# Patient Record
Sex: Female | Born: 1935 | Race: Black or African American | Hispanic: No | Smoking: Never smoker
Health system: Southern US, Community
[De-identification: ages and names within clinical notes are randomized; demographics above are authoritative.]

## PROBLEM LIST (undated history)

## (undated) DIAGNOSIS — Z950 Presence of cardiac pacemaker: Secondary | ICD-10-CM

## (undated) DIAGNOSIS — I442 Atrioventricular block, complete: Secondary | ICD-10-CM

## (undated) DIAGNOSIS — IMO0001 Reserved for inherently not codable concepts without codable children: Secondary | ICD-10-CM

## (undated) DIAGNOSIS — I454 Nonspecific intraventricular block: Secondary | ICD-10-CM

## (undated) DIAGNOSIS — K219 Gastro-esophageal reflux disease without esophagitis: Secondary | ICD-10-CM

## (undated) DIAGNOSIS — Z531 Procedure and treatment not carried out because of patient's decision for reasons of belief and group pressure: Secondary | ICD-10-CM

## (undated) HISTORY — PX: HERNIA REPAIR: SHX51

## (undated) HISTORY — PX: NO PAST SURGERIES: SHX2092

---

## 2012-03-18 ENCOUNTER — Encounter (HOSPITAL_COMMUNITY): Payer: Self-pay | Admitting: Family

## 2012-03-18 ENCOUNTER — Inpatient Hospital Stay (HOSPITAL_COMMUNITY)
Admission: AD | Admit: 2012-03-18 | Discharge: 2012-03-19 | DRG: 243 | Disposition: A | Discharge status: Home / Self Care | Payer: Medicare Other | Source: Other Acute Inpatient Hospital | Attending: Cardiology | Admitting: Cardiology

## 2012-03-18 ENCOUNTER — Encounter (HOSPITAL_COMMUNITY)
Admission: AD | Disposition: A | Discharge status: Home / Self Care | Payer: Self-pay | Source: Other Acute Inpatient Hospital | Attending: Cardiology

## 2012-03-18 DIAGNOSIS — I442 Atrioventricular block, complete: Principal | ICD-10-CM

## 2012-03-18 DIAGNOSIS — I454 Nonspecific intraventricular block: Secondary | ICD-10-CM

## 2012-03-18 DIAGNOSIS — Z950 Presence of cardiac pacemaker: Secondary | ICD-10-CM

## 2012-03-18 DIAGNOSIS — E46 Unspecified protein-calorie malnutrition: Secondary | ICD-10-CM | POA: Diagnosis present

## 2012-03-18 DIAGNOSIS — K219 Gastro-esophageal reflux disease without esophagitis: Secondary | ICD-10-CM | POA: Diagnosis present

## 2012-03-18 DIAGNOSIS — Z79899 Other long term (current) drug therapy: Secondary | ICD-10-CM

## 2012-03-18 DIAGNOSIS — I447 Left bundle-branch block, unspecified: Secondary | ICD-10-CM | POA: Diagnosis present

## 2012-03-18 DIAGNOSIS — Z681 Body mass index (BMI) 19 or less, adult: Secondary | ICD-10-CM

## 2012-03-18 HISTORY — PX: PERMANENT PACEMAKER INSERTION: SHX5480

## 2012-03-18 HISTORY — DX: Nonspecific intraventricular block: I45.4

## 2012-03-18 HISTORY — DX: Procedure and treatment not carried out because of patient's decision for reasons of belief and group pressure: Z53.1

## 2012-03-18 HISTORY — DX: Reserved for inherently not codable concepts without codable children: IMO0001

## 2012-03-18 HISTORY — DX: Atrioventricular block, complete: I44.2

## 2012-03-18 HISTORY — DX: Gastro-esophageal reflux disease without esophagitis: K21.9

## 2012-03-18 HISTORY — DX: Presence of cardiac pacemaker: Z95.0

## 2012-03-18 HISTORY — PX: PACEMAKER INSERTION: SHX728

## 2012-03-18 LAB — CARDIAC PANEL(CRET KIN+CKTOT+MB+TROPI)
CK, MB: 6.5 ng/mL (ref 0.3–4.0)
CK, MB: 8.4 ng/mL (ref 0.3–4.0)
Total CK: 82 U/L (ref 7–177)
Troponin I: <0.3 ng/mL (ref <0.30)
Troponin I: <0.3 ng/mL (ref <0.30)

## 2012-03-18 LAB — MAGNESIUM: Magnesium: 1.9 mg/dL (ref 1.5–2.5)

## 2012-03-18 LAB — PACEMAKER DEVICE OBSERVATION

## 2012-03-18 LAB — T3: T3, Total: 93.5 ng/dl (ref 80.0–204.0)

## 2012-03-18 SURGERY — PERMANENT PACEMAKER INSERTION
Anesthesia: LOCAL

## 2012-03-18 MED ORDER — PANTOPRAZOLE SODIUM 40 MG PO TBEC
40.0000 mg | DELAYED_RELEASE_TABLET | Freq: Every day | ORAL | Status: DC
  Administered 2012-03-18: 23:00:00 40 mg via ORAL
  Filled 2012-03-18: qty 1

## 2012-03-18 MED ORDER — ONDANSETRON HCL 4 MG/2ML IJ SOLN: 4.0000 mg | Freq: Four times a day (QID) | INTRAMUSCULAR | Status: DC | PRN

## 2012-03-18 MED ORDER — ACETAMINOPHEN 325 MG PO TABS: 650.0000 mg | ORAL_TABLET | ORAL | Status: DC | PRN

## 2012-03-18 MED ORDER — SODIUM CHLORIDE 0.9 % IV SOLN: 250.0000 mL | INTRAVENOUS | Status: DC

## 2012-03-18 MED ORDER — MIDAZOLAM HCL 5 MG/5ML IJ SOLN
INTRAMUSCULAR | Status: AC
  Filled 2012-03-18: qty 5

## 2012-03-18 MED ORDER — CEFAZOLIN SODIUM 1-5 GM-% IV SOLN
INTRAVENOUS | Status: AC
  Administered 2012-03-18: 15:00:00
  Filled 2012-03-18: qty 100

## 2012-03-18 MED ORDER — CHLORHEXIDINE GLUCONATE 4 % EX LIQD: 60.0000 mL | Freq: Once | CUTANEOUS | Status: DC

## 2012-03-18 MED ORDER — FENTANYL CITRATE 0.05 MG/ML IJ SOLN
INTRAMUSCULAR | Status: AC
  Filled 2012-03-18: qty 2

## 2012-03-18 MED ORDER — ACETAMINOPHEN 325 MG PO TABS
ORAL_TABLET | ORAL | Status: AC
  Filled 2012-03-18: qty 1

## 2012-03-18 MED ORDER — ACETAMINOPHEN 325 MG PO TABS
325.0000 mg | ORAL_TABLET | ORAL | Status: DC | PRN
  Administered 2012-03-18: 325 mg via ORAL
  Filled 2012-03-18: qty 2

## 2012-03-18 MED ORDER — SODIUM CHLORIDE 0.9 % IJ SOLN: 3.0000 mL | INTRAMUSCULAR | Status: DC | PRN

## 2012-03-18 MED ORDER — SODIUM CHLORIDE 0.45 % IV SOLN: INTRAVENOUS | Status: DC

## 2012-03-18 MED ORDER — SODIUM CHLORIDE 0.9 % IJ SOLN
3.0000 mL | Freq: Two times a day (BID) | INTRAMUSCULAR | Status: DC
  Administered 2012-03-18: 3 mL via INTRAVENOUS

## 2012-03-18 MED ORDER — CEFAZOLIN SODIUM-DEXTROSE 2-3 GM-% IV SOLR
2.0000 g | INTRAVENOUS | Status: DC
  Filled 2012-03-18: qty 50

## 2012-03-18 MED ORDER — SODIUM CHLORIDE 0.9 % IR SOLN
80.0000 mg | Status: DC
  Filled 2012-03-18: qty 2

## 2012-03-18 MED ORDER — OMEPRAZOLE MAGNESIUM 20 MG PO TBEC: 20.0000 mg | DELAYED_RELEASE_TABLET | Freq: Every day | ORAL | Status: DC

## 2012-03-18 MED ORDER — LIDOCAINE HCL (PF) 1 % IJ SOLN
INTRAMUSCULAR | Status: AC
  Filled 2012-03-18: qty 60

## 2012-03-18 MED ORDER — CEFAZOLIN SODIUM-DEXTROSE 2-3 GM-% IV SOLR
2.0000 g | Freq: Four times a day (QID) | INTRAVENOUS | Status: DC
  Administered 2012-03-18 – 2012-03-19 (×2): 2 g via INTRAVENOUS
  Filled 2012-03-18 (×3): qty 50

## 2012-03-18 MED ORDER — POTASSIUM CHLORIDE 10 MEQ/100ML IV SOLN
10.0000 meq | Freq: Once | INTRAVENOUS | Status: AC
  Administered 2012-03-18: 10 meq via INTRAVENOUS
  Filled 2012-03-18: qty 100

## 2012-03-18 NOTE — Care Management Note (Signed)
    Page 1 of 1   03/18/2012     11:45:41 AM   CARE MANAGEMENT NOTE 03/18/2012  Patient:  JETAIME, PINNIX   Account Number:  [id number]  Date Initiated:  03/18/2012  Documentation initiated by:  Junius Creamer  Subjective/Objective Assessment:   adm w brady rate in 20-30's     Action/Plan:   lives w da   Anticipated DC Date:     Anticipated DC Plan:        DC Planning Services  CM consult      Choice offered to / List presented to:             Status of service:   Medicare Important Message given?   (If response is "NO", the following Medicare IM given date fields will be blank) Date Medicare IM given:   Date Additional Medicare IM given:    Discharge Disposition:    Per UR Regulation:  Reviewed for med. necessity/level of care/duration of stay  If discussed at Long Length of Stay Meetings, dates discussed:    Comments:  7/18 11:42a debbie Aariyah Sampey rn,bsn [phone]

## 2012-03-18 NOTE — Op Note (Signed)
DDD PPM insertion via the left subclavian vein without immediate complication. Dictated.

## 2012-03-18 NOTE — H&P (Signed)
ELECTROPHYSIOLOGY ADMISSION HISTORY & PHYSICAL  Patient ID: Betty [name] MRN: [id number], DOB/AGE: 76-Oct-1916   Date of Admission: 03/18/2012  Primary Physician: None Primary Cardiologist: New to  Reason for Admission: Complete heart block  History of Present Illness Betty [name] is a pleasant 78 year old woman with no prior cardiac history who has been accepted in transfer from [facility] for further evaluation of bradycardia. She is accompanied by her daughter, with whom she lives, who assists with history questions. Betty [name] denies any chronic medical conditions, specifically CAD/MI, valvular heart disease, CHF, thyroid dysfunction or prior heart rhythm abnormality. She was feeling like her usual self until ~3:00 AM today. She reports she was getting ready for bed (she often stays up late at night) when she felt weak. Her symptoms started abruptly and were brief in duration. She denies CP, SOB or palpitations. She denies syncope. She has never experienced symptoms similar to this before. She denies any exertional symptoms, specifically CP, SOB, weakness/dizziness or palpitations. She called for her daughter who activated EMS. According to the report from [facility], EMS found her to be bradycardic with rates in the 20s-30s. EMS strips show complete heart block. Currently, she is in sinus rhythm with PACs. She has a LBBB, ? new. There are no old ECGs available for comparison. Currently, she has no complaints and states she is feeling like her usual self again.  Past Medical History 1. GERD with intermittent dysphagia 2. Umbilical hernia  Past Surgical History None    Allergies/Intolerances No known drug or food allergies  Home Medications Prilosec OTC daily Vitamin C daily Vitamin B12 daily Vitamin B6 daily  Family History Positive for CAD   Social History Denies tobacco or alcohol use. Lives with daughter. Performs ADLs independently.   Review of  Systems General: No chills, fever, night sweats or unexplained weight changes.  Cardiovascular: No chest pain, dyspnea on exertion, edema, orthopnea, palpitations, paroxysmal nocturnal dyspnea. Dermatological: No rash, lesions or masses. Respiratory: No cough, dyspnea. Urologic: No hematuria, dysuria. Abdominal: Positive for intermittent dysphagia "feels like food gets stuck", no choking sensation.No nausea, vomiting, diarrhea, bright red blood per rectum, melena, or hematemesis. Neurologic: No visual changes, weakness, changes in mental status. All other systems reviewed and are otherwise negative except as noted above.  Physical Exam Blood pressure 153/64, pulse 83, temperature 98.2 F (36.8 C), temperature source Oral, resp. rate 18, SpO2 99.00%.  General: Well developed, well appearing, thin, elderly 78 year old female in no acute distress. HEENT: Normocephalic, atraumatic. EOMs intact. Sclera nonicteric. Oropharynx clear.  Neck: Supple without bruits. No JVD. Lungs:  Respirations regular and unlabored, CTA bilaterally. No wheezes, rales or rhonchi. Heart: Occasionally irregular. S1, S2 present. No murmurs, rub, S3 or S4. Abdomen: Soft, non-tender, non-distended. BS present x 4 quadrants. No hepatosplenomegaly.  Extremities: No clubbing, cyanosis or edema. DP/PT/Radials 2+ and equal bilaterally. Psych: Normal affect. Neuro: Alert and oriented X 3. Moves all extremities spontaneously. No focal deficits.   Labs Labs from [facility] - WBC 7000, Hgb 11.6, Hct 35, PLT 201,000 Sodium 143, potassium 3.2, chloride 105, bicarbonate 30, BUN 18, creatinine 1.1  CK 68, CK-MB 3.9, troponin 0.03 TSH 10.54  Radiology/Studies No results found  Echocardiogram pending 12-lead ECG shows normal sinus rhythm with LBBB, PACs Telemetry currently shows sinus rhythm with frequent PACs, underlying LBBB  Assessment and Plan 1. Complete heart block 2. LBBB 3. Elevated TSH, mild 4.  Intermittent dysphagia  Betty [name] will be admitted to CCU  with routine labs obtained including complete thyroid panel. We will order an echocardiogram to evaluate heart structure/valves and wall motion. We will place transcutaneous pacer pads and monitor rhythm. Her LBBB may or may not be new. There are no old ECGs available for review. She does not report any symptoms concerning for angina, ? need for ischemic evaluation. She will need a dual chamber PPM. Risks, benefits and alternatives to PPM implantation were discussed in detail with the patient today. These risks include, but are not limited to, bleeding, infection, pneumothorax, perforation, tamponade, vascular damage, renal failure, lead dislodgement, MI, stroke and death. The patient expressed verbal understanding and agrees with this plan of care. Of note, she was instructed to establish care with PCP for follow-up and further evaluation for dysphagia.   The patient was seen, examined and plan of care formulated with Dr. Lewayne Bunting. Signed, Rick Duff, PA-C 03/18/2012, 10:17 AM   EP Attending  Patient seen and examined. I agree with the exam, assess, and plan. She has intermittent symptomatic complete heart block. I have discussed the risks/benefits/goals/expectations of PPM with the patient and her family and she wishes to proceed.   Lewayne Bunting, [name]

## 2012-03-18 NOTE — Progress Notes (Signed)
CRITICAL VALUE ALERT  Critical value received:  CKMB 6.5  Date of notification:  03/18/2012  Time of notification:  1220  Critical value read back:yes  Nurse who received alert:  Malachy Chamber, RN   MD notified (1st page):   Time of first page:   MD notified (2nd page):  Time of second page:  Responding MD:    Time MD responded:

## 2012-03-18 NOTE — Interval H&P Note (Signed)
History and Physical Interval Note:  03/18/2012 1:54 PM  Betty [name]  has presented today for surgery, with the diagnosis of heart block  The various methods of treatment have been discussed with the patient and family. After consideration of risks, benefits and other options for treatment, the patient has consented to  Procedure(s) (LRB): PERMANENT PACEMAKER INSERTION (N/A) as a surgical intervention .  The patient's history has been reviewed, patient examined, no change in status, stable for surgery.  I have reviewed the patients' chart and labs.  Questions were answered to the patient's satisfaction.     Buel Ream.D.

## 2012-03-19 ENCOUNTER — Inpatient Hospital Stay (HOSPITAL_COMMUNITY): Payer: Medicare Other

## 2012-03-19 ENCOUNTER — Encounter: Payer: Self-pay | Admitting: *Deleted

## 2012-03-19 DIAGNOSIS — Z950 Presence of cardiac pacemaker: Secondary | ICD-10-CM | POA: Insufficient documentation

## 2012-03-19 DIAGNOSIS — I442 Atrioventricular block, complete: Principal | ICD-10-CM

## 2012-03-19 LAB — BASIC METABOLIC PANEL
BUN: 13 mg/dL (ref 6–23)
Chloride: 102 mEq/L (ref 96–112)
Creatinine, Ser: 0.7 mg/dL (ref 0.50–1.10)
GFR calc Af Amer: 83 mL/min — ABNORMAL LOW (ref >90)

## 2012-03-19 MED ORDER — YOU HAVE A PACEMAKER BOOK
Freq: Once | Status: AC
  Administered 2012-03-19: 08:00:00
  Filled 2012-03-19: qty 1

## 2012-03-19 MED ORDER — CHLORHEXIDINE GLUCONATE CLOTH 2 % EX PADS: 6.0000 | MEDICATED_PAD | Freq: Every day | CUTANEOUS | Status: DC

## 2012-03-19 MED ORDER — MUPIROCIN 2 % EX OINT
1.0000 "application " | TOPICAL_OINTMENT | Freq: Two times a day (BID) | CUTANEOUS | Status: DC
  Filled 2012-03-19: qty 22

## 2012-03-19 NOTE — Discharge Summary (Addendum)
   ELECTROPHYSIOLOGY DISCHARGE SUMMARY    Patient ID: Betty [name],  MRN: [id number], DOB/AGE: October 17, 1914 78 y.o.  Admit date: 03/18/2012 Discharge date: 03/19/2012  Primary Care Physician: None Primary Cardiologist: Dr. Lewayne Bunting  Primary Discharge Diagnosis:  1. Complete heart block s/p PPM implantation  Secondary Discharge Diagnoses:  1. GERD with intermittent dysphagia  Procedures This Admission:  1. Dual chamber PPM implantation Medtronic model 5076, 52 cm active fixation pacing lead, serial #[id number] was advanced into the right ventricle and the Medtronic model 5076, 45 cm active fixation pacing lead, serial #[id number] was advanced to the right atrium. Medtronic Adapta dual-chamber pacemaker serial [id number].  History and Hospital Course:  Betty [name] is a pleasant 78 year old woman with no prior cardiac history who was admitted yesterday with symptomatic, intermittent complete heart block. There were no reversible causes identified. Her cardiac enzymes were negative. Her thyroid studies were normal. She underwent dual chamber PPM implantation yesterday 07/198/2013. The patient tolerated this procedure well without any immediate complication. She remains hemodynamically stable and afebrile. Her chest xray shows stable lead placement without pneumothorax. Her device interrogation shows normal PPM function with stable lead parameters/measurements. Her implant site is intact without significant bleeding or hematoma. She has been given discharge instructions including wound care and activity restrictions. She will follow-up in 10 days for wound check. There were no changes made to her medications. Of note, she was instructed to establish care with PCP for follow-up and further evaluation for dysphagia. She has been seen, examined and deemed stable for discharge today by Dr. Berton Mount.  Physical Exam: Vitals: Blood pressure 101/57, pulse 71, temperature 98.3 F (36.8 C), temperature  source Oral, resp. rate 18, height 5\' 1"  (1.549 m), weight 92 lb 2.4 oz (41.8 kg), SpO2 98.00%.  General: Well developed, well appearing, elderly 78 year old female in no distress Heart: RRR. S1, S2 without murmur, rub or S3 Lungs: CTA bilaterally without wheeze, rales or rhonchi Extremities: No cyanosis, clubbing or edema Skin: Implant site/left upper chest intact without significant bleeding or hematoma  Labs:    Lab 03/19/12 0642  NA 139  K 3.4*  CL 102  CO2 25  BUN 13  CREATININE 0.70  CALCIUM 9.0  PROT --  BILITOT --  ALKPHOS --  ALT --  AST --  GLUCOSE 186*   Lab Results  Component Value Date   CKTOTAL 172 03/18/2012   CKMB 8.4* 03/18/2012   TROPONINI <0.30 03/18/2012     Disposition:  The patient is being discharged in stable condition.  Follow-up: Follow-up Information    Follow up with Lewayne Bunting, MD on 06/29/2012. (At 11:30 AM)    Contact information:   [facility] office [address]  [address] [place] [address] [phone]    Follow up with [facility] on 03/31/2012. (At 2:00 PM for wound check)    Contact information:   [facility] office [address]  [address] [place] [address] [phone]   Discharge Medications:  Medication List  As of 03/19/2012  8:46 AM   TAKE these medications         omeprazole 20 MG tablet   Commonly known as: PRILOSEC OTC   Take 20 mg by mouth daily.      Duration of Discharge Encounter: Greater than 30 minutes including physician time.  Limmie Patricia, PA-C 03/19/2012, 8:46 AM Sherryl Manges, MD 03/19/2012 8:48 AM

## 2012-03-19 NOTE — Op Note (Signed)
NAMESEQUOIA, WITZ NO.:  [id number]  MEDICAL RECORD NO.:  [id number]  LOCATION:  6529                         FACILITY:  [facility]  PHYSICIAN:  Doylene Canning. Ladona Ridgel, MD    DATE OF BIRTH:  1915/01/15  DATE OF PROCEDURE:  03/18/2012 DATE OF DISCHARGE:                              OPERATIVE REPORT   PROCEDURE PERFORMED:  Insertion of a dual-chamber pacemaker.  INDICATION:  Symptomatic intermittent complete heart block.  INTRODUCTION:  The patient is a very pleasant 78 year old woman with a history of symptomatic bradycardia and left bundle-branch block.  She developed complete heart block with symptoms and was admitted to the hospital for additional evaluation and treatment.  She is on no AV nodal blocking drugs.  DESCRIPTION OF PROCEDURE:  After informed consent was obtained, the patient was taken to the diagnostic EP Lab in a fasting state.  After usual preparation and draping, intravenous fentanyl and midazolam were given for sedation.  A 30 mL of lidocaine was infiltrated into the left infraclavicular region and a 5-cm incision was carried out over this region.  Electrocautery was utilized to dissect down to the fascial plane.  Initial attempts to puncture the subclavian vein were unsuccessful.  A 10 mL of contrast was injected into the left subclavian vein, which demonstrated a very small vein.  It was then successfully punctured and a Medtronic model 5076, 52 cm active fixation pacing lead, serial #[id number] was advanced into the right ventricle and the Medtronic model 5076, 45 cm active fixation pacing lead, serial #[id number] was advanced to the right atrium.  Mapping was carried out in the right ventricle.  At the final site, the R-waves measured 20 mV and the pacing threshold was a V at 0.5 msec.  Pacing impedance was 700 ohms.  With the ventricular lead in satisfactory position, attention then turned to placement of atrial lead.  It was placed in  anterolateral portion of the right atrium where P-waves measured 3 mV and pacing impedance was 600 ohms.  The threshold was a V at 0.5 msec.  Prominent injury current was present with both atrial and ventricular lead insertion and 10 V pacing did not stimulate the diaphragm, and either the atrium or the ventricle.  With these satisfactory parameters, the leads were secured to the subpectoral fascia with a figure-of-eight silk suture.  The sewing sleeve was secured with silk suture.  At this point, electrocautery was utilized to dissect down to the subpectoral pocket. It should be also noted that blunt dissection was utilized in conjunction with electrocautery.  After this was accomplished, the Medtronic adapter dual-chamber pacemaker serial [id number] was connected to the atrial and ventricular leads and placed in the subpectoralis muscle pocket.  The pocket was irrigated.  The muscular and subcuticular and subcu layers were reapproximated with silk suture. Benzoin and Steri-Strips were painted on the skin, a pressure dressing applied.  The patient was returned to her room in satisfactory condition.  COMPLICATIONS:  There were no immediate complications.  RESULTS:  Demonstrate successful implantation of a Medtronic dual- chamber pacemaker in a patient with symptomatic bradycardia.     Doylene Canning. Ladona Ridgel, MD  GWT/MEDQ  D:  03/18/2012  T:  03/19/2012  Job:  [id number]

## 2012-03-31 ENCOUNTER — Ambulatory Visit: Payer: Medicare Other

## 2012-03-31 ENCOUNTER — Ambulatory Visit (INDEPENDENT_AMBULATORY_CARE_PROVIDER_SITE_OTHER): Payer: Medicare Other | Admitting: Cardiology

## 2012-03-31 ENCOUNTER — Encounter: Payer: Self-pay | Admitting: Cardiology

## 2012-03-31 ENCOUNTER — Encounter: Payer: Self-pay | Admitting: Internal Medicine

## 2012-03-31 VITALS — BP 132/77 | HR 73 | Ht 60.0 in | Wt 75.0 lb

## 2012-03-31 DIAGNOSIS — Z950 Presence of cardiac pacemaker: Secondary | ICD-10-CM

## 2012-03-31 DIAGNOSIS — I442 Atrioventricular block, complete: Secondary | ICD-10-CM

## 2012-03-31 DIAGNOSIS — R0989 Other specified symptoms and signs involving the circulatory and respiratory systems: Secondary | ICD-10-CM

## 2012-03-31 LAB — PACEMAKER DEVICE OBSERVATION
AL AMPLITUDE: 2.8 mv
ATRIAL PACING PM: 13.8
BATTERY VOLTAGE: 2.8 V
VENTRICULAR PACING PM: 0.1

## 2012-03-31 NOTE — Progress Notes (Signed)
Wound check only.  See PaceArt report.

## 2012-04-21 ENCOUNTER — Encounter: Payer: Self-pay | Admitting: Internal Medicine

## 2012-06-24 ENCOUNTER — Encounter: Payer: Medicare Other | Admitting: Internal Medicine

## 2012-06-29 ENCOUNTER — Encounter: Payer: Self-pay | Admitting: Internal Medicine

## 2012-06-29 ENCOUNTER — Ambulatory Visit (INDEPENDENT_AMBULATORY_CARE_PROVIDER_SITE_OTHER): Payer: Medicare Other | Admitting: Internal Medicine

## 2012-06-29 VITALS — BP 119/62 | HR 78 | Ht 61.0 in | Wt 95.0 lb

## 2012-06-29 DIAGNOSIS — I441 Atrioventricular block, second degree: Secondary | ICD-10-CM

## 2012-06-29 DIAGNOSIS — Z950 Presence of cardiac pacemaker: Secondary | ICD-10-CM

## 2012-06-29 LAB — PACEMAKER DEVICE OBSERVATION
AL AMPLITUDE: 4 mv
AL THRESHOLD: 0.5 V
BAMS-0001: 150 {beats}/min
RV LEAD AMPLITUDE: 31.36 mv
RV LEAD IMPEDENCE PM: 463 Ohm

## 2012-06-29 NOTE — Progress Notes (Signed)
HPI Betty [name] returns today for followup. She is a very pleasant 78 year old woman with a history of complete heart block, status post permanent pacemaker insertion. The patient notes that she has some soreness at her insertion site for a couple of weeks after her pacemaker placement. This is now resolved, and she feels like she has no device in whatsoever. She has had no recurrent syncope. She denies chest pain or shortness of breath or peripheral edema. No Known Allergies   Current Outpatient Prescriptions  Medication Sig Dispense Refill  . vitamin C (ASCORBIC ACID) 500 MG tablet Take 500 mg by mouth daily.         Past Medical History  Diagnosis Date  . GERD (gastroesophageal reflux disease)   . Complete AV block 03/18/12  . BBB (bundle branch block) 03/18/12    left  . Pacemaker   . Refusal of blood transfusions as patient is Jehovah's Witness     ROS:   All systems reviewed and negative except as noted in the HPI.   Past Surgical History  Procedure Date  . No past surgeries   . Pacemaker insertion 03/18/12    initial placement     No family history on file.   History   Social History  . Marital Status: Widowed    Spouse Name: N/A    Number of Children: N/A  . Years of Education: N/A   Occupational History  . Not on file.   Social History Main Topics  . Smoking status: Never Smoker   . Smokeless tobacco: Never Used  . Alcohol Use: No  . Drug Use: No  . Sexually Active: No   Other Topics Concern  . Not on file   Social History Narrative  . No narrative on file     BP 119/62  Pulse 78  Ht 5\' 1"  (1.549 m)  Wt 95 lb (43.092 kg)  BMI 17.95 kg/m2  SpO2 99%  Physical Exam:  Well appearing elderly woman, NAD HEENT: Unremarkable Neck:  No JVD, no thyromegally Lungs:  Clear with no wheezes, rales, or rhonchi. HEART:  Regular rate rhythm, no murmurs, no rubs, no clicks Abd:  soft, positive bowel sounds, no organomegally, no rebound, no  guarding Ext:  2 plus pulses, no edema, no cyanosis, no clubbing Skin:  No rashes no nodules Neuro:  CN II through XII intact, motor grossly intact  DEVICE  Normal device function.  See PaceArt for details.   Assess/Plan:

## 2012-06-29 NOTE — Assessment & Plan Note (Signed)
Her Medtronic dual-chamber pacemaker is working normally. We'll plan to recheck in several months. 

## 2012-06-29 NOTE — Patient Instructions (Signed)
Your physician wants you to follow-up in: 9 months with Dr Taylor.  You will receive a reminder letter in the mail two months in advance. If you don't receive a letter, please call our office to schedule the follow-up appointment.  

## 2012-06-29 NOTE — Assessment & Plan Note (Signed)
She has become asymptomatic since her pacemaker was placed.

## 2013-02-08 ENCOUNTER — Encounter (HOSPITAL_COMMUNITY): Payer: Self-pay | Admitting: *Deleted

## 2013-02-08 ENCOUNTER — Emergency Department (HOSPITAL_COMMUNITY): Payer: Medicare Other

## 2013-02-08 ENCOUNTER — Inpatient Hospital Stay (HOSPITAL_COMMUNITY)
Admission: EM | Admit: 2013-02-08 | Discharge: 2013-03-01 | DRG: 344 | Disposition: A | Discharge status: Home / Self Care | Payer: Medicare Other | Attending: Surgery | Admitting: Surgery

## 2013-02-08 DIAGNOSIS — I454 Nonspecific intraventricular block: Secondary | ICD-10-CM | POA: Diagnosis present

## 2013-02-08 DIAGNOSIS — K450 Other specified abdominal hernia with obstruction, without gangrene: Principal | ICD-10-CM | POA: Diagnosis present

## 2013-02-08 DIAGNOSIS — E46 Unspecified protein-calorie malnutrition: Secondary | ICD-10-CM | POA: Diagnosis present

## 2013-02-08 DIAGNOSIS — K460 Unspecified abdominal hernia with obstruction, without gangrene: Secondary | ICD-10-CM

## 2013-02-08 DIAGNOSIS — I441 Atrioventricular block, second degree: Secondary | ICD-10-CM

## 2013-02-08 DIAGNOSIS — R5381 Other malaise: Secondary | ICD-10-CM | POA: Diagnosis present

## 2013-02-08 DIAGNOSIS — D62 Acute posthemorrhagic anemia: Secondary | ICD-10-CM | POA: Diagnosis not present

## 2013-02-08 DIAGNOSIS — E039 Hypothyroidism, unspecified: Secondary | ICD-10-CM | POA: Diagnosis present

## 2013-02-08 DIAGNOSIS — K219 Gastro-esophageal reflux disease without esophagitis: Secondary | ICD-10-CM | POA: Diagnosis present

## 2013-02-08 DIAGNOSIS — J95821 Acute postprocedural respiratory failure: Secondary | ICD-10-CM | POA: Diagnosis not present

## 2013-02-08 DIAGNOSIS — I442 Atrioventricular block, complete: Secondary | ICD-10-CM | POA: Diagnosis present

## 2013-02-08 DIAGNOSIS — K4190 Unilateral femoral hernia, without obstruction or gangrene, not specified as recurrent: Secondary | ICD-10-CM | POA: Diagnosis present

## 2013-02-08 DIAGNOSIS — E8770 Fluid overload, unspecified: Secondary | ICD-10-CM

## 2013-02-08 DIAGNOSIS — R109 Unspecified abdominal pain: Secondary | ICD-10-CM

## 2013-02-08 DIAGNOSIS — I509 Heart failure, unspecified: Secondary | ICD-10-CM

## 2013-02-08 DIAGNOSIS — E871 Hypo-osmolality and hyponatremia: Secondary | ICD-10-CM | POA: Diagnosis not present

## 2013-02-08 DIAGNOSIS — K559 Vascular disorder of intestine, unspecified: Secondary | ICD-10-CM | POA: Diagnosis present

## 2013-02-08 DIAGNOSIS — E876 Hypokalemia: Secondary | ICD-10-CM | POA: Diagnosis not present

## 2013-02-08 DIAGNOSIS — N19 Unspecified kidney failure: Secondary | ICD-10-CM | POA: Diagnosis not present

## 2013-02-08 DIAGNOSIS — Z950 Presence of cardiac pacemaker: Secondary | ICD-10-CM

## 2013-02-08 DIAGNOSIS — D638 Anemia in other chronic diseases classified elsewhere: Secondary | ICD-10-CM | POA: Diagnosis present

## 2013-02-08 DIAGNOSIS — I5031 Acute diastolic (congestive) heart failure: Secondary | ICD-10-CM

## 2013-02-08 DIAGNOSIS — K560 Paralytic ileus: Secondary | ICD-10-CM | POA: Diagnosis not present

## 2013-02-08 DIAGNOSIS — J9600 Acute respiratory failure, unspecified whether with hypoxia or hypercapnia: Secondary | ICD-10-CM

## 2013-02-08 DIAGNOSIS — K222 Esophageal obstruction: Secondary | ICD-10-CM

## 2013-02-08 DIAGNOSIS — A419 Sepsis, unspecified organism: Secondary | ICD-10-CM

## 2013-02-08 DIAGNOSIS — G9340 Encephalopathy, unspecified: Secondary | ICD-10-CM

## 2013-02-08 DIAGNOSIS — R6520 Severe sepsis without septic shock: Secondary | ICD-10-CM | POA: Diagnosis not present

## 2013-02-08 DIAGNOSIS — R630 Anorexia: Secondary | ICD-10-CM | POA: Diagnosis present

## 2013-02-08 DIAGNOSIS — IMO0002 Reserved for concepts with insufficient information to code with codable children: Secondary | ICD-10-CM

## 2013-02-08 DIAGNOSIS — K429 Umbilical hernia without obstruction or gangrene: Secondary | ICD-10-CM | POA: Diagnosis present

## 2013-02-08 DIAGNOSIS — E870 Hyperosmolality and hypernatremia: Secondary | ICD-10-CM

## 2013-02-08 DIAGNOSIS — D500 Iron deficiency anemia secondary to blood loss (chronic): Secondary | ICD-10-CM | POA: Diagnosis not present

## 2013-02-08 DIAGNOSIS — K439 Ventral hernia without obstruction or gangrene: Secondary | ICD-10-CM | POA: Diagnosis present

## 2013-02-08 DIAGNOSIS — E43 Unspecified severe protein-calorie malnutrition: Secondary | ICD-10-CM | POA: Diagnosis present

## 2013-02-08 DIAGNOSIS — R739 Hyperglycemia, unspecified: Secondary | ICD-10-CM

## 2013-02-08 DIAGNOSIS — I421 Obstructive hypertrophic cardiomyopathy: Secondary | ICD-10-CM | POA: Diagnosis present

## 2013-02-08 DIAGNOSIS — R7309 Other abnormal glucose: Secondary | ICD-10-CM | POA: Diagnosis present

## 2013-02-08 LAB — CBC WITH DIFFERENTIAL/PLATELET
Basophils Absolute: 0 10*3/uL (ref 0.0–0.1)
Eosinophils Absolute: 0 10*3/uL (ref 0.0–0.7)
Eosinophils Relative: 0 % (ref 0–5)
MCH: 28.1 pg (ref 26.0–34.0)
MCHC: 33.9 g/dL (ref 30.0–36.0)
MCV: 83 fL (ref 78.0–100.0)
Platelets: 198 10*3/uL (ref 150–400)
RDW: 14.6 % (ref 11.5–15.5)

## 2013-02-08 LAB — COMPREHENSIVE METABOLIC PANEL
AST: 25 U/L (ref 0–37)
Albumin: 2.9 g/dL — ABNORMAL LOW (ref 3.5–5.2)
Alkaline Phosphatase: 57 U/L (ref 39–117)
Chloride: 95 mEq/L — ABNORMAL LOW (ref 96–112)
Potassium: 3.5 mEq/L (ref 3.5–5.1)
Total Bilirubin: 0.6 mg/dL (ref 0.3–1.2)

## 2013-02-08 MED ORDER — DEXTROSE 5 % IV SOLN
2.0000 g | INTRAVENOUS | Status: AC
  Administered 2013-02-08: 2 g via INTRAVENOUS
  Filled 2013-02-08: qty 2

## 2013-02-08 MED ORDER — DEXTROSE-NACL 5-0.9 % IV SOLN
INTRAVENOUS | Status: DC
  Administered 2013-02-08: 22:00:00 via INTRAVENOUS

## 2013-02-08 NOTE — ED Notes (Signed)
The pt was transferred from [facility] [place].  shewas sent here for a bowel obstruction. Iv.  No pain or nausea med given.  To see dr Luisa Hart

## 2013-02-08 NOTE — Preoperative (Signed)
Beta Blockers   Reason not to administer Beta Blockers:Not Applicable 

## 2013-02-08 NOTE — ED Notes (Signed)
Dr Luisa Hart aware that the NG was not able to be completed.  Attempted 4 times (14 and 16 FR).

## 2013-02-08 NOTE — H&P (Signed)
Betty [name] is an 79 y.o. female.   Chief Complaint: abdominal pain HPI: Pt transferred from [facility] due to 1 day Hx of abdominal pain.  Diffuse constant and crampy.  No vomiting.  Has issues with constipation.  No nausea.  Takes no medications.  CT done and shows SBO with incarcerated right obturator hernia,  Left femoral hernia and ventral hernia.  Pt has elevated WBC to 13,000.  No fever or chills.    Past Medical History  Diagnosis Date  . GERD (gastroesophageal reflux disease)   . Complete AV block 03/18/12  . BBB (bundle branch block) 03/18/12    left  . Pacemaker   . Refusal of blood transfusions as patient is Jehovah's Witness     Past Surgical History  Procedure Laterality Date  . No past surgeries    . Pacemaker insertion  03/18/12    initial placement    No family history on file. Social History:  reports that she has never smoked. She has never used smokeless tobacco. She reports that she does not drink alcohol or use illicit drugs.  Allergies: No Known Allergies   (Not in a hospital admission)  No results found for this or any previous visit (from the past 48 hour(s)). No results found.  Review of Systems  Constitutional: Negative for fever and chills.  HENT: Negative.   Eyes: Negative.   Respiratory: Negative.   Cardiovascular: Negative.   Gastrointestinal: Negative.   Genitourinary: Negative.   Musculoskeletal: Negative.   Skin: Negative.   Neurological: Positive for weakness.  Endo/Heme/Allergies: Negative.   Psychiatric/Behavioral: Negative.     Blood pressure 135/75, pulse 86, temperature 98.1 F (36.7 C), temperature source Oral, resp. rate 18, SpO2 98.00%. Physical Exam  Constitutional: She is oriented to person, place, and time. No distress.  HENT:  Head: Normocephalic and atraumatic.  Cardiovascular: Regular rhythm.   Murmur heard.  Systolic murmur is present with a grade of 3/6  Respiratory: Breath sounds normal. No respiratory  distress.  GI: She exhibits distension and mass. There is tenderness. There is no rigidity, no rebound and no guarding. A hernia is present. Hernia confirmed positive in the ventral area.    Musculoskeletal: Normal range of motion.  Neurological: She is alert and oriented to person, place, and time.  Skin: Skin is warm and dry.  Psychiatric: She has a normal mood and affect. Her behavior is normal. Judgment and thought content normal.     Assessment/Plan Small bowel obstruction secondary to incarcerated right obturator hernia and multiple other hernia.With elevated WBC and probable incarceration ex lap recommended.  Risk of surgery and no surgery discussed with pt.  She is able to make her own decisions.  Risk of bleeding,  Infection,  Death,  Wound problems,  DVT,  Pulmonary issues,  Cardiac issues,  Bowel resection, ostomy,  And other procedures possible.  Pt agrees to proceed.  [name] 02/08/2013, 9:54 PM

## 2013-02-08 NOTE — ED Notes (Signed)
Patient taken to the OR

## 2013-02-08 NOTE — ED Notes (Signed)
Family has been present in the room

## 2013-02-08 NOTE — ED Notes (Signed)
Dentures and all jewelry given to [employer].

## 2013-02-09 ENCOUNTER — Inpatient Hospital Stay (HOSPITAL_COMMUNITY): Payer: Medicare Other

## 2013-02-09 ENCOUNTER — Other Ambulatory Visit: Payer: Self-pay

## 2013-02-09 ENCOUNTER — Encounter (HOSPITAL_COMMUNITY): Admission: EM | Disposition: A | Discharge status: Home / Self Care | Payer: Self-pay | Source: Home / Self Care

## 2013-02-09 ENCOUNTER — Encounter (HOSPITAL_COMMUNITY): Payer: Self-pay | Admitting: Certified Registered Nurse Anesthetist

## 2013-02-09 ENCOUNTER — Emergency Department (HOSPITAL_COMMUNITY): Payer: Medicare Other | Admitting: Certified Registered Nurse Anesthetist

## 2013-02-09 DIAGNOSIS — J9600 Acute respiratory failure, unspecified whether with hypoxia or hypercapnia: Secondary | ICD-10-CM

## 2013-02-09 DIAGNOSIS — K460 Unspecified abdominal hernia with obstruction, without gangrene: Secondary | ICD-10-CM | POA: Diagnosis present

## 2013-02-09 DIAGNOSIS — G9340 Encephalopathy, unspecified: Secondary | ICD-10-CM

## 2013-02-09 DIAGNOSIS — R739 Hyperglycemia, unspecified: Secondary | ICD-10-CM | POA: Diagnosis present

## 2013-02-09 DIAGNOSIS — R7309 Other abnormal glucose: Secondary | ICD-10-CM

## 2013-02-09 DIAGNOSIS — E43 Unspecified severe protein-calorie malnutrition: Secondary | ICD-10-CM | POA: Diagnosis present

## 2013-02-09 HISTORY — PX: LAPAROTOMY: SHX154

## 2013-02-09 LAB — POCT I-STAT 3, ART BLOOD GAS (G3+)
Bicarbonate: 24.1 mEq/L — ABNORMAL HIGH (ref 20.0–24.0)
O2 Saturation: 100 %
O2 Saturation: 99 %
Patient temperature: 98.1
TCO2: 25 mmol/L (ref 0–100)
pCO2 arterial: 42.3 mmHg (ref 35.0–45.0)
pH, Arterial: 7.384 (ref 7.350–7.450)
pO2, Arterial: 221 mmHg — ABNORMAL HIGH (ref 80.0–100.0)
pO2, Arterial: 134 mmHg — ABNORMAL HIGH (ref 80.0–100.0)

## 2013-02-09 LAB — COMPREHENSIVE METABOLIC PANEL
Albumin: 2.2 g/dL — ABNORMAL LOW (ref 3.5–5.2)
Alkaline Phosphatase: 46 U/L (ref 39–117)
BUN: 39 mg/dL — ABNORMAL HIGH (ref 6–23)
Potassium: 3.2 mEq/L — ABNORMAL LOW (ref 3.5–5.1)
Total Protein: 5.7 g/dL — ABNORMAL LOW (ref 6.0–8.3)

## 2013-02-09 LAB — GLUCOSE, CAPILLARY
Glucose-Capillary: 236 mg/dL — ABNORMAL HIGH (ref 70–99)
Glucose-Capillary: 224 mg/dL — ABNORMAL HIGH (ref 70–99)
Glucose-Capillary: 97 mg/dL (ref 70–99)

## 2013-02-09 LAB — CBC
HCT: 38.5 % (ref 36.0–46.0)
MCHC: 33 g/dL (ref 30.0–36.0)
RDW: 14.7 % (ref 11.5–15.5)

## 2013-02-09 LAB — MRSA PCR SCREENING: MRSA by PCR: NEGATIVE

## 2013-02-09 SURGERY — LAPAROTOMY, EXPLORATORY
Anesthesia: General | Site: Abdomen | Wound class: Contaminated

## 2013-02-09 MED ORDER — MIDAZOLAM HCL 2 MG/2ML IJ SOLN
1.0000 mg | INTRAMUSCULAR | Status: DC | PRN
  Administered 2013-02-09: 0.5 mg via INTRAVENOUS
  Filled 2013-02-09: qty 2

## 2013-02-09 MED ORDER — SUFENTANIL CITRATE 50 MCG/ML IV SOLN
INTRAVENOUS | Status: DC | PRN
  Administered 2013-02-09: 20 ug via INTRAVENOUS
  Administered 2013-02-09: 10 ug via INTRAVENOUS

## 2013-02-09 MED ORDER — DEXTROSE 50 % IV SOLN
INTRAVENOUS | Status: AC
  Filled 2013-02-09: qty 50

## 2013-02-09 MED ORDER — PROPOFOL 10 MG/ML IV EMUL: 5.0000 ug/kg/min | INTRAVENOUS | Status: DC

## 2013-02-09 MED ORDER — SUCCINYLCHOLINE CHLORIDE 20 MG/ML IJ SOLN
INTRAMUSCULAR | Status: DC | PRN
  Administered 2013-02-09: 100 mg via INTRAVENOUS

## 2013-02-09 MED ORDER — ALBUTEROL SULFATE HFA 108 (90 BASE) MCG/ACT IN AERS
4.0000 | INHALATION_SPRAY | RESPIRATORY_TRACT | Status: DC | PRN
  Filled 2013-02-09: qty 6.7

## 2013-02-09 MED ORDER — SODIUM CHLORIDE 0.9 % IV BOLUS (SEPSIS)
500.0000 mL | Freq: Once | INTRAVENOUS | Status: AC
  Administered 2013-02-09: 500 mL via INTRAVENOUS

## 2013-02-09 MED ORDER — CEFAZOLIN SODIUM 1-5 GM-% IV SOLN
INTRAVENOUS | Status: DC | PRN
  Administered 2013-02-09: 1 g via INTRAVENOUS

## 2013-02-09 MED ORDER — SODIUM CHLORIDE 0.9 % IV BOLUS (SEPSIS)
1000.0000 mL | Freq: Once | INTRAVENOUS | Status: AC
  Administered 2013-02-09: 1000 mL via INTRAVENOUS

## 2013-02-09 MED ORDER — SODIUM CHLORIDE 0.9 % IV SOLN
25.0000 ug/h | INTRAVENOUS | Status: DC
  Administered 2013-02-09: 50 ug/h via INTRAVENOUS
  Administered 2013-02-10: 25 ug/h via INTRAVENOUS
  Filled 2013-02-09: qty 50

## 2013-02-09 MED ORDER — SODIUM CHLORIDE 0.9 % IV SOLN
10.0000 mg | INTRAVENOUS | Status: DC | PRN
  Administered 2013-02-09: 40 ug/min via INTRAVENOUS

## 2013-02-09 MED ORDER — ONDANSETRON HCL 4 MG/2ML IJ SOLN: 4.0000 mg | Freq: Once | INTRAMUSCULAR | Status: DC | PRN

## 2013-02-09 MED ORDER — FENTANYL BOLUS VIA INFUSION
25.0000 ug | Freq: Four times a day (QID) | INTRAVENOUS | Status: DC | PRN
  Filled 2013-02-09: qty 100

## 2013-02-09 MED ORDER — DEXTROSE 50 % IV SOLN: 1.0000 | Freq: Once | INTRAVENOUS | Status: AC

## 2013-02-09 MED ORDER — HYDROMORPHONE HCL PF 1 MG/ML IJ SOLN: 1.0000 mg | INTRAMUSCULAR | Status: DC | PRN

## 2013-02-09 MED ORDER — INSULIN ASPART 100 UNIT/ML ~~LOC~~ SOLN
0.0000 [IU] | SUBCUTANEOUS | Status: DC
  Administered 2013-02-09 (×2): 5 [IU] via SUBCUTANEOUS
  Administered 2013-02-10: 3 [IU] via SUBCUTANEOUS
  Administered 2013-02-18 – 2013-02-22 (×2): 2 [IU] via SUBCUTANEOUS

## 2013-02-09 MED ORDER — DEXTROSE 5 % IV SOLN
1.0000 g | Freq: Three times a day (TID) | INTRAVENOUS | Status: DC
  Administered 2013-02-09 – 2013-02-11 (×7): 1 g via INTRAVENOUS
  Filled 2013-02-09 (×9): qty 1

## 2013-02-09 MED ORDER — OXYCODONE HCL 5 MG/5ML PO SOLN: 5.0000 mg | Freq: Once | ORAL | Status: DC | PRN

## 2013-02-09 MED ORDER — POTASSIUM CHLORIDE 10 MEQ/100ML IV SOLN
10.0000 meq | INTRAVENOUS | Status: AC
  Administered 2013-02-09 (×2): 10 meq via INTRAVENOUS
  Filled 2013-02-09: qty 200

## 2013-02-09 MED ORDER — ENOXAPARIN SODIUM 30 MG/0.3ML ~~LOC~~ SOLN
20.0000 mg | SUBCUTANEOUS | Status: DC
  Administered 2013-02-09 – 2013-02-28 (×20): 20 mg via SUBCUTANEOUS
  Filled 2013-02-09 (×7): qty 0.2
  Filled 2013-02-09: qty 0.3
  Filled 2013-02-09 (×6): qty 0.2
  Filled 2013-02-09: qty 0.3
  Filled 2013-02-09 (×3): qty 0.2
  Filled 2013-02-09: qty 0.3
  Filled 2013-02-09 (×2): qty 0.2
  Filled 2013-02-09: qty 0.3
  Filled 2013-02-09 (×6): qty 0.2
  Filled 2013-02-09 (×2): qty 0.3
  Filled 2013-02-09: qty 0.2

## 2013-02-09 MED ORDER — HYDROMORPHONE HCL PF 1 MG/ML IJ SOLN: 0.2500 mg | INTRAMUSCULAR | Status: DC | PRN

## 2013-02-09 MED ORDER — BIOTENE DRY MOUTH MT LIQD
15.0000 mL | Freq: Four times a day (QID) | OROMUCOSAL | Status: DC
  Administered 2013-02-09 – 2013-02-18 (×33): 15 mL via OROMUCOSAL

## 2013-02-09 MED ORDER — NOREPINEPHRINE BITARTRATE 1 MG/ML IJ SOLN
2.0000 ug/min | INTRAVENOUS | Status: DC
  Administered 2013-02-09: 10 ug/min via INTRAVENOUS
  Administered 2013-02-11: 1 ug/min via INTRAVENOUS
  Filled 2013-02-09 (×5): qty 4

## 2013-02-09 MED ORDER — PANTOPRAZOLE SODIUM 40 MG IV SOLR
40.0000 mg | Freq: Every day | INTRAVENOUS | Status: DC
  Administered 2013-02-09 – 2013-02-16 (×8): 40 mg via INTRAVENOUS
  Filled 2013-02-09 (×10): qty 40

## 2013-02-09 MED ORDER — DEXTROSE 50 % IV SOLN
25.0000 mL | Freq: Once | INTRAVENOUS | Status: AC | PRN
  Administered 2013-02-09: 25 mL via INTRAVENOUS

## 2013-02-09 MED ORDER — DEXTROSE-NACL 5-0.9 % IV SOLN
INTRAVENOUS | Status: DC
  Administered 2013-02-09 – 2013-02-12 (×3): via INTRAVENOUS
  Administered 2013-02-13: 50 mL/h via INTRAVENOUS
  Administered 2013-02-13: 22:00:00 via INTRAVENOUS

## 2013-02-09 MED ORDER — PHENYLEPHRINE HCL 10 MG/ML IJ SOLN
INTRAMUSCULAR | Status: DC | PRN
  Administered 2013-02-09: 120 ug via INTRAVENOUS
  Administered 2013-02-09: 20 ug via INTRAVENOUS
  Administered 2013-02-09 (×2): 120 ug via INTRAVENOUS
  Administered 2013-02-09: 80 ug via INTRAVENOUS
  Administered 2013-02-09: 10 ug via INTRAVENOUS
  Administered 2013-02-09: 120 ug via INTRAVENOUS
  Administered 2013-02-09: 160 ug via INTRAVENOUS
  Administered 2013-02-09: 80 ug via INTRAVENOUS

## 2013-02-09 MED ORDER — LACTATED RINGERS IV SOLN
INTRAVENOUS | Status: DC | PRN
  Administered 2013-02-09 (×2): via INTRAVENOUS

## 2013-02-09 MED ORDER — 0.9 % SODIUM CHLORIDE (POUR BTL) OPTIME
TOPICAL | Status: DC | PRN
  Administered 2013-02-09 (×2): 1000 mL

## 2013-02-09 MED ORDER — SODIUM CHLORIDE 0.9 % IV SOLN
750.0000 mL | INTRAVENOUS | Status: DC | PRN
  Administered 2013-02-09 – 2013-02-11 (×2): 750 mL via INTRAVENOUS

## 2013-02-09 MED ORDER — LIDOCAINE HCL (CARDIAC) 20 MG/ML IV SOLN
INTRAVENOUS | Status: DC | PRN
  Administered 2013-02-09: 100 mg via INTRAVENOUS

## 2013-02-09 MED ORDER — PROPOFOL 10 MG/ML IV BOLUS
INTRAVENOUS | Status: DC | PRN
  Administered 2013-02-09: 80 mg via INTRAVENOUS

## 2013-02-09 MED ORDER — OXYCODONE HCL 5 MG PO TABS: 5.0000 mg | ORAL_TABLET | Freq: Once | ORAL | Status: DC | PRN

## 2013-02-09 MED ORDER — CHLORHEXIDINE GLUCONATE 0.12 % MT SOLN
15.0000 mL | Freq: Two times a day (BID) | OROMUCOSAL | Status: DC
  Administered 2013-02-09 – 2013-02-18 (×17): 15 mL via OROMUCOSAL
  Filled 2013-02-09 (×19): qty 15

## 2013-02-09 MED ORDER — MEPERIDINE HCL 25 MG/ML IJ SOLN: 6.2500 mg | INTRAMUSCULAR | Status: DC | PRN

## 2013-02-09 MED ORDER — ROCURONIUM BROMIDE 100 MG/10ML IV SOLN
INTRAVENOUS | Status: DC | PRN
  Administered 2013-02-09 (×2): 10 mg via INTRAVENOUS
  Administered 2013-02-09: 30 mg via INTRAVENOUS

## 2013-02-09 MED ORDER — LORAZEPAM 2 MG/ML IJ SOLN: 1.0000 mg | INTRAMUSCULAR | Status: DC | PRN

## 2013-02-09 SURGICAL SUPPLY — 49 items
BLADE SURG ROTATE 9660 (MISCELLANEOUS) IMPLANT
CANISTER SUCTION 2500CC (MISCELLANEOUS) ×2 IMPLANT
CANISTER WOUND CARE 500ML ATS (WOUND CARE) ×2 IMPLANT
CATH FOLEY 2WAY SLVR  5CC 16FR (CATHETERS) ×1
CATH FOLEY 2WAY SLVR 5CC 16FR (CATHETERS) ×1 IMPLANT
CHLORAPREP W/TINT 26ML (MISCELLANEOUS) ×2 IMPLANT
CLOTH BEACON ORANGE TIMEOUT ST (SAFETY) ×2 IMPLANT
COVER SURGICAL LIGHT HANDLE (MISCELLANEOUS) ×2 IMPLANT
DRAPE LAPAROSCOPIC ABDOMINAL (DRAPES) ×2 IMPLANT
DRAPE UTILITY 15X26 W/TAPE STR (DRAPE) ×4 IMPLANT
DRAPE WARM FLUID 44X44 (DRAPE) ×2 IMPLANT
ELECT BLADE 6.5 EXT (BLADE) IMPLANT
ELECT CAUTERY BLADE 6.4 (BLADE) ×2 IMPLANT
ELECT REM PT RETURN 9FT ADLT (ELECTROSURGICAL) ×2
ELECTRODE REM PT RTRN 9FT ADLT (ELECTROSURGICAL) ×1 IMPLANT
GLOVE BIO SURGEON STRL SZ8 (GLOVE) ×2 IMPLANT
GLOVE BIOGEL PI IND STRL 7.5 (GLOVE) ×1 IMPLANT
GLOVE BIOGEL PI IND STRL 8 (GLOVE) ×3 IMPLANT
GLOVE BIOGEL PI INDICATOR 7.5 (GLOVE) ×1
GLOVE BIOGEL PI INDICATOR 8 (GLOVE) ×3
GLOVE SS N UNI LF 7.5 STRL (GLOVE) ×2 IMPLANT
GOWN STRL NON-REIN LRG LVL3 (GOWN DISPOSABLE) IMPLANT
GOWN STRL REIN XL XLG (GOWN DISPOSABLE) ×2 IMPLANT
KIT BASIN OR (CUSTOM PROCEDURE TRAY) ×2 IMPLANT
KIT ROOM TURNOVER OR (KITS) ×2 IMPLANT
LIGASURE IMPACT 36 18CM CVD LR (INSTRUMENTS) IMPLANT
NS IRRIG 1000ML POUR BTL (IV SOLUTION) ×4 IMPLANT
PACK GENERAL/GYN (CUSTOM PROCEDURE TRAY) ×2 IMPLANT
PAD ARMBOARD 7.5X6 YLW CONV (MISCELLANEOUS) ×2 IMPLANT
PAD NEG PRESSURE SENSATRAC (MISCELLANEOUS) ×2 IMPLANT
PLUG CATH AND CAP STER (CATHETERS) ×2 IMPLANT
SPECIMEN JAR LARGE (MISCELLANEOUS) IMPLANT
SPONGE ABDOMINAL VAC ABTHERA (MISCELLANEOUS) ×2 IMPLANT
SPONGE LAP 18X18 X RAY DECT (DISPOSABLE) ×2 IMPLANT
STAPLER VISISTAT 35W (STAPLE) IMPLANT
SUCTION POOLE TIP (SUCTIONS) ×2 IMPLANT
SUT ETHILON 2 0 FS 18 (SUTURE) ×2 IMPLANT
SUT NOVA NAB DX-16 0-1 5-0 T12 (SUTURE) ×2 IMPLANT
SUT VIC AB 2-0 SH 18 (SUTURE) ×6 IMPLANT
SUT VIC AB 3-0 SH 18 (SUTURE) ×2 IMPLANT
SUT VICRYL AB 2 0 TIES (SUTURE) ×2 IMPLANT
SUT VICRYL AB 3 0 TIES (SUTURE) ×2 IMPLANT
SYRINGE 10CC LL (SYRINGE) ×2 IMPLANT
TISSUE MATRIX STRATTICE 10X10 (Tissue) ×2 IMPLANT
TOWEL OR 17X24 6PK STRL BLUE (TOWEL DISPOSABLE) ×2 IMPLANT
TOWEL OR 17X26 10 PK STRL BLUE (TOWEL DISPOSABLE) ×2 IMPLANT
TRAY FOLEY CATH 14FRSI W/METER (CATHETERS) ×2 IMPLANT
WATER STERILE IRR 1000ML POUR (IV SOLUTION) IMPLANT
YANKAUER SUCT BULB TIP NO VENT (SUCTIONS) IMPLANT

## 2013-02-09 NOTE — ED Provider Notes (Signed)
Examined patient in conjunction with Dr. Rhunette Croft.  89 YOM with ho GERD, prior av block and BBB presenting with abd pain, transfer from OSH with incarcerated R obturator hernia.  On arrival, pt examined by surgery, with plan to perform exlap.  Brief examination here revealed abd tenderness, stable airway and vitals.  No complications in my care.  Transferred to surgical care without change in condition.    1. Incarcerated hernia   2. Acute encephalopathy   3. Acute respiratory failure   4. Hyperglycemia       Noel Gerold, MD 02/09/13 1427

## 2013-02-09 NOTE — Progress Notes (Signed)
Pt abdomen has not grown at this time. Pt has not complained of increasing pain, and UOP maintains 25-30cc/hr. Pt BP stable, HR is NSR with bundle branch block. Will continue to monitor.

## 2013-02-09 NOTE — Transfer of Care (Signed)
Immediate Anesthesia Transfer of Care Note  Patient: Betty [name]  Procedure(s) Performed: Procedure(s): EXPLORATORY LAPAROTOMY (N/A)  Patient Location: ICU  Anesthesia Type:General  Level of Consciousness: unresponsive and Patient remains intubated per anesthesia plan  Airway & Oxygen Therapy: Patient remains intubated per anesthesia plan and Patient placed on Ventilator (see vital sign flow sheet for setting)  Post-op Assessment: Report given to PACU RN and Post -op Vital signs reviewed and stable  Post vital signs: Reviewed and stable  Complications: No apparent anesthesia complications

## 2013-02-09 NOTE — Progress Notes (Signed)
PULMONARY  / CRITICAL CARE MEDICINE  Name: Betty [name] MRN: [id number] DOB: 1914/10/04    ADMISSION DATE:  02/08/2013 CONSULTATION DATE:  02/09/2013  REFERRING MD :  CCS PRIMARY SERVICE:  PCCM  CHIEF COMPLAINT:  Post op respiratory failure  BRIEF PATIENT DESCRIPTION: 79 yo with past medical history of AV block s/p pacemaker placement admitted with SBO secondary to incarcerated hernia.  Underwent exploratory laparotomy with hernia repair. Brought to 2300 unit with open abdomen, intubated. PCCM was consulted.  SIGNIFICANT EVENTS / STUDIES:  6/10  Exploratory lap, hernia repair  LINES / TUBES: OETT 6/11 >>> Foley 6/11 >>>  CULTURES:  ANTIBIOTICS: Cefoxitin 6/11 >>>  HISTORY OF PRESENT ILLNESS:  79 yo with past medical history of AV block s/p pacemaker placement admitted with SBO secondary to incarcerated hernia.  Underwent exploratory laparotomy with hernia repair. Brought to 2300 unit with open abdomen, intubated. PCCM was consulted.  INTERVAL HISTORY:   VITAL SIGNS: Temp:  [96.4 F (35.8 C)-98.1 F (36.7 C)] 97.2 F (36.2 C) (06/11 0600) Pulse Rate:  [76-89] 88 (06/11 0700) Resp:  [12-20] 16 (06/11 0700) BP: (83-143)/(54-82) 91/61 mmHg (06/11 0700) SpO2:  [30 %-100 %] 100 % (06/11 0700) FiO2 (%):  [30 %-50 %] 30 % (06/11 0700) Weight:  [41.2 kg (90 lb 13.3 oz)-41.277 kg (91 lb)] 41.2 kg (90 lb 13.3 oz) (06/11 0500)  HEMODYNAMICS:   VENTILATOR SETTINGS: Vent Mode:  [-] PRVC FiO2 (%):  [30 %-50 %] 30 % Set Rate:  [16 bmp] 16 bmp Vt Set:  [380 mL] 380 mL PEEP:  [5 cmH20] 5 cmH20 Plateau Pressure:  [18 cmH20] 18 cmH20  INTAKE / OUTPUT: Intake/Output     06/10 0701 - 06/11 0700 06/11 0701 - 06/12 0700   I.V. (mL/kg) 1600 (38.8)    Total Intake(mL/kg) 1600 (38.8)    Urine (mL/kg/hr) 285    Blood 50    Total Output 335     Net +1265           PHYSICAL EXAMINATION: General:  Comfortable, no distress Neuro:  Encephalopathic, nonfocal, cough / gag diminished HEENT:   PERRL, OETT Cardiovascular:  RRR, no m/r/g Lungs:  Bilateral diminished air entry, no w/r/r Abdomen:  Soft, wound vac in place, bowel sounds absent Musculoskeletal:  Moves all extremities, no edema Skin:  Intact   Recent Labs Lab 02/08/13 2313 02/09/13 0552  HGB 12.9 12.7  HCT 38.1 38.5  WBC 13.2* 5.2  PLT 198 175    Recent Labs Lab 02/08/13 2313 02/09/13 0552  NA 138 135  K 3.5 3.2*  CL 95* 98  CO2 31 26  GLUCOSE 138* 282*  BUN 38* 39*  CREATININE 0.90 1.05  CALCIUM 9.5 8.1*    Recent Labs Lab 02/09/13 0455  PHART 7.384  PCO2ART 42.3  PO2ART 221.0*  HCO3 25.2*  TCO2 26  O2SAT 100.0    Recent Labs Lab 02/09/13 0540  GLUCAP 236*    CXR:  6/11 >>> ETT OK, hyperinflated, mild interstitial markings  ASSESSMENT / PLAN:  PULMONARY A:  Acute respiratory failure (post-op). P:   Full mechanical support Daily SBT, defer extubation until clear when/if going back to OR for closure Trend ABG / CXR  CARDIOVASCULAR A: Hemodynamically stable.  No arrhythmia / ischemia.  S/p pacer. P:  Goal MAP>60 Will place PICC as her PIV access is tenuous  RENAL A:  Hypokalemia P:   Replace K Trend BMP NS/D5 to cover insensible losses  GASTROINTESTINAL A:  SBO /  incarcerated hernia s/p repair. P:   NPO Protonix for GI Px  HEMATOLOGIC A:  No active issues.  Jehovah Witness. P:  Trend CBC Lovenox for DVT Px Limit blood draws No blood products  INFECTIOUS A:  Possible peritonitis. P:   Antibiotics as above  ENDOCRINE  A:  Hyperglycemia. P:   SSI  NEUROLOGIC A:  Acute encephalopathy. P:   Goal RASS 0 to -1 Fentanyl gtt Versed PRN  I have personally obtained a history, examined the patient, evaluated laboratory and imaging results, formulated the assessment and plan and placed orders.  CRITICAL CARE:  The patient is critically ill with multiple organ systems failure and requires high complexity decision making for assessment and support, frequent  evaluation and titration of therapies, application of advanced monitoring technologies and extensive interpretation of multiple databases. Critical Care Time devoted to patient care services described in this note is 30 minutes.   Leslye Peer., MD Pulmonary and Critical Care Medicine [facility] Pager: [phone]  02/09/2013, 7:33 AM

## 2013-02-09 NOTE — Progress Notes (Signed)
Right IJ trilumen central line inserted per Zenia Resides, NP at bedside. 0.5mg  Versed IV given for the procedure as ordered. Kreg Shropshire is aware of scant UOP and low blood pressures. STAT CXR ordered and Levophed. Pharmacy made aware.

## 2013-02-09 NOTE — Progress Notes (Signed)
Day of Surgery  Subjective: On vent but arousable  Objective: Vital signs in last 24 hours: Temp:  [96.4 F (35.8 C)-98.3 F (36.8 C)] 98.3 F (36.8 C) (06/11 0732) Pulse Rate:  [76-90] 90 (06/11 0752) Resp:  [12-20] 16 (06/11 0752) BP: (83-143)/(54-82) 102/62 mmHg (06/11 0752) SpO2:  [30 %-100 %] 100 % (06/11 0752) FiO2 (%):  [30 %-50 %] 30 % (06/11 0752) Weight:  [90 lb 13.3 oz (41.2 kg)-91 lb (41.277 kg)] 90 lb 13.3 oz (41.2 kg) (06/11 0500)    Intake/Output from previous day: 06/10 0701 - 06/11 0700 In: 1775 [I.V.:1725; IV Piggyback:50] Out: 335 [Urine:285; Blood:50] Intake/Output this shift:    Open abdomen with wound VAC in place  Lab Results:   Recent Labs  02/08/13 2313 02/09/13 0552  WBC 13.2* 5.2  HGB 12.9 12.7  HCT 38.1 38.5  PLT 198 175   BMET  Recent Labs  02/08/13 2313 02/09/13 0552  NA 138 135  K 3.5 3.2*  CL 95* 98  CO2 31 26  GLUCOSE 138* 282*  BUN 38* 39*  CREATININE 0.90 1.05  CALCIUM 9.5 8.1*   PT/INR No results found for this basename: LABPROT, INR,  in the last 72 hours ABG  Recent Labs  02/09/13 0455  PHART 7.384  HCO3 25.2*    Studies/Results: Chest 2 View  02/08/2013   *RADIOLOGY REPORT*  Clinical Data: Preop small bowel obstruction.  CHEST - 2 VIEW  Comparison: 03/19/2012  Findings: Stable appearance of cardiac pacemaker.  Mild cardiac enlargement with normal pulmonary vascularity.  Emphysematous changes and scattered fibrosis in the lungs.  No focal airspace consolidation.  No blunting of costophrenic angles.  No pneumothorax.  Degenerative changes in the spine.  Residual contrast material in the urinary tract.  IMPRESSION: No evidence of active pulmonary disease.  Emphysematous changes and scattered fibrosis in the lungs.   Original Report Authenticated By: Burman Nieves, [name]   Portable Chest Xray  02/09/2013   *RADIOLOGY REPORT*  Clinical Data: Check endotracheal tube placement.  PORTABLE CHEST - 1 VIEW  Comparison:  02/08/2013  Findings: Since previous study, there has been interval placement of an endotracheal tube with tip measuring 3.5 cm from the carina. Stable appearance of the chest otherwise.  Stable appearance of cardiac pacemaker.  Emphysematous changes and scattered fibrosis in the lungs.  No focal consolidation.  No pneumothorax.  Mediastinal contours appear intact.  IMPRESSION: Endotracheal tube tip measures about 3.5 cm above the carina.   Original Report Authenticated By: Burman Nieves, [name]    Anti-infectives: Anti-infectives   Start     Dose/Rate Route Frequency Ordered Stop   02/09/13 0600  cefOXitin (MEFOXIN) 2 g in dextrose 5 % 50 mL IVPB     2 g 100 mL/hr over 30 Minutes Intravenous On call to O.R. 02/08/13 2311 02/08/13 2353   02/09/13 0600  cefOXitin (MEFOXIN) 1 g in dextrose 5 % 50 mL IVPB     1 g 100 mL/hr over 30 Minutes Intravenous 3 times per day 02/09/13 0337        Assessment/Plan: s/p Procedure(s): Exploratory Laparotomy; Repair of umbilical hernia; repair of obturator hernia; Placement of wound vac; placement of gastrostomy tube (N/A)  Will need to return to OR tomorrow for exp lap to check on bowel viability and hopefully close the abdomen  LOS: 1 day    Truth Barot A 02/09/2013

## 2013-02-09 NOTE — ED Provider Notes (Signed)
Agree with the resident evaluation and assessment. I also independently examined the patient.  Derwood Kaplan, MD 02/09/13 1645

## 2013-02-09 NOTE — Progress Notes (Addendum)
Upon assessment pt abdomen was found slightly distended and a slight amount of blood pooled around outside of wound vac. Pt complains of slight pain when abdomen is pushed on. Abdomen was measured at 15inches. Will reassess  in 30 minutes. Pt received 1 liter bolus of NS r/t labial bp, increased HR and minimal UOP. MD was notified and EKG was done as well as a troponin. Troponin came back negative. HR and BP came up after bolus. Pt alert and oriented, does not complain of pain unless abdomen is pushed on.  Will continue to monitor.

## 2013-02-09 NOTE — Progress Notes (Addendum)
Hypotensive while on propofol.   D/C propofol; initiate continuous fentanyl with intermittent Fentanyl for sedation; post surgery CBC and BMP pending;  No evidence of bleeding.   500 cc bolus ordered

## 2013-02-09 NOTE — Progress Notes (Signed)
Hypokalemia   K replaced  

## 2013-02-09 NOTE — Brief Op Note (Addendum)
02/08/2013 - 02/09/2013  6:48 AM  PATIENT:  Betty [name]  79 y.o. female  PRE-OPERATIVE DIAGNOSIS:  Small Bowel Obstruction, incarcerated hernia obturator right   Left femoral hernia  Umbilical hernia  POST-OPERATIVE DIAGNOSIS:  Same   PROCEDURE:  Procedure(s): Exploratory Laparotomy; Repair of umbilical hernia; repair of obturator hernia; Placement of wound vac; placement of gastrostomy tube (N/A)  SURGEON:  Surgeon(s) and Role:    * Ryder Chesmore A. Jenni Thew, MD - Primary  :   ASSISTANTS: none   ANESTHESIA:   general  EBL:  Total I/O In: 1100 [I.V.:1100] Out: 335 [Urine:285; Blood:50]  BLOOD ADMINISTERED:none  DRAINS: Gastrostomy Tube   LOCAL MEDICATIONS USED:  NONE  SPECIMEN:  No Specimen  DISPOSITION OF SPECIMEN:  N/A  COUNTS:  YES  TOURNIQUET:  * No tourniquets in log *  DICTATION: .Other Dictation: Dictation Number [id number]  PLAN OF CARE: Admit to inpatient   PATIENT DISPOSITION:  PACU - hemodynamically stable.   Delay start of Pharmacological VTE agent (>24hrs) due to surgical blood loss or risk of bleeding: no

## 2013-02-09 NOTE — Progress Notes (Signed)
INITIAL NUTRITION ASSESSMENT  DOCUMENTATION CODES Per approved criteria  -Severe malnutrition in the context of chronic illness -Underweight   INTERVENTION:  Recommend nutrition support initiation within 24-48 hours RD to follow for nutrition care plan  NUTRITION DIAGNOSIS: Inadequate oral intake related to inability to eat as evidenced by NPO status  Goal: Initiate nutrition support within next 24-48 hours if prolonged intubation expected  Monitor:  Nutrition support initiation, respiratory status, weight, labs, I/O's  Reason for Assessment: VDRF  79 y.o. female  Admitting Dx: small bowel obstruction  ASSESSMENT: Patientt transferred from [facility] due to 1 day hx of abdominal pain; CT showed SBO with incarcerated right obturator hernia, left femoral hernia and ventral hernia.   Patient s/p procedures 6/11: EXPLORATORY LAPAROTOMY REPAIR OF UMBILICAL HERNIA REPAIR OF OBTURATOR HERNIA PLACEMENT OF WOUND VAC PLACEMENT OF GASTROSTOMY TUBE  Patient is currently intubated on ventilator support MV: 6.9 Temp: 36.8  Noted NGT placement unsuccessful in OR.  G-tube to low intermittent suction.  Will need to return to OR tomorrow for exp lap to check on bowel viability and hopefully close the abdomen.  Patient meets criteria for severe malnutrition in the context of chronic illness given severe muscle loss (temples, clavicles, shoulders) and severe subcutaneous fat loss (biceps/triceps, below the eyes).  Height: Ht Readings from Last 1 Encounters:  02/08/13 5' (1.524 m)    Weight: Wt Readings from Last 1 Encounters:  02/09/13 90 lb 13.3 oz (41.2 kg)    Ideal Body Weight: 100 lb  % Ideal Body Weight: 90%  Wt Readings from Last 10 Encounters:  02/09/13 90 lb 13.3 oz (41.2 kg)  02/09/13 90 lb 13.3 oz (41.2 kg)  06/29/12 95 lb (43.092 kg)  03/31/12 75 lb (34.02 kg)  03/19/12 92 lb 2.4 oz (41.8 kg)  03/19/12 92 lb 2.4 oz (41.8 kg)    Usual Body Weight: 95  lb  % Usual Body Weight: 95%  BMI:  Body mass index is 17.74 kg/(m^2).  Estimated Nutritional Needs: Kcal: [phone] Protein: 70-80 gm Fluid: per MD  Skin: abdominal wound VAC  Diet Order: NPO  EDUCATION NEEDS: -No education needs identified at this time   Intake/Output Summary (Last 24 hours) at 02/09/13 1130 Last data filed at 02/09/13 1000  Gross per 24 hour  Intake   1950 ml  Output    565 ml  Net   1385 ml    Labs:   Recent Labs Lab 02/08/13 2313 02/09/13 0552  NA 138 135  K 3.5 3.2*  CL 95* 98  CO2 31 26  BUN 38* 39*  CREATININE 0.90 1.05  CALCIUM 9.5 8.1*  GLUCOSE 138* 282*    CBG (last 3)   Recent Labs  02/09/13 0540 02/09/13 0736  GLUCAP 236* 224*    Scheduled Meds: . antiseptic oral rinse  15 mL Mouth Rinse QID  . cefOXitin  1 g Intravenous Q8H  . chlorhexidine  15 mL Mouth Rinse BID  . enoxaparin (LOVENOX) injection  20 mg Subcutaneous Q24H  . insulin aspart  0-15 Units Subcutaneous Q4H  . pantoprazole (PROTONIX) IV  40 mg Intravenous Daily  . potassium chloride  10 mEq Intravenous Q1 Hr x 2    Continuous Infusions: . dextrose 5 % and 0.9% NaCl 100 mL/hr at 02/09/13 0829  . fentaNYL infusion INTRAVENOUS Stopped (02/09/13 1128)    Past Medical History  Diagnosis Date  . GERD (gastroesophageal reflux disease)   . Complete AV block 03/18/12  . 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 Pacemaker   . Refusal of blood transfusions as patient is Jehovah's Witness     Past Surgical History  Procedure Laterality Date  . No past surgeries    . Pacemaker insertion  03/18/12    initial placement    Maureen Chatters, RD, LDN Pager #: [phone] After-Hours Pager #: [phone]

## 2013-02-09 NOTE — Progress Notes (Signed)
BS = 52.  Hypoglycemia protocol initiated as ordered.  25ml (12.5 grams) D50 IV given as ordered.  Will recheck blood sugar in one hour.  IV site clean,dry and patent; no s/s of infiltration noted. Dr. Delton Coombes made aware of blood sugar.

## 2013-02-09 NOTE — Procedures (Signed)
Central Venous Catheter Insertion Procedure Note Durenda Pechacek [id number] 07-12-1915  Procedure: Insertion of Central Venous Catheter Indications: Assessment of intravascular volume, Drug and/or fluid administration and Frequent blood sampling  Procedure Details Consent: Risks of procedure as well as the alternatives and risks of each were explained to the (patient/caregiver).  Consent for procedure obtained. Time Out: Verified patient identification, verified procedure, site/side was marked, verified correct patient position, special equipment/implants available, medications/allergies/relevent history reviewed, required imaging and test results available.  Performed Real time [place] used to ID and cannulate the vessel  Maximum sterile technique was used including antiseptics, cap, gloves, gown, hand hygiene, mask and sheet. Skin prep: Chlorhexidine; local anesthetic administered A antimicrobial bonded/coated triple lumen catheter was placed in the right internal jugular vein using the Seldinger technique.  Evaluation Blood flow good Complications: No apparent complications Patient did tolerate procedure well. Chest X-ray ordered to verify placement.  CXR: pending.  BABCOCK,PETE 02/09/2013, 1:43 PM  Levy Pupa, MD, PhD 02/10/2013, 9:09 AM [place] [facility] [phone] or if no answer [phone]

## 2013-02-09 NOTE — Anesthesia Postprocedure Evaluation (Signed)
Anesthesia Post Note  Patient: Betty [name]  Procedure(s) Performed: Procedure(s) (LRB): Exploratory Laparotomy; Repair of umbilical hernia; repair of obturator hernia; Placement of wound vac; placement of gastrostomy tube (N/A)  Anesthesia type: General  Patient location: ICU  Post pain: Pain level controlled  Post assessment: Post-op Vital signs reviewed  Last Vitals:  Filed Vitals:   02/09/13 0530  BP: 98/65  Pulse: 89  Temp:   Resp:     Post vital signs: stable  Level of consciousness: Patient remains intubated per anesthesia plan  Complications: No apparent anesthesia complications

## 2013-02-09 NOTE — Progress Notes (Signed)
Dr. Delton Coombes made aware of decreased BP 76/53, and decreasing UOP of 20 - 25/hour.  New order received to give NS IVF bolus x 1.  No acute distress noted.

## 2013-02-09 NOTE — Anesthesia Preprocedure Evaluation (Addendum)
Anesthesia Evaluation  Patient identified by MRN, date of birth, ID band Patient awake    Reviewed: Allergy & Precautions, H&P , NPO status , Patient's Chart, lab work & pertinent test results  Airway Mallampati: II TM Distance: >3 FB Neck ROM: Full    Dental  (+) Edentulous Lower and Edentulous Upper   Pulmonary          Cardiovascular + dysrhythmias + pacemaker     Neuro/Psych    GI/Hepatic GERD-  Medicated and Controlled,  Endo/Other    Renal/GU      Musculoskeletal   Abdominal   Peds  Hematology   Anesthesia Other Findings   Reproductive/Obstetrics                           Anesthesia Physical Anesthesia Plan  ASA: III  Anesthesia Plan: General   Post-op Pain Management:    Induction: Intravenous, Rapid sequence and Cricoid pressure planned  Airway Management Planned: Oral ETT  Additional Equipment:   Intra-op Plan:   Post-operative Plan: Extubation in OR  Informed Consent: I have reviewed the patients History and Physical, chart, labs and discussed the procedure including the risks, benefits and alternatives for the proposed anesthesia with the patient or authorized representative who has indicated his/her understanding and acceptance.   Dental advisory given  Plan Discussed with: CRNA and Surgeon  Anesthesia Plan Comments: (Pt is a Jehovah's witness and refuses blood products.)       Anesthesia Quick Evaluation

## 2013-02-09 NOTE — Consult Note (Signed)
PULMONARY  / CRITICAL CARE MEDICINE  Name: Betty [name] MRN: [id number] DOB: May 30, 1915    ADMISSION DATE:  02/08/2013 CONSULTATION DATE:  02/09/2013  REFERRING MD :  CCS PRIMARY SERVICE:  PCCM  CHIEF COMPLAINT:  Post op respiratory failure  BRIEF PATIENT DESCRIPTION: 79 yo with past medical history of AV block s/p pacemaker placement admitted with SBO secondary to incarcerated hernia.  Underwent exploratory laparotomy with hernia repair. Brought to 2300 unit with open abdomen, intubated. PCCM was consulted.  SIGNIFICANT EVENTS / STUDIES:  6/10  Exploratory lap, hernia repair  LINES / TUBES: OETT 6/11 >>> Foley 6/11 >>>  CULTURES:  ANTIBIOTICS: Cefoxitin 6/11 >>>  The patient is encephalopathic and unable to provide history, which was obtained for available medical records.  HISTORY OF PRESENT ILLNESS:  79 yo with past medical history of AV block s/p pacemaker placement admitted with SBO secondary to incarcerated hernia.  Underwent exploratory laparotomy with hernia repair. Brought to 2300 unit with open abdomen, intubated. PCCM was consulted.  PAST MEDICAL HISTORY :  Past Medical History  Diagnosis Date  . GERD (gastroesophageal reflux disease)   . Complete AV block 03/18/12  . BBB (bundle branch block) 03/18/12    left  . Pacemaker   . Refusal of blood transfusions as patient is Jehovah's Witness    Past Surgical History  Procedure Laterality Date  . No past surgeries    . Pacemaker insertion  03/18/12    initial placement   Prior to Admission medications   Medication Sig Start Date End Date Taking? Authorizing Provider  vitamin C (ASCORBIC ACID) 500 MG tablet Take 500 mg by mouth daily.   Yes Historical Provider, MD   No Known Allergies  FAMILY HISTORY:  History reviewed. No pertinent family history.  SOCIAL HISTORY:  reports that she has never smoked. She has never used smokeless tobacco. She reports that she does not drink alcohol or use illicit drugs.  REVIEW  OF SYSTEMS:  Unable to provide.  INTERVAL HISTORY:  VITAL SIGNS: Temp:  [96.4 F (35.8 C)-98.1 F (36.7 C)] 96.4 F (35.8 C) (06/11 0400) Pulse Rate:  [83-88] 88 (06/11 0400) Resp:  [17-20] 17 (06/11 0400) BP: (83-143)/(54-82) 83/54 mmHg (06/11 0400) SpO2:  [98 %] 98 % (06/10 2344) FiO2 (%):  [30 %-50 %] 30 % (06/11 0500) Weight:  [41.277 kg (91 lb)] 41.277 kg (91 lb) (06/10 2344)  HEMODYNAMICS:   VENTILATOR SETTINGS: Vent Mode:  [-] PRVC FiO2 (%):  [30 %-50 %] 30 % Set Rate:  [16 bmp] 16 bmp Vt Set:  [380 mL] 380 mL PEEP:  [5 cmH20] 5 cmH20 Plateau Pressure:  [18 cmH20] 18 cmH20  INTAKE / OUTPUT: Intake/Output     06/10 0701 - 06/11 0700   I.V. (mL/kg) 1100 (26.6)   Total Intake(mL/kg) 1100 (26.6)   Urine (mL/kg/hr) 200   Blood 50   Total Output 250   Net +850        PHYSICAL EXAMINATION: General:  Comfortable, no distress Neuro:  Encephalopathic, nonfocal, cough / gag diminished HEENT:  PERRL, OETT Cardiovascular:  RRR, no m/r/g Lungs:  Bilateral diminished air entry, no w/r/r Abdomen:  Soft, wound vac in place, bowel sounds absent Musculoskeletal:  Moves all extremities, no edema Skin:  Intact  LABS:  Recent Labs Lab 02/08/13 2313 02/09/13 0455  HGB 12.9  --   WBC 13.2*  --   PLT 198  --   NA 138  --   K 3.5  --  CL 95*  --   CO2 31  --   GLUCOSE 138*  --   BUN 38*  --   CREATININE 0.90  --   CALCIUM 9.5  --   AST 25  --   ALT 9  --   ALKPHOS 57  --   BILITOT 0.6  --   PROT 6.8  --   ALBUMIN 2.9*  --   PHART  --  7.384  PCO2ART  --  42.3  PO2ART  --  221.0*   No results found for this basename: GLUCAP,  in the last 168 hours  CXR:  6/11 >>>  ASSESSMENT / PLAN:  PULMONARY A:  Acute respiratory failure (post-op). P:   Gaol SpO2>92, pH>7.30 Full mechanical support Daily SBT, defer intubation as plan to go back to OR Trend ABG / CXR  CARDIOVASCULAR A: Hemodynamically stable.  No arrhythmia / ischemia.  S/p pacer. P:  Goal  MAP>60 May need vascular access  RENAL A:  No active issues. P:   Trend BMP NS/D5@125   GASTROINTESTINAL A:  SBO / incarcerated hernia s/p repair. P:   NPO Protonix for GI Px  HEMATOLOGIC A:  No active issues.  Jehovah Witness. P:  Trend CBC Lovenox for DVT Px Limit blood draws No blood products  INFECTIOUS A:  Possible peritonitis. P:   Antibiotics as above  ENDOCRINE  A:  Hyperglycemia. P:   SSI  NEUROLOGIC A:  Acute encephalopathy. P:   Goal RASS 0 to -1 Fentanyl gtt Versed PRN  I have personally obtained a history, examined the patient, evaluated laboratory and imaging results, formulated the assessment and plan and placed orders.  CRITICAL CARE:  The patient is critically ill with multiple organ systems failure and requires high complexity decision making for assessment and support, frequent evaluation and titration of therapies, application of advanced monitoring technologies and extensive interpretation of multiple databases. Critical Care Time devoted to patient care services described in this note is 35 minutes.   Lonia Farber, MD Pulmonary and Critical Care Medicine [facility] Pager: [phone]  02/09/2013, 5:17 AM

## 2013-02-09 NOTE — Progress Notes (Signed)
BS = 97 at this time.  Hypoglycemia protocol followed as ordered was effective.  Dr Kavin Leech NP, Theron Arista,  made aware at bedside consulting with patient's family.

## 2013-02-09 NOTE — Op Note (Signed)
NAMEKINDLE, STROHMEIER NO.:  [id number]  MEDICAL RECORD NO.:  [id number]  LOCATION:  2302                         FACILITY:  [facility]  PHYSICIAN:  Maisie Fus A. Kathrin Folden, M.D.DATE OF BIRTH:  [birth year]  DATE OF PROCEDURE:  02/09/2013 DATE OF DISCHARGE:                              OPERATIVE REPORT   PREOPERATIVE DIAGNOSES: 1. Incarcerated right obturator hernia. 2. Left femoral hernia. 3. Periumbilical hernia with incarcerated colon.  POSTOPERATIVE DIAGNOSES: 1. Incarcerated right obturator hernia. 2. Left femoral hernia. 3. Periumbilical hernia with incarcerated colon.  PROCEDURES: 1. Exploratory laparotomy with repair of right obturator hernia with     biologic mesh. 2. Repair of left femoral hernia with biologic mesh. 3. Vacuum pack closure of abdomen due to intestinal ischemia.  SURGEON:  Maisie Fus A. Amanee Iacovelli, [name]  ANESTHESIA:  General endotracheal anesthesia.  ESTIMATED BLOOD LOSS:  Approximately 100 mL.  SPECIMEN:  None.  INDICATIONS FOR PROCEDURE:  The patient is a 79 year old female transferred from [facility] due to the fact that they have no operative services at night for emergent surgery secondary to a small bowel obstruction, what appeared to be an incarcerated right obturator hernia.  We had a long discussion preoperative with the patient about the risks of surgery given her advanced age, but this, I felt, was the only option, if this progress, she would have intestinal ischemia and death from that.  She understood the above potential risks and understood the risk of bleeding, infection, possible death, more surgery, bowel resection, DVT, organ injury, cardiovascular complications, pulmonary complications.  She agreed to proceed after discussion of nonoperative options, which would be observation, which would unfortunately, I think, lead to intestinal perforation.  DESCRIPTION OF PROCEDURE:  The patient was met in the holding  area. Questions were answered.  The procedures was discussed with her daughter as well.  Informed consent was obtained.  She was then taken back to the operating room, placed supine on the operating room table.  After induction of general esthesia, the abdomen was prepped and draped in the sterile fashion and a Foley catheter was placed.  Time-out was done. She received 2 g of cefoxitin and 1 g Ancef.  Attempts to pass a nasogastric tube were unsuccessful and according to her daughter, she had esophageal stenosis that had  to be dilated from time to time.  Lower midline incision was made after sterile prep and drape.  Dissection was carried down, and there was a large periumbilical hernia that had  incarcerated  transverse colon.  Once again, in the abdominal cavity, we were able to dissect this off the undersurface of the umbilicus and free that up.  It was viable.  I then ran the small bowel and encountered the right obturator hernia, which was quite small and the bowel was stuck in that.  It took quite some time to reduce that.  The proximal intestine roughly 2/3rds of it appeared semi-ischemic and  the distal small bowel after the obturator hernia appeared viable.  The bowel stuck in  the hernia was quite ecchymotic but appeared viable.  The remainder of the small bowel was examined and showed some evidence of ischemia.  I repaired the obturator hernia using Strattice biologic mesh securing to the peritoneum with #2 Vicryl.  I then found a left femoral hernia, and I used #1 Novafil pop- offs to narrow down the opening with care taken not to impale the left femoral vein or left femoral artery.  I then used a small piece of Biologic Strattice and  inserted that into the remaining femoral hernia defect and secured  With interrupted #1 Novafil.  The vessels appeared not to be compromised orkinked.  I then reexamined the small bowel, and there were multipleareas of patchy necrosis that were  not frank necrosis, but this incorporated almost 2/3 of the intestine, and I felt that leaving the abdomen open and placing a vacuum pack dressing would be the next best step for the second look operation in 24-48 hours. I placed a 14-French gastrostomytube since we could not pass the nasogastric tube and her stomach was massively dilated.  This was done by placing a pursestring suture of 2-0 Vicryl in the midbody of the stomach.  Through a separate stab incision, a Foley catheter of 14-French size was brought through and balloon was tested.  I then made a gastrotomy with the cautery, placed the gastrostomy tube within the stomach and blew up the balloon and pulled up against the stomach wall.  Pursestring suture was cinched down.  The stomach was then secured to the undersurface of the abdominal wall.    The vacuum pack dressing was placed.  All final counts were counted and found to be correct of sponge, needle, instruments.  This was placed to 125 mm suction. Then, the vacuum pack dressing was placed and placed to suction.  I have placed G-tube to suction, suctioned out copious amounts of secretions. The patient was left intubated and taken to the intensive care unit in stable condition, but critically ill. All counts were correct.      Natilie Krabbenhoft A. Artemis Loyal, [name]     TAC/MEDQ  D:  02/09/2013  T:  02/09/2013  Job:  [id number]

## 2013-02-09 NOTE — Progress Notes (Signed)
No UOP noted in the past hour despite fluid bolus as ordered. (Bladder non-distended. Foley cath flushes well). SBP 70s to 80s with current BP 79/46 (54) DR. Delton Coombes MADE AWARE.  No new orders received at this time.

## 2013-02-10 ENCOUNTER — Encounter (HOSPITAL_COMMUNITY): Admission: EM | Disposition: A | Discharge status: Home / Self Care | Payer: Self-pay | Source: Home / Self Care

## 2013-02-10 ENCOUNTER — Encounter (HOSPITAL_COMMUNITY): Payer: Self-pay | Admitting: Anesthesiology

## 2013-02-10 ENCOUNTER — Inpatient Hospital Stay (HOSPITAL_COMMUNITY): Payer: Medicare Other | Admitting: Anesthesiology

## 2013-02-10 ENCOUNTER — Inpatient Hospital Stay (HOSPITAL_COMMUNITY): Payer: Medicare Other

## 2013-02-10 DIAGNOSIS — E46 Unspecified protein-calorie malnutrition: Secondary | ICD-10-CM | POA: Diagnosis present

## 2013-02-10 DIAGNOSIS — K56609 Unspecified intestinal obstruction, unspecified as to partial versus complete obstruction: Secondary | ICD-10-CM

## 2013-02-10 DIAGNOSIS — K559 Vascular disorder of intestine, unspecified: Secondary | ICD-10-CM | POA: Diagnosis present

## 2013-02-10 DIAGNOSIS — K55059 Acute (reversible) ischemia of intestine, part and extent unspecified: Secondary | ICD-10-CM

## 2013-02-10 HISTORY — PX: COLON RESECTION: SHX5231

## 2013-02-10 LAB — GLUCOSE, CAPILLARY
Glucose-Capillary: 71 mg/dL (ref 70–99)
Glucose-Capillary: 96 mg/dL (ref 70–99)

## 2013-02-10 LAB — BASIC METABOLIC PANEL
BUN: 30 mg/dL — ABNORMAL HIGH (ref 6–23)
CO2: 23 mEq/L (ref 19–32)
Chloride: 108 mEq/L (ref 96–112)
Glucose, Bld: 191 mg/dL — ABNORMAL HIGH (ref 70–99)
Potassium: 2.9 mEq/L — ABNORMAL LOW (ref 3.5–5.1)
Sodium: 138 mEq/L (ref 135–145)

## 2013-02-10 LAB — LACTIC ACID, PLASMA
Lactic Acid, Venous: 1.6 mmol/L (ref 0.5–2.2)
Lactic Acid, Venous: 1.1 mmol/L (ref 0.5–2.2)

## 2013-02-10 SURGERY — COLON RESECTION
Anesthesia: General | Site: Abdomen | Wound class: Dirty or Infected

## 2013-02-10 MED ORDER — PROPOFOL 10 MG/ML IV BOLUS
INTRAVENOUS | Status: DC | PRN
  Administered 2013-02-10: 20 mg via INTRAVENOUS

## 2013-02-10 MED ORDER — LACTATED RINGERS IV SOLN
INTRAVENOUS | Status: DC | PRN
  Administered 2013-02-10: 13:00:00 via INTRAVENOUS

## 2013-02-10 MED ORDER — 0.9 % SODIUM CHLORIDE (POUR BTL) OPTIME
TOPICAL | Status: DC | PRN
  Administered 2013-02-10 (×3): 1000 mL

## 2013-02-10 MED ORDER — SODIUM CHLORIDE 0.9 % IV SOLN
4000.0000 ug | INTRAVENOUS | Status: DC | PRN
  Administered 2013-02-10: 5 ug/min via INTRAVENOUS

## 2013-02-10 MED ORDER — FENTANYL CITRATE 0.05 MG/ML IJ SOLN
INTRAMUSCULAR | Status: DC | PRN
  Administered 2013-02-10 (×2): 25 ug via INTRAVENOUS
  Administered 2013-02-10: 50 ug via INTRAVENOUS

## 2013-02-10 MED ORDER — ROCURONIUM BROMIDE 100 MG/10ML IV SOLN
INTRAVENOUS | Status: DC | PRN
  Administered 2013-02-10: 50 mg via INTRAVENOUS

## 2013-02-10 MED ORDER — POTASSIUM CHLORIDE 10 MEQ/50ML IV SOLN
10.0000 meq | INTRAVENOUS | Status: AC
  Administered 2013-02-10 (×3): 10 meq via INTRAVENOUS
  Filled 2013-02-10: qty 150

## 2013-02-10 MED ORDER — SODIUM CHLORIDE 0.9 % IV BOLUS (SEPSIS)
500.0000 mL | Freq: Once | INTRAVENOUS | Status: AC
  Administered 2013-02-10: 500 mL via INTRAVENOUS

## 2013-02-10 SURGICAL SUPPLY — 54 items
BANDAGE GAUZE ELAST BULKY 4 IN (GAUZE/BANDAGES/DRESSINGS) ×2 IMPLANT
BLADE SURG ROTATE 9660 (MISCELLANEOUS) IMPLANT
CANISTER SUCTION 2500CC (MISCELLANEOUS) ×2 IMPLANT
CHLORAPREP W/TINT 26ML (MISCELLANEOUS) IMPLANT
CLOTH BEACON ORANGE TIMEOUT ST (SAFETY) ×2 IMPLANT
COVER MAYO STAND STRL (DRAPES) IMPLANT
COVER SURGICAL LIGHT HANDLE (MISCELLANEOUS) ×2 IMPLANT
DRAPE LAPAROSCOPIC ABDOMINAL (DRAPES) ×2 IMPLANT
DRAPE PROXIMA HALF (DRAPES) IMPLANT
DRAPE UTILITY 15X26 W/TAPE STR (DRAPE) ×4 IMPLANT
DRAPE WARM FLUID 44X44 (DRAPE) ×2 IMPLANT
ELECT BLADE 6.5 EXT (BLADE) IMPLANT
ELECT CAUTERY BLADE 6.4 (BLADE) ×2 IMPLANT
ELECT REM PT RETURN 9FT ADLT (ELECTROSURGICAL) ×2
ELECTRODE REM PT RTRN 9FT ADLT (ELECTROSURGICAL) ×1 IMPLANT
GLOVE BIO SURGEON STRL SZ7.5 (GLOVE) ×2 IMPLANT
GLOVE BIOGEL PI IND STRL 7.0 (GLOVE) ×1 IMPLANT
GLOVE BIOGEL PI IND STRL 7.5 (GLOVE) ×1 IMPLANT
GLOVE BIOGEL PI INDICATOR 7.0 (GLOVE) ×1
GLOVE BIOGEL PI INDICATOR 7.5 (GLOVE) ×1
GLOVE ECLIPSE 6.5 STRL STRAW (GLOVE) ×2 IMPLANT
GLOVE SURG SIGNA 7.5 PF LTX (GLOVE) ×4 IMPLANT
GLOVE SURG SS PI 6.5 STRL IVOR (GLOVE) ×2 IMPLANT
GOWN PREVENTION PLUS XLARGE (GOWN DISPOSABLE) ×4 IMPLANT
GOWN STRL NON-REIN LRG LVL3 (GOWN DISPOSABLE) ×4 IMPLANT
KIT BASIN OR (CUSTOM PROCEDURE TRAY) ×2 IMPLANT
KIT ROOM TURNOVER OR (KITS) ×2 IMPLANT
LEGGING LITHOTOMY PAIR STRL (DRAPES) IMPLANT
LIGASURE IMPACT 36 18CM CVD LR (INSTRUMENTS) IMPLANT
NS IRRIG 1000ML POUR BTL (IV SOLUTION) ×6 IMPLANT
PACK GENERAL/GYN (CUSTOM PROCEDURE TRAY) ×2 IMPLANT
PAD ARMBOARD 7.5X6 YLW CONV (MISCELLANEOUS) ×2 IMPLANT
PAD SHARPS MAGNETIC DISPOSAL (MISCELLANEOUS) IMPLANT
SPECIMEN JAR X LARGE (MISCELLANEOUS) IMPLANT
SPONGE GAUZE 4X4 12PLY (GAUZE/BANDAGES/DRESSINGS) ×2 IMPLANT
SPONGE LAP 18X18 X RAY DECT (DISPOSABLE) IMPLANT
STAPLER VISISTAT 35W (STAPLE) ×2 IMPLANT
SUCTION POOLE TIP (SUCTIONS) IMPLANT
SURGILUBE 2OZ TUBE FLIPTOP (MISCELLANEOUS) IMPLANT
SUT PDS AB 1 TP1 96 (SUTURE) ×6 IMPLANT
SUT PROLENE 2 0 CT2 30 (SUTURE) IMPLANT
SUT PROLENE 2 0 KS (SUTURE) IMPLANT
SUT SILK 2 0 SH CR/8 (SUTURE) ×2 IMPLANT
SUT SILK 2 0 TIES 10X30 (SUTURE) ×2 IMPLANT
SUT SILK 3 0 SH CR/8 (SUTURE) ×2 IMPLANT
SUT SILK 3 0 TIES 10X30 (SUTURE) ×2 IMPLANT
SUT VIC AB 3-0 SH 8-18 (SUTURE) IMPLANT
SYR BULB IRRIGATION 50ML (SYRINGE) ×2 IMPLANT
TOWEL OR 17X26 10 PK STRL BLUE (TOWEL DISPOSABLE) ×4 IMPLANT
TOWEL OR NON WOVEN STRL DISP B (DISPOSABLE) ×4 IMPLANT
TRAY FOLEY CATH 14FRSI W/METER (CATHETERS) IMPLANT
TRAY PROCTOSCOPIC FIBER OPTIC (SET/KITS/TRAYS/PACK) IMPLANT
WATER STERILE IRR 1000ML POUR (IV SOLUTION) IMPLANT
YANKAUER SUCT BULB TIP NO VENT (SUCTIONS) ×2 IMPLANT

## 2013-02-10 NOTE — Progress Notes (Signed)
Pt back from OR without complications. RT will continue to monitor.

## 2013-02-10 NOTE — Progress Notes (Signed)
Transported to OR via bed and on monitor. Escorted by CRNA, RN, and OR tech. Safety maintained. Reported off to CRNA. Family updated.

## 2013-02-10 NOTE — Progress Notes (Signed)
eLink Physician-Brief Progress Note Patient Name: Betty [name] DOB: [birth year] MRN: [id number]  Date of Service  02/10/2013   HPI/Events of Note     eICU Interventions  Hypokalemia -repleted    Intervention Category Intermediate Interventions: Electrolyte abnormality - evaluation and management  ALVA,RAKESH V. 02/10/2013, 4:26 AM

## 2013-02-10 NOTE — Progress Notes (Signed)
Reported off to oncoming shift RN. Safety maintained. No acute distress noted.  

## 2013-02-10 NOTE — Preoperative (Signed)
Beta Blockers   Reason not to administer Beta Blockers:Not Applicable 

## 2013-02-10 NOTE — Op Note (Signed)
Exp. Lap; Possible bowel resection, Abd. closure  Procedure Note  Ragen Laver 02/08/2013 - 02/10/2013   Pre-op Diagnosis: Open Abd.     Post-op Diagnosis: sam3  Procedure(s): Exp. Lap; Small bowel stricturoplasty, Abd. closure  Surgeon(s): [name], MD  Anesthesia: General  Staff:  Circulator: Gerre Pebbles Sipsis, RN Scrub Person: Denese Killings, CST Circulator Assistant: Pauletta Browns, RN  Estimated Blood Loss: Minimal                         Alois Colgan A   Date: 02/10/2013  Time: 2:01 PM

## 2013-02-10 NOTE — Progress Notes (Signed)
Dr. Orlean Bradford made aware of low UOP of 20/hour.  NS IVF bolus ordered x 1.  No acute distress noted.  Safety maintained this shift. VSS.

## 2013-02-10 NOTE — Addendum Note (Signed)
Addendum created 02/10/13 0905 by Shireen Quan, CRNA   Modules edited: Anesthesia Flowsheet, Anesthesia Medication Administration

## 2013-02-10 NOTE — Progress Notes (Signed)
MD made aware of decreased UOP at 2100 and 2300. Pt UOP remains 5-20cc/hr. Will continue to monitor and follow current orders.

## 2013-02-10 NOTE — Anesthesia Preprocedure Evaluation (Addendum)
Anesthesia Evaluation  Patient identified by MRN, date of birth, ID band Patient awake  General Assessment Comment:Intubated from the ICU. CE  Reviewed: Allergy & Precautions, H&P , NPO status , Patient's Chart, lab work & pertinent test results  Airway      Comment: Intubated from the ICU. CE Dental  (+) Edentulous Upper and Edentulous Lower   Pulmonary  breath sounds clear to auscultation        Cardiovascular + dysrhythmias + pacemaker     Neuro/Psych Anxiety    GI/Hepatic GERD-  Controlled,  Endo/Other    Renal/GU      Musculoskeletal   Abdominal   Peds  Hematology   Anesthesia Other Findings   Reproductive/Obstetrics                         Anesthesia Physical Anesthesia Plan  ASA: IV  Anesthesia Plan: General   Post-op Pain Management:    Induction: Intravenous and Inhalational  Airway Management Planned: Oral ETT  Additional Equipment:   Intra-op Plan:   Post-operative Plan: Post-operative intubation/ventilation  Informed Consent: I have reviewed the patients History and Physical, chart, labs and discussed the procedure including the risks, benefits and alternatives for the proposed anesthesia with the patient or authorized representative who has indicated his/her understanding and acceptance.   Dental advisory given  Plan Discussed with: CRNA, Anesthesiologist and Surgeon  Anesthesia Plan Comments:        Anesthesia Quick Evaluation

## 2013-02-10 NOTE — Progress Notes (Signed)
MD notified of pts increased abdominal pain, distended and taut abdomen with small amt of increased , low CVP's, and increased intra-abdominal pressure. Pt vitals are stable and UOP remains 20-30 cc/hr. MD stated nothing needed to be done at this time, and to administer a 500cc bolus of NS.

## 2013-02-10 NOTE — Transfer of Care (Signed)
Immediate Anesthesia Transfer of Care Note  Patient: Betty [name]  Procedure(s) Performed: Procedure(s): EXPLORATORY LAPAROTOMY, CLOSURE OF ABDOMEN (N/A)  Patient Location: SICU  Anesthesia Type:General  Level of Consciousness: sedated, unresponsive and Patient remains intubated per anesthesia plan  Airway & Oxygen Therapy: Patient placed on Ventilator (see vital sign flow sheet for setting)  Post-op Assessment: Report given to PACU RN and Post -op Vital signs reviewed and stable  Post vital signs: Reviewed and stable  Complications: No apparent anesthesia complications

## 2013-02-10 NOTE — Progress Notes (Signed)
PULMONARY  / CRITICAL CARE MEDICINE  Name: Betty [name] MRN: [id number] DOB: [birth year]    ADMISSION DATE:  02/08/2013 CONSULTATION DATE:  02/09/2013  REFERRING MD :  CCS PRIMARY SERVICE:  PCCM  CHIEF COMPLAINT:  Post op respiratory failure  BRIEF PATIENT DESCRIPTION: 79 yo with past medical history of AV block s/p pacemaker placement admitted with SBO secondary to incarcerated hernia.  Underwent exploratory laparotomy with hernia repair. Brought to 2300 unit with open abdomen, intubated. PCCM was consulted.  SIGNIFICANT EVENTS / STUDIES:  6/10  Exploratory lap, hernia repair  LINES / TUBES: OETT 6/11 >>> Foley 6/11 >>> R IJ CVC 6/11 >>   CULTURES:  ANTIBIOTICS: Cefoxitin 6/11 >>>  HISTORY OF PRESENT ILLNESS:  79 yo with past medical history of AV block s/p pacemaker placement admitted with SBO secondary to incarcerated hernia.  Underwent exploratory laparotomy with hernia repair. Brought to 2300 unit with open abdomen, intubated. PCCM was consulted.  INTERVAL HISTORY: Developed shock pm 6/11, started on norepi and CVC placed Awake and interacting, writing notes  VITAL SIGNS: Temp:  [97.4 F (36.3 C)-98.6 F (37 C)] 98.3 F (36.8 C) (06/12 0808) Pulse Rate:  [78-107] 82 (06/12 0900) Resp:  [0-28] 14 (06/12 0900) BP: (46-156)/(31-102) 122/61 mmHg (06/12 0900) SpO2:  [96 %-100 %] 100 % (06/12 0900) FiO2 (%):  [30 %] 30 % (06/12 0835) Weight:  [46.3 kg (102 lb 1.2 oz)] 46.3 kg (102 lb 1.2 oz) (06/12 0530)  HEMODYNAMICS: CVP:  [0 mmHg-8 mmHg] 4 mmHg VENTILATOR SETTINGS: Vent Mode:  [-] PSV;CPAP FiO2 (%):  [30 %] 30 % Set Rate:  [12 bmp-14 bmp] 12 bmp Vt Set:  [450 mL] 450 mL PEEP:  [5 cmH20] 5 cmH20 Pressure Support:  [10 cmH20] 10 cmH20 Plateau Pressure:  [19 cmH20-22 cmH20] 21 cmH20  INTAKE / OUTPUT: Intake/Output     06/11 0701 - 06/12 0700 06/12 0701 - 06/13 0700   I.V. (mL/kg) 3248.5 (70.2) 132.5 (2.9)   Other 500    IV Piggyback 1850    Total Intake(mL/kg)  5598.5 (120.9) 132.5 (2.9)   Urine (mL/kg/hr) 545 (0.5)    Drains 325 (0.3) 80 (0.8)   Blood     Total Output 870 80   Net +4728.5 +52.5         PHYSICAL EXAMINATION: General:  Comfortable, no distress Neuro:  Awake and alert, nonfocal, cough / gag intact HEENT:  PERRL, OETT Cardiovascular:  RRR, no m/r/g Lungs:  Bilateral diminished air entry, no w/r/r Abdomen:  Soft, wound vac in place, bowel sounds absent Musculoskeletal:  Moves all extremities, no edema Skin:  Intact   Recent Labs Lab 02/08/13 2313 02/09/13 0552  HGB 12.9 12.7  HCT 38.1 38.5  WBC 13.2* 5.2  PLT 198 175    Recent Labs Lab 02/08/13 2313 02/09/13 0552 02/10/13 0340  NA 138 135 138  K 3.5 3.2* 2.9*  CL 95* 98 108  CO2 31 26 23   GLUCOSE 138* 282* 191*  BUN 38* 39* 30*  CREATININE 0.90 1.05 0.86  CALCIUM 9.5 8.1* 6.9*    Recent Labs Lab 02/09/13 0455 02/09/13 1314  PHART 7.384 7.451*  PCO2ART 42.3 34.5*  PO2ART 221.0* 134.0*  HCO3 25.2* 24.1*  TCO2 26 25  O2SAT 100.0 99.0    Recent Labs Lab 02/09/13 1532 02/09/13 1950 02/09/13 2328 02/10/13 0408 02/10/13 0806  GLUCAP 95 79 116* 132* 86    CXR:  6/11 >>> ETT OK, hyperinflated, mild interstitial markings  ASSESSMENT / PLAN:  PULMONARY A:  Acute respiratory failure (post-op). P:   Full mechanical support Daily SBT, defer extubation until after back to OR for closure Trend CXR  CARDIOVASCULAR A: Septic (+/- cardiogenic) shock.   S/p pacer. P:  Goal MAP>60, norepi running, wean as able   RENAL A:  Hypokalemia P:   Replace K Trend BMP NS/D5 to cover insensible losses  GASTROINTESTINAL A:  SBO / incarcerated hernia s/p repair. P:   Back to OR 6/12 for exploration and probable wound closure NPO Protonix for GI Px  HEMATOLOGIC A:  No active issues.  Jehovah Witness. P:  Trend CBC Lovenox for DVT Px Limit blood draws No blood products  INFECTIOUS A:  Possible peritonitis. P:   Antibiotics as above,  expand   ENDOCRINE  A:  Hyperglycemia. P:   SSI  NEUROLOGIC A:  Acute encephalopathy. P:   Goal RASS 0 to -1 Fentanyl gtt Versed PRN  I have personally obtained a history, examined the patient, evaluated laboratory and imaging results, formulated the assessment and plan and placed orders.  CRITICAL CARE:  The patient is critically ill with multiple organ systems failure and requires high complexity decision making for assessment and support, frequent evaluation and titration of therapies, application of advanced monitoring technologies and extensive interpretation of multiple databases. Critical Care Time devoted to patient care services described in this note is 30 minutes.   Leslye Peer., MD Pulmonary and Critical Care Medicine [facility] Pager: [phone]  02/10/2013, 9:11 AM

## 2013-02-10 NOTE — Progress Notes (Signed)
Patient ID: Betty [name], female   DOB: 1915-05-05, 79 y.o.   MRN: [id number]  Plan to go to the OR today to explore abdomen and hopefully close her.  Risks including need for bowel resection again discussed with the family who agree. She is awake and alert on the vent.

## 2013-02-10 NOTE — Anesthesia Postprocedure Evaluation (Signed)
  Anesthesia Post-op Note  Patient: Public relations account executive  Procedure(s) Performed: Procedure(s): EXPLORATORY LAPAROTOMY, CLOSURE OF ABDOMEN (N/A)  Patient Location: PACU and ICU  Anesthesia Type:General  Level of Consciousness: sedated  Airway and Oxygen Therapy: Patient remains intubated per anesthesia plan  Post-op Pain: mild  Post-op Assessment: Post-op Vital signs reviewed  Post-op Vital Signs: Reviewed  Complications: No apparent anesthesia complications

## 2013-02-10 NOTE — Progress Notes (Signed)
Dr. Rayburn Ma at bedside for assessment, and made aware of abdominal distention/firmness/tenderness and small amount of sanginous leakage noted from upper portion of  ABD wound vac. Dr. Rayburn Ma states there is no need for intra abdominal pressure readings.  No further orders noted at this time. Dr. Rayburn Ma states that he will be taking patient back to OR today for further exploration and possible abdominal closure.  No further orders received.

## 2013-02-11 ENCOUNTER — Inpatient Hospital Stay (HOSPITAL_COMMUNITY): Payer: Medicare Other

## 2013-02-11 DIAGNOSIS — R6521 Severe sepsis with septic shock: Secondary | ICD-10-CM

## 2013-02-11 DIAGNOSIS — A419 Sepsis, unspecified organism: Secondary | ICD-10-CM | POA: Diagnosis not present

## 2013-02-11 LAB — GLUCOSE, CAPILLARY
Glucose-Capillary: 100 mg/dL — ABNORMAL HIGH (ref 70–99)
Glucose-Capillary: 109 mg/dL — ABNORMAL HIGH (ref 70–99)
Glucose-Capillary: 69 mg/dL — ABNORMAL LOW (ref 70–99)
Glucose-Capillary: 90 mg/dL (ref 70–99)

## 2013-02-11 LAB — CBC
HCT: 27 % — ABNORMAL LOW (ref 36.0–46.0)
MCV: 82.3 fL (ref 78.0–100.0)
RDW: 14.6 % (ref 11.5–15.5)
WBC: 5.4 10*3/uL (ref 4.0–10.5)

## 2013-02-11 LAB — BASIC METABOLIC PANEL
BUN: 21 mg/dL (ref 6–23)
GFR calc non Af Amer: 71 mL/min — ABNORMAL LOW (ref >90)
Glucose, Bld: 171 mg/dL — ABNORMAL HIGH (ref 70–99)
Potassium: 3.5 mEq/L (ref 3.5–5.1)

## 2013-02-11 MED ORDER — SODIUM PHOSPHATE 3 MMOLE/ML IV SOLN
20.0000 mmol | Freq: Once | INTRAVENOUS | Status: DC
  Filled 2013-02-11: qty 6.67

## 2013-02-11 MED ORDER — SODIUM CHLORIDE 0.9 % IV SOLN
20.0000 mmol | Freq: Once | INTRAVENOUS | Status: AC
  Administered 2013-02-11: 20 mmol via INTRAVENOUS
  Filled 2013-02-11: qty 20

## 2013-02-11 MED ORDER — PIPERACILLIN-TAZOBACTAM 3.375 G IVPB
3.3750 g | Freq: Three times a day (TID) | INTRAVENOUS | Status: DC
  Administered 2013-02-11 – 2013-02-15 (×12): 3.375 g via INTRAVENOUS
  Filled 2013-02-11 (×13): qty 50

## 2013-02-11 MED ORDER — SODIUM CHLORIDE 0.9 % IV BOLUS (SEPSIS)
1000.0000 mL | Freq: Once | INTRAVENOUS | Status: AC
  Administered 2013-02-11: 1000 mL via INTRAVENOUS

## 2013-02-11 NOTE — Op Note (Signed)
Betty [name], HEMINGER NO.:  [id number]  MEDICAL RECORD NO.:  [id number]  LOCATION:  2302                         FACILITY:  [facility]  PHYSICIAN:  Abigail Miyamoto, [name] DATE OF BIRTH:  November 01, 1914  DATE OF PROCEDURE:  02/10/2013 DATE OF DISCHARGE:                              OPERATIVE REPORT   PREOPERATIVE DIAGNOSIS:  Open abdomen.  POSTOPERATIVE DIAGNOSIS:  Open abdomen.  PROCEDURE: 1. Exploratory laparotomy. 2. Small-bowel strictureplasty. 3. Closure of abdomen.  SURGEON:  Abigail Miyamoto, MD  ANESTHESIA:  General.  ESTIMATED BLOOD LOSS:  Minimal.  INDICATIONS:  This is a 79 year old female, who is over 24 hours status post exploratory laparotomy for a bowel obstruction from an incarcerated obturator hernia.  Her abdomen had been left open with a wound VAC in place.  She now presents for a second look laparotomy.  FINDINGS:  The patient was found to have mild ischemia of a long segment of small bowel.  This did appear viable and has pulses.  There was an area of stricturing in the distal small bowel.  I performed a stricturoplasty in this area.  PROCEDURE IN DETAIL:  The patient was brought to the operating room and anesthetized.  She was placed supine on the operating table and general anesthesia was induced.  Her wound VAC was then removed, and her abdomen was prepped and draped in usual sterile fashion.  I then eviscerated the small bowel and ran from the ligament of Treitz to the terminal ileum. There was a stricture in the distal small bowel.  The long segment of small bowel that had been obstructed was only mildly ischemic.  There were good pulses in the mesentery circumferentially.  At this point, I was able to milk small-bowel contents through the area of stricturing __________ a strictureplasty in this area.  I opened up the small bowel across the stricture with the cautery and then closed it in a transverse fashion with interrupted 3-0  silk sutures.  I then milked more small- bowel contents through this area and there was no leak identified.  I then thoroughly irrigated the abdomen with normal saline.  Hemostasis appeared to be achieved.  At this point, I then closed the patient's midline fascia with a running #1 looped PDS suture.  I then excised the redundant skin from her hernia sac.  I then packed the wound open with wet-to-dry saline gauze.  Dry gauze and ABDs were placed over this.  The patient tolerated the procedure well.  All counts were correct at the end of the procedure.  The patient was then taken still intubated from the operating room to the intensive care unit.     Abigail Miyamoto, [name]     DB/MEDQ  D:  02/10/2013  T:  02/11/2013  Job:  [id number]

## 2013-02-11 NOTE — Progress Notes (Signed)
PULMONARY  / CRITICAL CARE MEDICINE  Name: Betty [name] MRN: [id number] DOB: [birth year]    ADMISSION DATE:  02/08/2013 CONSULTATION DATE:  02/09/2013  REFERRING MD :  CCS PRIMARY SERVICE:  PCCM  CHIEF COMPLAINT:  Post op respiratory failure  BRIEF PATIENT DESCRIPTION: 79 yo with past medical history of AV block s/p pacemaker placement admitted with SBO secondary to incarcerated hernia.  Underwent exploratory laparotomy with hernia repair. Brought to 2300 unit with open abdomen, intubated. PCCM was consulted.  SIGNIFICANT EVENTS / STUDIES:  6/10  Exploratory lap, hernia repair 6/12 back to OR for stricturoplasty, wound closure (no bowel resection)  LINES / TUBES: OETT 6/11 >>> 6/13 Foley 6/11 >>> R IJ CVC 6/11 >>   CULTURES:  ANTIBIOTICS: Cefoxitin 6/11 >>> 6/13 Zosyn 6/13 >>   HISTORY OF PRESENT ILLNESS:  79 yo with past medical history of AV block s/p pacemaker placement admitted with SBO secondary to incarcerated hernia.  Underwent exploratory laparotomy with hernia repair. Brought to 2300 unit with open abdomen, intubated. PCCM was consulted.  INTERVAL HISTORY: Remains on norepi 3 Low UOP 10-15cc/h Awake and interacting, writing notes  VITAL SIGNS: Temp:  [96.2 F (35.7 C)-98.5 F (36.9 C)] 97.6 F (36.4 C) (06/13 0721) Pulse Rate:  [28-104] 78 (06/13 0900) Resp:  [11-25] 17 (06/13 0900) BP: (84-149)/(48-83) 120/69 mmHg (06/13 0900) SpO2:  [45 %-100 %] 100 % (06/13 0900) FiO2 (%):  [30 %] 30 % (06/13 0742)  HEMODYNAMICS: CVP:  [2 mmHg-9 mmHg] 2 mmHg VENTILATOR SETTINGS: Vent Mode:  [-] CPAP;PSV FiO2 (%):  [30 %] 30 % Set Rate:  [12 bmp] 12 bmp Vt Set:  [450 mL] 450 mL PEEP:  [5 cmH20] 5 cmH20 Pressure Support:  [5 cmH20-10 cmH20] 5 cmH20 Plateau Pressure:  [20 cmH20-23 cmH20] 20 cmH20  INTAKE / OUTPUT: Intake/Output     06/12 0701 - 06/13 0700 06/13 0701 - 06/14 0700   I.V. (mL/kg) 2673.1 (57.7)    Other 500    NG/GT 80 20   IV Piggyback 150    Total  Intake(mL/kg) 3403.1 (73.5) 20 (0.4)   Urine (mL/kg/hr) 455 (0.4) 25 (0.2)   Emesis/NG output 150 (0.1)    Drains 130 (0.1)    Total Output 735 25   Net +2668.1 -5         PHYSICAL EXAMINATION: General:  Comfortable, no distress Neuro:  Awake and alert, nonfocal, cough / gag intact HEENT:  PERRL, OETT Cardiovascular:  RRR, no m/r/g Lungs:  Bilateral diminished air entry, no w/r/r Abdomen:  Soft, wound vac in place, bowel sounds absent Musculoskeletal:  Moves all extremities, no edema Skin:  Intact   Recent Labs Lab 02/08/13 2313 02/09/13 0552 02/11/13 0337  HGB 12.9 12.7 9.2*  HCT 38.1 38.5 27.0*  WBC 13.2* 5.2 5.4  PLT 198 175 125*    Recent Labs Lab 02/08/13 2313 02/09/13 0552 02/10/13 0340 02/11/13 0337  NA 138 135 138 135  K 3.5 3.2* 2.9* 3.5  CL 95* 98 108 109  CO2 31 26 23 22   GLUCOSE 138* 282* 191* 171*  BUN 38* 39* 30* 21  CREATININE 0.90 1.05 0.86 0.72  CALCIUM 9.5 8.1* 6.9* 7.1*  MG  --   --   --  1.6  PHOS  --   --   --  1.8*    Recent Labs Lab 02/09/13 0455 02/09/13 1314  PHART 7.384 7.451*  PCO2ART 42.3 34.5*  PO2ART 221.0* 134.0*  HCO3 25.2* 24.1*  TCO2 26 25  O2SAT 100.0 99.0    Recent Labs Lab 02/10/13 1622 02/10/13 1936 02/10/13 2356 02/11/13 0407 02/11/13 0719  GLUCAP 96 92 109* 100* 113*    CXR:  6/13 >>> ETT OK, hyperinflated, mild interstitial markings  ASSESSMENT / PLAN:  PULMONARY A:  Acute respiratory failure (post-op). P:   Extubate 6/13 Push pulm hygiene  CARDIOVASCULAR A: Septic (+/- cardiogenic) shock.   Hx pacer. P:  Push CVP to > 8 w volume  Wean norepi as able   RENAL A:  Hypokalemia, resolved Hypophosphatemia P:   Replace electrolytes as indicated Trend BMP  GASTROINTESTINAL A:  SBO / incarcerated hernia s/p repair. P:   Back to OR 6/12 for exploration and probable wound closure NPO Protonix for GI Px  HEMATOLOGIC A:  No active issues.  Jehovah Witness. P:  Trend CBC Lovenox for  DVT Px Limit blood draws No blood products  INFECTIOUS A:  Possible peritonitis. P:   Expand abx to zosyn on 6/13 given persistent need for norepi  ENDOCRINE  A:  Hyperglycemia. P:   SSI  NEUROLOGIC A:  Acute encephalopathy. P:   Minimize narcs  I have personally obtained a history, examined the patient, evaluated laboratory and imaging results, formulated the assessment and plan and placed orders.  CRITICAL CARE:  The patient is critically ill with multiple organ systems failure and requires high complexity decision making for assessment and support, frequent evaluation and titration of therapies, application of advanced monitoring technologies and extensive interpretation of multiple databases. Critical Care Time devoted to patient care services described in this note is 40 minutes.   Levy Pupa, MD, PhD 02/11/2013, 9:50 AM [facility] [phone] or if no answer [phone]

## 2013-02-11 NOTE — Progress Notes (Signed)
200 fentanyl wasted IV with Joni Reining, Charity fundraiser.

## 2013-02-11 NOTE — Procedures (Signed)
Extubation Procedure Note  Patient Details:   Name: Betty [name] DOB: 1915/05/12 MRN: [id number]   Airway Documentation:  Airway 7.5 mm (Active)  Secured at (cm) 22 cm 02/11/2013  7:42 AM  Measured From Lips 02/11/2013  7:42 AM  Secured Location Center 02/11/2013  7:42 AM  Secured By [employer] 02/11/2013  7:42 AM  Tube Holder Repositioned Yes 02/11/2013  7:42 AM  Cuff Pressure (cm H2O) 22 cm H2O 02/11/2013  7:42 AM  Site Condition Dry 02/10/2013  2:30 PM    Evaluation  O2 sats: 100 Complications: No apparent complications Patient did tolerate procedure well. Bilateral Breath Sounds: Diminished;Clear Suctioning: Oral;Airway Yes. Pt is awake and can follow commands. Pt has a positive cuff. Pt has good cough/gag. Extubated pt to 4L [place] with no complications. No stridor. BBS clr dim.   Kandis Nab 02/11/2013, 10:18 AM

## 2013-02-11 NOTE — Progress Notes (Signed)
1 Day Post-Op  Subjective: Awake and alert on the vent Comfortable UOP a little low  Objective: Vital signs in last 24 hours: Temp:  [96.2 F (35.7 C)-98.5 F (36.9 C)] 97.8 F (36.6 C) (06/13 0400) Pulse Rate:  [28-104] 80 (06/13 0630) Resp:  [11-25] 18 (06/13 0630) BP: (84-149)/(48-88) 109/60 mmHg (06/13 0630) SpO2:  [45 %-100 %] 100 % (06/13 0630) FiO2 (%):  [30 %] 30 % (06/13 0405)    Intake/Output from previous day: 06/12 0701 - 06/13 0700 In: 3266.8 [I.V.:2556.8; NG/GT:60; IV Piggyback:150] Out: 735 [Urine:455; Emesis/NG output:150; Drains:130] Intake/Output this shift: Total I/O In: 1460.5 [I.V.:1280.5; NG/GT:80; IV Piggyback:100] Out: 330 [Urine:180; Emesis/NG output:150]  Abdomen soft, dressing with drainage  Lab Results:   Recent Labs  02/09/13 0552 02/11/13 0337  WBC 5.2 5.4  HGB 12.7 9.2*  HCT 38.5 27.0*  PLT 175 125*   BMET  Recent Labs  02/10/13 0340 02/11/13 0337  NA 138 135  K 2.9* 3.5  CL 108 109  CO2 23 22  GLUCOSE 191* 171*  BUN 30* 21  CREATININE 0.86 0.72  CALCIUM 6.9* 7.1*   PT/INR No results found for this basename: LABPROT, INR,  in the last 72 hours ABG  Recent Labs  02/09/13 0455 02/09/13 1314  PHART 7.384 7.451*  HCO3 25.2* 24.1*    Studies/Results: Portable Chest Xray In Am  02/10/2013   *RADIOLOGY REPORT*  Clinical Data: Evaluate endotracheal tube placement.  PORTABLE CHEST - 1 VIEW  Comparison: 02/09/2013  Findings: Endotracheal tube is 2.6 cm above the carina.  Jugular central line is in the lower SVC region.  There is a dual lead cardiac pacemaker.  Lungs are clear except for mild blunting at the costophrenic angles.  Negative for a pneumothorax.  Heart and mediastinum are within normal limits.  IMPRESSION: Mild blunting at the costophrenic angles could represent atelectasis and trace pleural effusions.  Support apparatuses as described.   Original Report Authenticated By: Richarda Overlie, [name]   Dg Chest Port 1  View  02/09/2013   *RADIOLOGY REPORT*  Clinical Data: Post central line placement  PORTABLE CHEST - 1 VIEW  Comparison: 02/09/2013; 02/08/2013; 03/19/2012  Findings:  Grossly unchanged cardiac silhouette and mediastinal contours given patient rotation.  Minimal atherosclerotic calcifications within the aortic arch.  Interval placement of right jugular approach central venous catheter with tip projecting over the superior cavoatrial junction.  Stable positioning of remaining support apparatus.  No pneumothorax.  The lungs remain hyperexpanded with flattening of bilateral diaphragms.  There is grossly unchanged trace left-sided pleural effusion with associated minimal left basilar heterogeneous opacities.  No new focal airspace opacities. Unchanged bones.  IMPRESSION: 1.  Interval placement of right jugular approach central venous catheter with tip projecting over the superior cavoatrial junction. Otherwise, stable positioning of support apparatus.  No pneumothorax.  2. Unchanged trace left-sided effusion and associated left basilar opacities, atelectasis versus infiltrate.   Original Report Authenticated By: Tacey Ruiz, MD    Anti-infectives: Anti-infectives   Start     Dose/Rate Route Frequency Ordered Stop   02/09/13 0600  cefOXitin (MEFOXIN) 2 g in dextrose 5 % 50 mL IVPB     2 g 100 mL/hr over 30 Minutes Intravenous On call to O.R. 02/08/13 2311 02/08/13 2353   02/09/13 0600  cefOXitin (MEFOXIN) 1 g in dextrose 5 % 50 mL IVPB     1 g 100 mL/hr over 30 Minutes Intravenous 3 times per day 02/09/13 4098  Assessment/Plan: s/p Procedure(s): EXPLORATORY LAPAROTOMY, CLOSURE OF ABDOMEN (N/A)  Start wet to dry dressings today Vent per CCM Suspect a prolonged ileus.  May need TNA  LOS: 3 days    Betty [name] A 02/11/2013

## 2013-02-11 NOTE — Progress Notes (Signed)
ANTIBIOTIC CONSULT NOTE - INITIAL  Pharmacy Consult for zosyn Indication: peritonitis  No Known Allergies  Patient Measurements: Height: 5' (152.4 cm) Weight: 102 lb 1.2 oz (46.3 kg) IBW/kg (Calculated) : 45.5   Vital Signs: Temp: 97.6 F (36.4 C) (06/13 0721) Temp src: Oral (06/13 0721) BP: 120/69 mmHg (06/13 0900) Pulse Rate: 78 (06/13 0900) Intake/Output from previous day: 06/12 0701 - 06/13 0700 In: 3403.1 [I.V.:2673.1; NG/GT:80; IV Piggyback:150] Out: 735 [Urine:455; Emesis/NG output:150; Drains:130] Intake/Output from this shift: Total I/O In: 20 [NG/GT:20] Out: 25 [Urine:25]  Labs:  Recent Labs  02/08/13 2313 02/09/13 0552 02/10/13 0340 02/11/13 0337  WBC 13.2* 5.2  --  5.4  HGB 12.9 12.7  --  9.2*  PLT 198 175  --  125*  CREATININE 0.90 1.05 0.86 0.72   Estimated Creatinine Clearance: 30.2 ml/min (by C-G formula based on Cr of 0.72). No results found for this basename: VANCOTROUGH, Leodis Binet, VANCORANDOM, GENTTROUGH, GENTPEAK, GENTRANDOM, TOBRATROUGH, TOBRAPEAK, TOBRARND, AMIKACINPEAK, AMIKACINTROU, AMIKACIN,  in the last 72 hours   Microbiology: Recent Results (from the past 720 hour(s))  MRSA PCR SCREENING     Status: None   Collection Time    02/09/13  3:40 AM      Result Value Range Status   MRSA by PCR NEGATIVE  NEGATIVE Final   Comment:            The GeneXpert MRSA Assay (FDA     approved for NASAL specimens     only), is one component of a     comprehensive MRSA colonization     surveillance program. It is not     intended to diagnose MRSA     infection nor to guide or     monitor treatment for     MRSA infections.    Medical History: Past Medical History  Diagnosis Date  . GERD (gastroesophageal reflux disease)   . Complete AV block 03/18/12  . BBB (bundle branch block) 03/18/12    left  . Pacemaker   . Refusal of blood transfusions as patient is Jehovah's Witness     Medications:  Prescriptions prior to admission  Medication  Sig Dispense Refill  . vitamin C (ASCORBIC ACID) 500 MG tablet Take 500 mg by mouth daily.       Assessment: Betty [name] is a 79 yo F s/p exp lap w/ hernia repair on 6/10. Back to OR 6/12 for wound closure. Cefoxitin to be broadened to zosyn per pharmacy given persistent need for norepi. Her wt is 46.3 kg.  Her creat is 0.72.  WBC  5.4, afebrile.  No cultures ordered.  CXR OK.  Zosyn to treat peritonitis.  Goal of Therapy:  Eradicate infection  Plan:  1. Zosyn 3.376 grams IV q8h, infuse each dose over 4 hours 2. F/u renal fuction, clinical couse Herby Abraham, Pharm.D. [phone] 02/11/2013 10:09 AM

## 2013-02-11 NOTE — Progress Notes (Signed)
[facility] ADULT ICU REPLACEMENT PROTOCOL FOR AM LAB REPLACEMENT ONLY  The patient does not apply for the [facility] Adult ICU Electrolyte Replacment Protocol based on the criteria listed below:   2. Is urine output >/= 0.5 ml/kg/hr for the last 6 hours? no Patient's UOP is 0 ml/kg/hr 6. If a panic level lab has been reported, has the CCM MD in charge been notified? yes.   Physician:  Maximiano Coss, Carney Bern P 02/11/2013 4:38 AM

## 2013-02-12 LAB — GLUCOSE, CAPILLARY
Glucose-Capillary: 74 mg/dL (ref 70–99)
Glucose-Capillary: 63 mg/dL — ABNORMAL LOW (ref 70–99)
Glucose-Capillary: 113 mg/dL — ABNORMAL HIGH (ref 70–99)
Glucose-Capillary: 99 mg/dL (ref 70–99)

## 2013-02-12 LAB — BASIC METABOLIC PANEL
CO2: 21 mEq/L (ref 19–32)
Calcium: 6.9 mg/dL — ABNORMAL LOW (ref 8.4–10.5)
Creatinine, Ser: 0.82 mg/dL (ref 0.50–1.10)
Glucose, Bld: 75 mg/dL (ref 70–99)

## 2013-02-12 LAB — PHOSPHORUS: Phosphorus: 3 mg/dL (ref 2.3–4.6)

## 2013-02-12 LAB — CBC
HCT: 25.8 % — ABNORMAL LOW (ref 36.0–46.0)
Hemoglobin: 8.7 g/dL — ABNORMAL LOW (ref 12.0–15.0)
MCH: 27.7 pg (ref 26.0–34.0)
MCHC: 33.7 g/dL (ref 30.0–36.0)

## 2013-02-12 MED ORDER — POTASSIUM CHLORIDE 10 MEQ/50ML IV SOLN
10.0000 meq | INTRAVENOUS | Status: AC
  Administered 2013-02-12 (×4): 10 meq via INTRAVENOUS
  Filled 2013-02-12: qty 200

## 2013-02-12 MED ORDER — DEXTROSE 50 % IV SOLN
INTRAVENOUS | Status: AC
  Administered 2013-02-12: 25 mL via INTRAVENOUS
  Filled 2013-02-12: qty 50

## 2013-02-12 MED ORDER — SODIUM CHLORIDE 0.9 % IJ SOLN
10.0000 mL | Freq: Two times a day (BID) | INTRAMUSCULAR | Status: DC
  Administered 2013-02-12 – 2013-02-15 (×7): 10 mL
  Administered 2013-02-16: 20 mL
  Administered 2013-02-16 – 2013-02-17 (×2): 30 mL
  Administered 2013-02-17 – 2013-02-18 (×2): 10 mL
  Administered 2013-02-18: 30 mL
  Administered 2013-02-19: 20 mL
  Administered 2013-02-19: 10 mL
  Administered 2013-02-20 (×2): 20 mL
  Administered 2013-02-23: 10 mL

## 2013-02-12 MED ORDER — ONDANSETRON HCL 4 MG/2ML IJ SOLN
INTRAMUSCULAR | Status: AC
  Administered 2013-02-12: 4 mg via INTRAVENOUS
  Filled 2013-02-12: qty 2

## 2013-02-12 MED ORDER — DEXTROSE 50 % IV SOLN: 25.0000 mL | Freq: Once | INTRAVENOUS | Status: AC | PRN

## 2013-02-12 MED ORDER — DEXTROSE 50 % IV SOLN
25.0000 mL | Freq: Once | INTRAVENOUS | Status: AC | PRN
  Administered 2013-02-12: 25 mL via INTRAVENOUS

## 2013-02-12 MED ORDER — SODIUM CHLORIDE 0.9 % IJ SOLN
10.0000 mL | INTRAMUSCULAR | Status: DC | PRN
  Administered 2013-02-21: 20 mL

## 2013-02-12 MED ORDER — ONDANSETRON HCL 4 MG/2ML IJ SOLN
4.0000 mg | Freq: Three times a day (TID) | INTRAMUSCULAR | Status: DC | PRN
  Administered 2013-02-17 – 2013-02-18 (×2): 4 mg via INTRAVENOUS
  Filled 2013-02-12 (×2): qty 2

## 2013-02-12 NOTE — Progress Notes (Signed)
2 Days Post-Op  Subjective: EXTUBATED IN GOOD SPIRITS.RIGHT FOOT PAIN   Objective: Vital signs in last 24 hours: Temp:  [97.9 F (36.6 C)-98.4 F (36.9 C)] 97.9 F (36.6 C) (06/14 0725) Pulse Rate:  [65-82] 71 (06/14 1000) Resp:  [13-23] 19 (06/14 1000) BP: (96-120)/(48-68) 116/58 mmHg (06/14 1000) SpO2:  [99 %-100 %] 100 % (06/14 1000)    Intake/Output from previous day: 06/13 0701 - 06/14 0700 In: 3749.3 [I.V.:2223.3; NG/GT:20; IV Piggyback:1506] Out: 1075 [Urine:825; Emesis/NG output:250] Intake/Output this shift: Total I/O In: 200 [I.V.:100; IV Piggyback:100] Out: 325 [Urine:25; Emesis/NG output:300]  Incision/Wound:CLOSED FASCIA CLEAN  SOFT ND EXT:  NO EDEMA    Lab Results:   Recent Labs  02/11/13 0337 02/12/13 0425  WBC 5.4 3.8*  HGB 9.2* 8.7*  HCT 27.0* 25.8*  PLT 125* 135*   BMET  Recent Labs  02/11/13 0337 02/12/13 0425  NA 135 139  K 3.5 3.1*  CL 109 110  CO2 22 21  GLUCOSE 171* 75  BUN 21 15  CREATININE 0.72 0.82  CALCIUM 7.1* 6.9*   PT/INR No results found for this basename: LABPROT, INR,  in the last 72 hours ABG  Recent Labs  02/09/13 1314  PHART 7.451*  HCO3 24.1*    Studies/Results: Dg Chest Port 1 View  02/11/2013   *RADIOLOGY REPORT*  Clinical Data: Evaluate ETT, lines  PORTABLE CHEST - 1 VIEW  Comparison: 02/10/2013  Findings: Endotracheal tube terminates 1.5 cm above the carina. Stable right IJ venous catheter.  Chronic interstitial markings.  No focal consolidation. Mild blunting of the bilateral costophrenic angles.  No pneumothorax.  The heart is normal in size.  Left subclavian pacemaker.  IMPRESSION: Endotracheal tube terminates 1.5 cm above the carina.  No interval change.   Original Report Authenticated By: Charline Bills, [name]    Anti-infectives: Anti-infectives   Start     Dose/Rate Route Frequency Ordered Stop   02/11/13 1200  piperacillin-tazobactam (ZOSYN) IVPB 3.375 g     3.375 g 12.5 mL/hr over 240 Minutes  Intravenous 3 times per day 02/11/13 1002     02/09/13 0600  cefOXitin (MEFOXIN) 2 g in dextrose 5 % 50 mL IVPB     2 g 100 mL/hr over 30 Minutes Intravenous On call to O.R. 02/08/13 2311 02/08/13 2353   02/09/13 0600  cefOXitin (MEFOXIN) 1 g in dextrose 5 % 50 mL IVPB  Status:  Discontinued     1 g 100 mL/hr over 30 Minutes Intravenous 3 times per day 02/09/13 0337 02/11/13 0953      Assessment/Plan: s/p Procedure(s): EXPLORATORY LAPAROTOMY, CLOSURE OF ABDOMEN (N/A) PC malnutrition start PO intake today. Clamp GT DVT prophylaxsis Keep in ICU today.  To floor in am. Keep foley for strict I/O   LOS: 4 days    Miho Monda A. 02/12/2013

## 2013-02-12 NOTE — Progress Notes (Signed)
PULMONARY  / CRITICAL CARE MEDICINE  Name: Betty [name] MRN: [id number] DOB: 11-17-1914    ADMISSION DATE:  02/08/2013 CONSULTATION DATE:  02/09/2013  REFERRING MD :  CCS PRIMARY SERVICE:  PCCM  CHIEF COMPLAINT:  Post op respiratory failure  BRIEF PATIENT DESCRIPTION: 79 yo with past medical history of AV block s/p pacemaker placement admitted with SBO secondary to incarcerated hernia.  Underwent exploratory laparotomy with hernia repair. Brought to 2300 unit with open abdomen, intubated. PCCM was consulted.  SIGNIFICANT EVENTS / STUDIES:  6/10  Exploratory lap, hernia repair 6/12 back to OR for stricturoplasty, wound closure (no bowel resection)  LINES / TUBES: OETT 6/11 >>> 6/13 Foley 6/11 >>> R IJ CVC 6/11 >>   CULTURES:  ANTIBIOTICS: Cefoxitin 6/11 >>> 6/13 Zosyn 6/13 >>   HISTORY OF PRESENT ILLNESS:  79 yo with past medical history of AV block s/p pacemaker placement admitted with SBO secondary to incarcerated hernia.  Underwent exploratory laparotomy with hernia repair. Brought to 2300 unit with open abdomen, intubated. PCCM was consulted.  INTERVAL HISTORY: Extubated, says she feels good  VITAL SIGNS: Temp:  [96.2 F (35.7 C)-98.5 F (36.9 C)] 97.6 F (36.4 C) (06/13 0721) Pulse Rate:  [28-104] 78 (06/13 0900) Resp:  [11-25] 17 (06/13 0900) BP: (84-149)/(48-83) 120/69 mmHg (06/13 0900) SpO2:  [45 %-100 %] 100 % (06/13 0900) FiO2 (%):  [30 %] 30 % (06/13 0742)  HEMODYNAMICS: CVP:  [2 mmHg-9 mmHg] 2 mmHg VENTILATOR SETTINGS: Vent Mode:  [-] CPAP;PSV FiO2 (%):  [30 %] 30 % Set Rate:  [12 bmp] 12 bmp Vt Set:  [450 mL] 450 mL PEEP:  [5 cmH20] 5 cmH20 Pressure Support:  [5 cmH20-10 cmH20] 5 cmH20 Plateau Pressure:  [20 cmH20-23 cmH20] 20 cmH20  INTAKE / OUTPUT: Intake/Output     06/12 0701 - 06/13 0700 06/13 0701 - 06/14 0700   I.V. (mL/kg) 2673.1 (57.7)    Other 500    NG/GT 80 20   IV Piggyback 150    Total Intake(mL/kg) 3403.1 (73.5) 20 (0.4)   Urine  (mL/kg/hr) 455 (0.4) 25 (0.2)   Emesis/NG output 150 (0.1)    Drains 130 (0.1)    Total Output 735 25   Net +2668.1 -5         PHYSICAL EXAMINATION: General:  Comfortable, no distress Neuro:  Awake and alert, nonfocal, cough / gag intact HEENT:  PERRL, OETT Cardiovascular:  RRR, no m/r/g Lungs:  Bilateral diminished air entry, no w/r/r Abdomen:  Soft, wound vac in place, bowel sounds absent Musculoskeletal:  Moves all extremities, no edema Skin:  Intact   Recent Labs  Recent Labs Lab 02/10/13 0340 02/11/13 0337 02/12/13 0425  NA 138 135 139  K 2.9* 3.5 3.1*  CL 108 109 110  CO2 23 22 21   BUN 30* 21 15  CREATININE 0.86 0.72 0.82  GLUCOSE 191* 171* 75    Recent Labs Lab 02/09/13 0552 02/11/13 0337 02/12/13 0425  HGB 12.7 9.2* 8.7*  HCT 38.5 27.0* 25.8*  WBC 5.2 5.4 3.8*  PLT 175 125* 135*      CXR:  6/13 >>> ETT OK, hyperinflated, mild interstitial markings  ASSESSMENT / PLAN:  PULMONARY A:  Acute respiratory failure (post-op). P:   - Wean oxygen as tolerated - IS and pulmonary toilet - OOB to chair  CARDIOVASCULAR A: Septic (+/- cardiogenic) shock.   Hx pacer. P:  - MIVF - Monitor CVP trend   RENAL A:  Hypokalemia, resolved Hypophosphatemia P:   -  Replace electrolytes as indicated - Trend BMP  GASTROINTESTINAL A:  SBO / incarcerated hernia s/p repair. P:   - NPO - Diet per surgery - Protonix for GI Px  HEMATOLOGIC A:  No active issues.  Jehovah Witness. P:  - Trend CBC - Lovenox for DVT Px - Limit blood draws - No blood products  INFECTIOUS A:  Possible peritonitis. P:   - Continue Zosyn  ENDOCRINE  A:  Hyperglycemia. P:   - Follow CBG - SSI  NEUROLOGIC A:  Acute encephalopathy. P:   - Minimize narcs  Today's Summary: Mrs. Blandino is a 79 year old female who is now S/P repair of an incarcerated hernia, requiring post-op vent support and vasopressor support. She has been extubated and weaned from vasopressor agents.  She is awake and alert and feels "good" today. Will sign off at this time.   Benjamin Hocutt S-ACNP and Kreg Shropshire NP   STAFF NOTE: I, Dr Lavinia Sharps have personally reviewed patient's available data, including medical history, events of note, physical examination and test results as part of my evaluation. I have discussed with resident/NP and other care providers such as pharmacist, RN and RRT.  In addition,  I personally evaluated patient and elicited key findings of improved post op resp failure. Looking good. D/w Dr Luisa Hart and  PCCM will sign off.  Rest per NP/medical resident whose note is outlined above and that I agree with    Dr. Kalman Shan, [name], [facility].C.P Pulmonary and Critical Care Medicine Staff Physician [facility] Pager: [phone], If no answer or between  15:00h - 7:00h: call [phone]  02/12/2013 11:17 AM

## 2013-02-12 NOTE — Progress Notes (Signed)
eLink Physician-Brief Progress Note Patient Name: Betty [name] DOB: April 28, 1915 MRN: [id number]  Date of Service  02/12/2013   HPI/Events of Note  Hypokalemia    eICU Interventions  Potassium replaced   Intervention Category Intermediate Interventions: Electrolyte abnormality - evaluation and management  Shawne Eskelson 02/12/2013, 5:29 AM

## 2013-02-12 NOTE — Progress Notes (Signed)
BS 113

## 2013-02-12 NOTE — Progress Notes (Signed)
Dr. Luisa Hart had ordered G-tube to be clamped. Patient now c/o abdominal pressure and slight nausea. G-Tube placed back to ILWS and immediately drained approximately thin brown fluid. No bowel sounds auscultated.  Zenia Resides with CCM made aware.  Dr. Corliss Skains with CCS made aware. Orders received to keep G-Tube to [facility] and discontinue clear liquid diet with only ice chips po at this time.

## 2013-02-12 NOTE — Progress Notes (Signed)
BS = 63 1/2 amp D50 IV given as ordered per protocol along with Orange Juice po.  No acute distress noted.

## 2013-02-12 NOTE — Progress Notes (Signed)
Hypoglycemic Event  CBG: 69  Treatment: 25mL D50  Symptoms: none  Follow-up CBG: Time: 0035 CBG Result: 125  Possible Reasons for Event: NPO status   Comments/MD notified:    Rosie Fate  Remember to initiate Hypoglycemia Order Set & complete

## 2013-02-13 LAB — COMPREHENSIVE METABOLIC PANEL
ALT: 32 U/L (ref 0–35)
AST: 85 U/L — ABNORMAL HIGH (ref 0–37)
Albumin: 1.3 g/dL — ABNORMAL LOW (ref 3.5–5.2)
CO2: 23 mEq/L (ref 19–32)
Calcium: 7.3 mg/dL — ABNORMAL LOW (ref 8.4–10.5)
Creatinine, Ser: 0.78 mg/dL (ref 0.50–1.10)
GFR calc non Af Amer: 69 mL/min — ABNORMAL LOW (ref >90)
Sodium: 141 mEq/L (ref 135–145)
Total Protein: 4.2 g/dL — ABNORMAL LOW (ref 6.0–8.3)

## 2013-02-13 LAB — CBC
MCH: 28.4 pg (ref 26.0–34.0)
MCV: 82.1 fL (ref 78.0–100.0)
Platelets: 147 10*3/uL — ABNORMAL LOW (ref 150–400)
RBC: 2.96 MIL/uL — ABNORMAL LOW (ref 3.87–5.11)
RDW: 14.9 % (ref 11.5–15.5)
WBC: 4.2 10*3/uL (ref 4.0–10.5)

## 2013-02-13 LAB — GLUCOSE, CAPILLARY
Glucose-Capillary: 89 mg/dL (ref 70–99)
Glucose-Capillary: 103 mg/dL — ABNORMAL HIGH (ref 70–99)
Glucose-Capillary: 95 mg/dL (ref 70–99)

## 2013-02-13 MED ORDER — MORPHINE SULFATE 2 MG/ML IJ SOLN
2.0000 mg | INTRAMUSCULAR | Status: DC | PRN
  Administered 2013-02-13 – 2013-02-16 (×6): 2 mg via INTRAVENOUS
  Administered 2013-02-18: 1 mg via INTRAVENOUS
  Filled 2013-02-13 (×7): qty 1

## 2013-02-13 NOTE — Progress Notes (Signed)
Patient c/o severe abdominal pain rating 10/10 on scale to mid/generalized abdominal area.  Very faint bowel sounds auscultated to RLQ only. G-Tube to ILWS with brown fluid with sediment noted in tubing and canister. Abdomen firm, but pliable.  Patient remains NPO at this time.  Repositioned for comfort.  Dr. Dwain Sarna with [facility]CS made aware. New orders received.  Morphine 2mg  IV given as ordered.  No further orders received at this time.

## 2013-02-13 NOTE — Progress Notes (Signed)
3 Days Post-Op  Subjective: No flatus or bm yet, pain better controlled now, feels well  Objective: Vital signs in last 24 hours: Temp:  [97.7 F (36.5 C)-98.3 F (36.8 C)] 98.1 F (36.7 C) (06/15 0724) Pulse Rate:  [60-77] 60 (06/15 0800) Resp:  [11-24] 13 (06/15 0800) BP: (93-123)/(46-72) 112/52 mmHg (06/15 0800) SpO2:  [99 %-100 %] 100 % (06/15 0800)    Intake/Output from previous day: 06/14 0701 - 06/15 0700 In: 1460 [I.V.:1150; NG/GT:60; IV Piggyback:250] Out: 1720 [Urine:620; Emesis/NG output:1100] Intake/Output this shift: Total I/O In: 80 [I.V.:50; NG/GT:30] Out: -   General appearance: no distress Resp: diminished breath sounds bibasilar Cardio: regular rate and rhythm GI: wound open and clean, no bs, approp tender  Lab Results:   Recent Labs  02/12/13 0425 02/13/13  WBC 3.8* 4.2  HGB 8.7* 8.4*  HCT 25.8* 24.3*  PLT 135* 147*   BMET  Recent Labs  02/12/13 0425 02/13/13  NA 139 141  K 3.1* 3.6  CL 110 114*  CO2 21 23  GLUCOSE 75 100*  BUN 15 11  CREATININE 0.82 0.78  CALCIUM 6.9* 7.3*    Anti-infectives: Anti-infectives   Start     Dose/Rate Route Frequency Ordered Stop   02/11/13 1200  piperacillin-tazobactam (ZOSYN) IVPB 3.375 g     3.375 g 12.5 mL/hr over 240 Minutes Intravenous 3 times per day 02/11/13 1002     02/09/13 0600  cefOXitin (MEFOXIN) 2 g in dextrose 5 % 50 mL IVPB     2 g 100 mL/hr over 30 Minutes Intravenous On call to O.R. 02/08/13 2311 02/08/13 2353   02/09/13 0600  cefOXitin (MEFOXIN) 1 g in dextrose 5 % 50 mL IVPB  Status:  Discontinued     1 g 100 mL/hr over 30 Minutes Intravenous 3 times per day 02/09/13 1478 02/11/13 0953      Assessment/Plan: S/p elap hernia repair, closure of abdomen 1. Neuro- iv pain meds 2. Cv/pulm- pulm toilet 3. Gi- cont sips for now await ileus to resolve 4. Abl anemia- will follow for now 5. Continue foley until able to get up to chair 6. dispo to stepdown today 7. lovenox  scds   [facility] 02/13/2013

## 2013-02-13 NOTE — Progress Notes (Signed)
Verbal/phone report given to "Ashely" RN on unit 2600. Patient to be transferred to unit 2600/ bed 2606; via bed, and on monitor. All patient belongings including meds, cane, and chart sent with patient to room 2606.  Family updated on transfer. No acute distress noted. VSS. Safety maintained. ABD dressing c/d/i.

## 2013-02-13 NOTE — Progress Notes (Signed)
Dr. Dwain Sarna at bedside for assessment, and made aware of faint to absent bowel sounds, and G-Tube drainage of thin dark brown fluid with sediment. New orders received to transfer patient to Step-Down Unit, continue NPO status except sips and chips, and to keep G-Tube to suction at this time.  No acute distress noted.  Pain more under control at this time AEB patient resting; easy to arouse, resp even/unlabored,  VSS.

## 2013-02-14 ENCOUNTER — Encounter (HOSPITAL_COMMUNITY): Payer: Self-pay | Admitting: Surgery

## 2013-02-14 LAB — GLUCOSE, CAPILLARY
Glucose-Capillary: 90 mg/dL (ref 70–99)
Glucose-Capillary: 91 mg/dL (ref 70–99)
Glucose-Capillary: 95 mg/dL (ref 70–99)

## 2013-02-14 LAB — CBC
HCT: 24.8 % — ABNORMAL LOW (ref 36.0–46.0)
Hemoglobin: 8.4 g/dL — ABNORMAL LOW (ref 12.0–15.0)
RDW: 15.3 % (ref 11.5–15.5)
WBC: 3.2 10*3/uL — ABNORMAL LOW (ref 4.0–10.5)

## 2013-02-14 MED ORDER — VITAL AF 1.2 CAL PO LIQD
1000.0000 mL | ORAL | Status: DC
  Administered 2013-02-14: 1000 mL
  Filled 2013-02-14 (×2): qty 1000

## 2013-02-14 NOTE — Progress Notes (Signed)
NUTRITION FOLLOW UP  Intervention:   1.  Modify diet; Per MD discretion based on tolerance.  2.  Nutrition support; pt found to meet criteria for severe malnutrition on admission and has now been NPO x7 days.  Consider early initiation of nutrition support.  Nutrition Dx:   Inadequate oral intake now r/t to post-op ileus.  Ongoing.   Goal:   Initiate nutrition support within next 24-48 hours if prolonged NPO expected  Monitor:   Nutrition support initiation, respiratory status, weight, labs, I/O's  Assessment:   Pt transferred from [facility] due to abdominal pain with CT showing SBO with incarcerated right obturator hernia, left femoral hernia, and ventral hernia.  Pt required prolonged ventilation post-op but has been extubated.  Pt now with post-op ileus preventing diet advancement.    Pt with G-tube to low intermittent suction.   Pt found to meet criteria for severe malnutrition in the context of chronic illness given severe muscle loss (temples, clavicles, shoulders) and severe subcutaneous fat loss (biceps/triceps, below the eyes).   Height: Ht Readings from Last 1 Encounters:  02/13/13 5' (1.524 m)    Weight Status:   Wt Readings from Last 1 Encounters:  02/14/13 110 lb 3.7 oz (50 kg)  Wt 91 lbs on admission.   Re-estimated needs:  Kcal: 1400-1600 Protein: 65-75g Fluid: >1.5 L/day  Skin: incisions  Diet Order:  NPO x ice chips   Intake/Output Summary (Last 24 hours) at 02/14/13 1029 Last data filed at 02/14/13 0700  Gross per 24 hour  Intake   1150 ml  Output    280 ml  Net    870 ml    Last BM: PTA, +flatus   Labs:   Recent Labs Lab 02/10/13 0340 02/11/13 0337 02/12/13 0425 02/13/13  NA 138 135 139 141  K 2.9* 3.5 3.1* 3.6  CL 108 109 110 114*  CO2 23 22 21 23   BUN 30* 21 15 11   CREATININE 0.86 0.72 0.82 0.78  CALCIUM 6.9* 7.1* 6.9* 7.3*  MG  --  1.6 1.5  --   PHOS  --  1.8* 3.0  --   GLUCOSE 191* 171* 75 100*    CBG (last 3)    Recent Labs  02/13/13 2359 02/14/13 0532 02/14/13 0750  GLUCAP 95 91 88    Scheduled Meds: . antiseptic oral rinse  15 mL Mouth Rinse QID  . chlorhexidine  15 mL Mouth Rinse BID  . enoxaparin (LOVENOX) injection  20 mg Subcutaneous Q24H  . insulin aspart  0-15 Units Subcutaneous Q4H  . pantoprazole (PROTONIX) IV  40 mg Intravenous Daily  . piperacillin-tazobactam (ZOSYN)  IV  3.375 g Intravenous Q8H  . sodium chloride  10-40 mL Intracatheter Q12H    Continuous Infusions: . dextrose 5 % and 0.9% NaCl 50 mL/hr at 02/13/13 2159    Loyce Dys, MS RD LDN Clinical Inpatient Dietitian Pager: [phone] Weekend/After hours pager: [phone]

## 2013-02-14 NOTE — Progress Notes (Signed)
ANTIBIOTIC CONSULT NOTE - Follow-up  Pharmacy Consult for zosyn Indication: peritonitis  No Known Allergies  Patient Measurements: Height: 5' (152.4 cm) Weight: 110 lb 3.7 oz (50 kg) IBW/kg (Calculated) : 45.5   Vital Signs: Temp: 97.9 F (36.6 C) (06/16 1200) Temp src: Oral (06/16 1200) BP: 116/55 mmHg (06/16 1200) Pulse Rate: 63 (06/16 1200) Intake/Output from previous day: 06/15 0701 - 06/16 0700 In: 1330 [I.V.:1150; NG/GT:30; IV Piggyback:150] Out: 510 [Urine:245; Emesis/NG output:265] Intake/Output from this shift: Total I/O In: 20 [I.V.:20] Out: 150 [Urine:150]  Labs:  Recent Labs  02/12/13 0425 02/13/13 02/14/13 0500  WBC 3.8* 4.2 3.2*  HGB 8.7* 8.4* 8.4*  PLT 135* 147* 174  CREATININE 0.82 0.78  --    Estimated Creatinine Clearance: 30.2 ml/min (by C-G formula based on Cr of 0.78). No results found for this basename: VANCOTROUGH, Leodis Binet, VANCORANDOM, GENTTROUGH, GENTPEAK, GENTRANDOM, TOBRATROUGH, TOBRAPEAK, TOBRARND, AMIKACINPEAK, AMIKACINTROU, AMIKACIN,  in the last 72 hours   Microbiology: Recent Results (from the past 720 hour(s))  MRSA PCR SCREENING     Status: None   Collection Time    02/09/13  3:40 AM      Result Value Range Status   MRSA by PCR NEGATIVE  NEGATIVE Final   Comment:            The GeneXpert MRSA Assay (FDA     approved for NASAL specimens     only), is one component of a     comprehensive MRSA colonization     surveillance program. It is not     intended to diagnose MRSA     infection nor to guide or     monitor treatment for     MRSA infections.   Assessment: Mrs. Fehr is a 79 yo F s/p exp lap w/ hernia repair on 6/10. Back to OR 6/12 for wound closure. Cefoxitin to be broadened to zosyn per pharmacy. Today is D#4 of therapy. She is afebrile and WBC is low at 3.2, renal function is stable. MRSA PCR is negative, no other cultures available.   Goal of Therapy:  Eradicate infection  Plan:  1. Continue zosyn 3.375gm IV  Q8H (4 hr inf) 2. F/u renal fxn, C&S, clinical status 3. Clarify intended LOT  Lysle Pearl, PharmD, BCPS Pager # [phone] 02/14/2013 1:16 PM

## 2013-02-14 NOTE — Progress Notes (Signed)
Vitals ok No complaints  abd soft, nd, min TTP. g tube to drainage Midline skin open- viable  Keep foley for strict i &o Pt/ot Speech for swallow evaluation Trickle tube feeds  Mary Sella. Andrey Campanile, MD, FACS General, Bariatric, & Minimally Invasive Surgery [facility], [place]

## 2013-02-14 NOTE — Progress Notes (Signed)
Orthopedic Tech Progress Note Patient Details:  Betty [name] [birth year] [id number] Abdominal binder ordered and delivered to nurse in patient's room. Ortho Devices Type of Ortho Device: Abdominal binder Ortho Device/Splint Interventions: Ordered   [place] R Thompson 02/14/2013, 11:12 AM

## 2013-02-14 NOTE — Progress Notes (Signed)
NUTRITION FOLLOW UP  Intervention:   1.  Enteral nutrition; initiate Vital 1.2 @ 10 ml/hr continuous. Do not advance.  Nutrition Dx:   Inadequate oral intake now r/t to post-op ileus.  Ongoing.   Goal:   Initiate nutrition support within next 24-48 hours if prolonged NPO expected. Met, RD to order trickle feeds.   Monitor:   Nutrition support initiation, respiratory status, weight, labs, I/O's  Assessment:   Pt transferred from [facility] due to abdominal pain with CT showing SBO with incarcerated right obturator hernia, left femoral hernia, and ventral hernia.  Pt required prolonged ventilation post-op but has been extubated.  Pt now with post-op ileus preventing diet advancement.    Pt with G-tube to low intermittent suction.   Pt found to meet criteria for severe malnutrition in the context of chronic illness given severe muscle loss (temples, clavicles, shoulders) and severe subcutaneous fat loss (biceps/triceps, below the eyes).  RD consulted for initiation of trickle feeds via G-tube.  Pt also planning for SLP evaluation.    Height: Ht Readings from Last 1 Encounters:  02/13/13 5' (1.524 m)    Weight Status:   Wt Readings from Last 1 Encounters:  02/14/13 110 lb 3.7 oz (50 kg)  Wt 91 lbs on admission.   Re-estimated needs:  Kcal: 1400-1600 Protein: 65-75g Fluid: >1.5 L/day  Skin: incisions  Diet Order:  NPO x ice chips   Intake/Output Summary (Last 24 hours) at 02/14/13 1506 Last data filed at 02/14/13 1216  Gross per 24 hour  Intake  962.5 ml  Output    430 ml  Net  532.5 ml    Last BM: PTA, +flatus   Labs:   Recent Labs Lab 02/10/13 0340 02/11/13 0337 02/12/13 0425 02/13/13  NA 138 135 139 141  K 2.9* 3.5 3.1* 3.6  CL 108 109 110 114*  CO2 23 22 21 23   BUN 30* 21 15 11   CREATININE 0.86 0.72 0.82 0.78  CALCIUM 6.9* 7.1* 6.9* 7.3*  MG  --  1.6 1.5  --   PHOS  --  1.8* 3.0  --   GLUCOSE 191* 171* 75 100*    CBG (last 3)    Recent Labs  02/14/13 0532 02/14/13 0750 02/14/13 1212  GLUCAP 91 88 77    Scheduled Meds: . antiseptic oral rinse  15 mL Mouth Rinse QID  . chlorhexidine  15 mL Mouth Rinse BID  . enoxaparin (LOVENOX) injection  20 mg Subcutaneous Q24H  . insulin aspart  0-15 Units Subcutaneous Q4H  . pantoprazole (PROTONIX) IV  40 mg Intravenous Daily  . piperacillin-tazobactam (ZOSYN)  IV  3.375 g Intravenous Q8H  . sodium chloride  10-40 mL Intracatheter Q12H    Continuous Infusions: . dextrose 5 % and 0.9% NaCl 50 mL/hr at 02/13/13 2159    Loyce Dys, MS RD LDN Clinical Inpatient Dietitian Pager: [phone] Weekend/After hours pager: [phone]

## 2013-02-14 NOTE — Progress Notes (Signed)
4 Days Post-Op  Subjective: Alert cooperative, and active at home before she got sick. She complains of being sore, but very tolerant of dressing change.  Objective: Vital signs in last 24 hours: Temp:  [97.8 F (36.6 C)-99.1 F (37.3 C)] 97.8 F (36.6 C) (06/16 0748) Pulse Rate:  [62-82] 67 (06/16 0748) Resp:  [0-20] 14 (06/16 0748) BP: (95-124)/(46-75) 123/52 mmHg (06/16 0748) SpO2:  [95 %-100 %] 100 % (06/16 0748) Weight:  [50 kg (110 lb 3.7 oz)-51.3 kg (113 lb 1.5 oz)] 50 kg (110 lb 3.7 oz) (06/16 0412) NPO Tm 99.1  I/O+ 245 urine, and 265 thru gastrostomy tube.  I don't know if that's correct  Intake/Output from previous day: 06/15 0701 - 06/16 0700 In: 1330 [I.V.:1150; NG/GT:30; IV Piggyback:150] Out: 510 [Urine:245; Emesis/NG output:265] Intake/Output this shift:    General appearance: alert, cooperative and no distress Resp: clear to auscultation bilaterally GI: soft, nothing coming out of gastrostomy, no bowel sounds, she is not distended and her open wound looks good.  Lab Results:   Recent Labs  02/13/13 02/14/13 0500  WBC 4.2 3.2*  HGB 8.4* 8.4*  HCT 24.3* 24.8*  PLT 147* 174    BMET  Recent Labs  02/12/13 0425 02/13/13  NA 139 141  K 3.1* 3.6  CL 110 114*  CO2 21 23  GLUCOSE 75 100*  BUN 15 11  CREATININE 0.82 0.78  CALCIUM 6.9* 7.3*   PT/INR No results found for this basename: LABPROT, INR,  in the last 72 hours   Recent Labs Lab 02/08/13 2313 02/09/13 0552 02/13/13  AST 25 26 85*  ALT 9 7 32  ALKPHOS 57 46 154*  BILITOT 0.6 0.5 1.6*  PROT 6.8 5.7* 4.2*  ALBUMIN 2.9* 2.2* 1.3*     Lipase  No results found for this basename: lipase     Studies/Results: No results found.  Medications: . antiseptic oral rinse  15 mL Mouth Rinse QID  . chlorhexidine  15 mL Mouth Rinse BID  . enoxaparin (LOVENOX) injection  20 mg Subcutaneous Q24H  . insulin aspart  0-15 Units Subcutaneous Q4H  . pantoprazole (PROTONIX) IV  40 mg Intravenous  Daily  . piperacillin-tazobactam (ZOSYN)  IV  3.375 g Intravenous Q8H  . sodium chloride  10-40 mL Intracatheter Q12H    Assessment/Plan 1. Incarcerated right obturator hernia.  Left femoral hernia.  Periumbilical hernia with incarcerated colon:  S/p 1.Exploratory laparotomy with repair of right obturator hernia with biologic mesh.  2. Repair of left femoral hernia with biologic mesh.  3. Vacuum pack closure of abdomen due to intestinal ischemia. 02/09/2013, [name], MD  Exp. Lap; Small bowel stricturoplasty, Abd. Closure, 02/10/2013, [name], MD  Acute respiratory failure (post-op). Acute encephalopathy. (post op) Complete AV block with PTVP 03/18/12 Jehovah witness Anemia Post op ileus  Plan:  Start to mobilize, I will have them get her OOB, i will watch her today and recheck labs in AM.  CCm has signed off.  I will leave her foley in and see if she is voiding. Try some ice chips and mobilize more.       LOS: 6 days    Betty [name] 02/14/2013

## 2013-02-15 ENCOUNTER — Inpatient Hospital Stay (HOSPITAL_COMMUNITY): Payer: Medicare Other

## 2013-02-15 DIAGNOSIS — E870 Hyperosmolality and hypernatremia: Secondary | ICD-10-CM | POA: Diagnosis not present

## 2013-02-15 DIAGNOSIS — E876 Hypokalemia: Secondary | ICD-10-CM

## 2013-02-15 DIAGNOSIS — I441 Atrioventricular block, second degree: Secondary | ICD-10-CM

## 2013-02-15 LAB — COMPREHENSIVE METABOLIC PANEL
Alkaline Phosphatase: 118 U/L — ABNORMAL HIGH (ref 39–117)
BUN: 9 mg/dL (ref 6–23)
Chloride: 116 mEq/L — ABNORMAL HIGH (ref 96–112)
GFR calc Af Amer: 68 mL/min — ABNORMAL LOW (ref >90)
GFR calc non Af Amer: 59 mL/min — ABNORMAL LOW (ref >90)
Glucose, Bld: 93 mg/dL (ref 70–99)
Potassium: 3.3 mEq/L — ABNORMAL LOW (ref 3.5–5.1)
Total Bilirubin: 0.5 mg/dL (ref 0.3–1.2)

## 2013-02-15 LAB — URINALYSIS, ROUTINE W REFLEX MICROSCOPIC
Bilirubin Urine: NEGATIVE
Glucose, UA: NEGATIVE mg/dL
Hgb urine dipstick: NEGATIVE
Specific Gravity, Urine: 1.022 (ref 1.005–1.030)
Urobilinogen, UA: 0.2 mg/dL (ref 0.0–1.0)

## 2013-02-15 LAB — GLUCOSE, CAPILLARY
Glucose-Capillary: 82 mg/dL (ref 70–99)
Glucose-Capillary: 105 mg/dL — ABNORMAL HIGH (ref 70–99)
Glucose-Capillary: 96 mg/dL (ref 70–99)

## 2013-02-15 LAB — CBC
HCT: 25.7 % — ABNORMAL LOW (ref 36.0–46.0)
Hemoglobin: 8.7 g/dL — ABNORMAL LOW (ref 12.0–15.0)
MCV: 82.9 fL (ref 78.0–100.0)
WBC: 4.5 10*3/uL (ref 4.0–10.5)

## 2013-02-15 LAB — PREALBUMIN: Prealbumin: 5 mg/dL — ABNORMAL LOW (ref 17.0–34.0)

## 2013-02-15 LAB — OSMOLALITY: Osmolality: 300 mOsm/kg (ref 275–300)

## 2013-02-15 LAB — OSMOLALITY, URINE: Osmolality, Ur: 538 mOsm/kg (ref 390–1090)

## 2013-02-15 MED ORDER — POTASSIUM CHLORIDE 20 MEQ/15ML (10%) PO LIQD
20.0000 meq | Freq: Three times a day (TID) | ORAL | Status: DC
  Administered 2013-02-15 (×3): 20 meq
  Filled 2013-02-15 (×6): qty 15

## 2013-02-15 MED ORDER — MAGNESIUM SULFATE 40 MG/ML IJ SOLN
2.0000 g | Freq: Once | INTRAMUSCULAR | Status: AC
  Administered 2013-02-15: 2 g via INTRAVENOUS
  Filled 2013-02-15: qty 50

## 2013-02-15 MED ORDER — VITAL AF 1.2 CAL PO LIQD
1000.0000 mL | ORAL | Status: DC
  Administered 2013-02-15: 1000 mL
  Filled 2013-02-15 (×4): qty 1000

## 2013-02-15 MED ORDER — POTASSIUM CL IN DEXTROSE 5% 20 MEQ/L IV SOLN
20.0000 meq | INTRAVENOUS | Status: AC
  Administered 2013-02-15 – 2013-02-16 (×2): 20 meq via INTRAVENOUS
  Filled 2013-02-15 (×2): qty 1000

## 2013-02-15 NOTE — Progress Notes (Signed)
5 Days Post-Op  Subjective: Feels fairly good, stomach is less sore.  She says she hasn't been out of bed.  No flatus but some belching.    Objective: Vital signs in last 24 hours: Temp:  [97.6 F (36.4 C)-98.5 F (36.9 C)] 97.6 F (36.4 C) (06/17 0420) Pulse Rate:  [61-77] 65 (06/17 0420) Resp:  [0-26] 24 (06/17 0420) BP: (109-129)/(45-101) 125/45 mmHg (06/17 0420) SpO2:  [80 %-100 %] 100 % (06/17 0420) Weight:  [50 kg (110 lb 3.7 oz)] 50 kg (110 lb 3.7 oz) (06/17 0420) Last BM Date: 02/05/13 375 u/o recorded yesterday;  Apparently there are different places to record output and it's not being totaled I/O=? HR 60's, BP stable 46.3kg 6/12-  50 kg today K+ 3.3, Na 146 CBC is OK  Intake/Output from previous day: 06/16 0701 - 06/17 0700 In: 240 [I.V.:70; NG/GT:120; IV Piggyback:50] Out: 525 [Urine:375; Emesis/NG output:150] Intake/Output this shift:    General appearance: alert, cooperative and no distress Resp: clear to auscultation bilaterally GI: soft, few BS, no flatus, no bm, abd wound looks good. Extremities: no edema  Lab Results:   Recent Labs  02/14/13 0500 02/15/13 0500  WBC 3.2* 4.5  HGB 8.4* 8.7*  HCT 24.8* 25.7*  PLT 174 193    BMET  Recent Labs  02/13/13 02/15/13 0500  NA 141 146*  K 3.6 3.3*  CL 114* 116*  CO2 23 24  GLUCOSE 100* 93  BUN 11 9  CREATININE 0.78 0.82  CALCIUM 7.3* 7.0*   PT/INR No results found for this basename: LABPROT, INR,  in the last 72 hours   Recent Labs Lab 02/08/13 2313 02/09/13 0552 02/13/13 02/15/13 0500  AST 25 26 85* 26  ALT 9 7 32 20  ALKPHOS 57 46 154* 118*  BILITOT 0.6 0.5 1.6* 0.5  PROT 6.8 5.7* 4.2* 4.3*  ALBUMIN 2.9* 2.2* 1.3* 1.3*     Lipase  No results found for this basename: lipase     Studies/Results: No results found.  Medications: . antiseptic oral rinse  15 mL Mouth Rinse QID  . chlorhexidine  15 mL Mouth Rinse BID  . enoxaparin (LOVENOX) injection  20 mg Subcutaneous Q24H  .  insulin aspart  0-15 Units Subcutaneous Q4H  . pantoprazole (PROTONIX) IV  40 mg Intravenous Daily  . piperacillin-tazobactam (ZOSYN)  IV  3.375 g Intravenous Q8H  . sodium chloride  10-40 mL Intracatheter Q12H    Assessment/Plan 1. Incarcerated right obturator hernia. Left femoral hernia. Periumbilical hernia with incarcerated colon: S/p 1.Exploratory laparotomy with repair of right obturator hernia with biologic mesh.  2. Repair of left femoral hernia with biologic mesh.  3. Vacuum pack closure of abdomen due to intestinal ischemia. 02/09/2013, Betty Pu. Cornett, MD  Exp. Lap; Small bowel stricturoplasty, Abd. Closure, 02/10/2013, Betty Rubenstein, MD  Acute respiratory failure (post-op).  Acute encephalopathy. (post op)  Complete AV block with PTVP 03/18/12  Jehovah witness  Anemia  Post op ileus HYpokalemia (replace K+, check Mag) ? Urine output (fluid balance is + weight is up, NA is up) Medicine conslt    Plan;  Continue what we are doing Hopefully they will get her up out of bed.  I think she is voiding she looks stable and does not appear fluid overloaded.  I will check Mag and replace K+.  I have ask Betty [name] to look at her and help with medical management..  Continue wet to dry dressings. IS. D/c Zosyn.  LOS:  7 days    Betty [name] 02/15/2013

## 2013-02-15 NOTE — Consult Note (Signed)
[facility] Consult Note                                                                                    Patient Demographics  Betty [name], is a 79 y.o. female  CSN: [id number]  MRN: [id number]  DOB - [birth year]  Admit Date - 02/08/2013  Outpatient Primary MD for the patient is Provider Not In System  Consult requested in the Hospital by Bishop Limbo, MD, On 02/15/2013    Reason for consult hypernatremia   With History of -  Past Medical History  Diagnosis Date  . GERD (gastroesophageal reflux disease)   . Complete AV block 03/18/12  . BBB (bundle branch block) 03/18/12    left  . Pacemaker   . Refusal of blood transfusions as patient is Jehovah's Witness       Past Surgical History  Procedure Laterality Date  . No past surgeries    . Pacemaker insertion  03/18/12    initial placement  . Laparotomy N/A 02/09/2013    Procedure: Exploratory Laparotomy; Repair of umbilical hernia; repair of obturator hernia; Placement of wound vac; placement of gastrostomy tube;  Surgeon: [name], MD;  Location: [facility] OR;  Service: General;  Laterality: N/A;  . Colon resection N/A 02/10/2013    Procedure: EXPLORATORY LAPAROTOMY, CLOSURE OF ABDOMEN;  Surgeon: [name], MD;  Location: [facility] OR;  Service: General;  Laterality: N/A;    in for   Chief Complaint  Patient presents with  . Abdominal Pain     HPI  Betty [name]  is a 79 y.o. female, with history of Mobitz type II heart block requiring pacemaker placement by [facility] cardiology, otherwise in extremely good health, she was admitted to the hospital for abdominal pain and was found to have incarcerated umbilicus hernia requiring expiratory laparotomy by general surgery on 02/10/2013 with colon resection, she was also briefly intubated and was followed by pulmonary critical care, she now has a PEG tube and do feeds had been started by general surgery, hospitalist service has been consulted to manage her high sodium,  patient surprisingly is absolutely symptom-free. She denies any headache chest pain cough or shortness of breath, she feels hungry and is eager to eat food by mouth. Denies any focal weakness.    Review of Systems    In addition to the HPI above,   No Fever-chills, No Headache, No changes with Vision or hearing, No problems swallowing food or Liquids, No Chest pain, Cough or Shortness of Breath, No Abdominal pain, No Nausea or Vommitting, Bowel movements are regular, No Blood in stool or Urine, No dysuria, No new skin rashes or bruises, No new joints pains-aches,  No new weakness, tingling, numbness in any extremity, No recent weight gain or loss, No polyuria, polydypsia or polyphagia, No significant Mental Stressors.  A full 10 point Review of Systems was done, except as stated above, all other Review of Systems were negative.   Social History History  Substance Use Topics  . Smoking status: Never Smoker   . Smokeless tobacco: Never Used  . Alcohol Use: No      Family History No CAD  Prior  to Admission medications   Medication Sig Start Date End Date Taking? Authorizing Provider  vitamin C (ASCORBIC ACID) 500 MG tablet Take 500 mg by mouth daily.   Yes Historical Provider, MD    Anti-infectives   Start     Dose/Rate Route Frequency Ordered Stop   02/11/13 1200  piperacillin-tazobactam (ZOSYN) IVPB 3.375 g  Status:  Discontinued     3.375 g 12.5 mL/hr over 240 Minutes Intravenous 3 times per day 02/11/13 1002 02/15/13 0752   02/09/13 0600  cefOXitin (MEFOXIN) 2 g in dextrose 5 % 50 mL IVPB     2 g 100 mL/hr over 30 Minutes Intravenous On call to O.R. 02/08/13 2311 02/08/13 2353   02/09/13 0600  cefOXitin (MEFOXIN) 1 g in dextrose 5 % 50 mL IVPB  Status:  Discontinued     1 g 100 mL/hr over 30 Minutes Intravenous 3 times per day 02/09/13 0337 02/11/13 0953      Scheduled Meds: . antiseptic oral rinse  15 mL Mouth Rinse QID  . chlorhexidine  15 mL Mouth Rinse  BID  . enoxaparin (LOVENOX) injection  20 mg Subcutaneous Q24H  . insulin aspart  0-15 Units Subcutaneous Q4H  . pantoprazole (PROTONIX) IV  40 mg Intravenous Daily  . potassium chloride  20 mEq Per Tube TID  . sodium chloride  10-40 mL Intracatheter Q12H   Continuous Infusions: . dextrose 5 % with KCl 20 mEq / L 20 mEq (02/15/13 0853)  . feeding supplement (VITAL AF 1.2 CAL) 1,000 mL (02/14/13 1713)   PRN Meds:.albuterol, morphine injection, ondansetron, sodium chloride  No Known Allergies  Physical Exam  Vitals  Blood pressure 123/58, pulse 63, temperature 98.1 F (36.7 C), temperature source Oral, resp. rate 16, height 5' (1.524 m), weight 50 kg (110 lb 3.7 oz), SpO2 100.00%.   1. General frail elderly black female lying in bed in NAD,    2. Normal affect and insight, Not Suicidal or Homicidal, Awake Alert, Oriented X 3.  3. No F.N deficits, ALL C.Nerves Intact, Strength 5/5 all 4 extremities, Sensation intact all 4 extremities, Plantars down going.  4. Ears and Eyes appear Normal, Conjunctivae clear, PERRLA. Moist Oral Mucosa.  5. Supple Neck, No JVD, No cervical lymphadenopathy appriciated, No Carotid Bruits.  6. Symmetrical Chest wall movement, Good air movement bilaterally, CTAB. R.IJ  7. RRR, No Gallops, Rubs or Murmurs, No Parasternal Heave.  8. Positive Bowel Sounds, Abdomen Soft, Non tender, No organomegaly appriciated,No rebound -guarding or rigidity. PEG and Foley  9.  No Cyanosis, Normal Skin Turgor, No Skin Rash or Bruise.  10. Good muscle tone,  joints appear normal , no effusions, Normal ROM.  11. No Palpable Lymph Nodes in Neck or Axillae     Data Review  CBC  Recent Labs Lab 02/08/13 2313  02/11/13 0337 02/12/13 0425 02/13/13 02/14/13 0500 02/15/13 0500  WBC 13.2*  < > 5.4 3.8* 4.2 3.2* 4.5  HGB 12.9  < > 9.2* 8.7* 8.4* 8.4* 8.7*  HCT 38.1  < > 27.0* 25.8* 24.3* 24.8* 25.7*  PLT 198  < > 125* 135* 147* 174 193  MCV 83.0  < > 82.3 82.2  82.1 82.4 82.9  MCH 28.1  < > 28.0 27.7 28.4 27.9 28.1  MCHC 33.9  < > 34.1 33.7 34.6 33.9 33.9  RDW 14.6  < > 14.6 14.9 14.9 15.3 15.3  LYMPHSABS 0.6*  --   --   --   --   --   --  MONOABS 0.7  --   --   --   --   --   --   EOSABS 0.0  --   --   --   --   --   --   BASOSABS 0.0  --   --   --   --   --   --   < > = values in this interval not displayed. ------------------------------------------------------------------------------------------------------------------  Chemistries   Recent Labs Lab 02/08/13 2313 02/09/13 0552 02/10/13 0340 02/11/13 0337 02/12/13 0425 02/13/13 02/15/13 0500  NA 138 135 138 135 139 141 146*  K 3.5 3.2* 2.9* 3.5 3.1* 3.6 3.3*  CL 95* 98 108 109 110 114* 116*  CO2 31 26 23 22 21 23 24   GLUCOSE 138* 282* 191* 171* 75 100* 93  BUN 38* 39* 30* 21 15 11 9   CREATININE 0.90 1.05 0.86 0.72 0.82 0.78 0.82  CALCIUM 9.5 8.1* 6.9* 7.1* 6.9* 7.3* 7.0*  MG  --   --   --  1.6 1.5  --  1.4*  AST 25 26  --   --   --  85* 26  ALT 9 7  --   --   --  32 20  ALKPHOS 57 46  --   --   --  154* 118*  BILITOT 0.6 0.5  --   --   --  1.6* 0.5   ------------------------------------------------------------------------------------------------------------------ estimated creatinine clearance is 29.5 ml/min (by C-G formula based on Cr of 0.82). ------------------------------------------------------------------------------------------------------------------ No results found for this basename: TSH, T4TOTAL, FREET3, T3FREE, THYROIDAB,  in the last 72 hours   Coagulation profile No results found for this basename: INR, PROTIME,  in the last 168 hours ------------------------------------------------------------------------------------------------------------------- No results found for this basename: DDIMER,  in the last 72 hours -------------------------------------------------------------------------------------------------------------------  Cardiac Enzymes  Recent  Labs Lab 02/09/13 2040 02/10/13 0340 02/10/13 0945  TROPONINI <0.30 <0.30 <0.30   ------------------------------------------------------------------------------------------------------------------ No components found with this basename: POCBNP,    ---------------------------------------------------------------------------------------------------------------  Urinalysis    Component Value Date/Time   COLORURINE YELLOW 02/15/2013 0755   APPEARANCEUR CLOUDY* 02/15/2013 0755   LABSPEC 1.022 02/15/2013 0755   PHURINE 5.0 02/15/2013 0755   GLUCOSEU NEGATIVE 02/15/2013 0755   HGBUR NEGATIVE 02/15/2013 0755   BILIRUBINUR NEGATIVE 02/15/2013 0755   KETONESUR NEGATIVE 02/15/2013 0755   PROTEINUR NEGATIVE 02/15/2013 0755   UROBILINOGEN 0.2 02/15/2013 0755   NITRITE NEGATIVE 02/15/2013 0755   LEUKOCYTESUR NEGATIVE 02/15/2013 0755     Imaging results:   Chest 2 View  02/08/2013   *RADIOLOGY REPORT*  Clinical Data: Preop small bowel obstruction.  CHEST - 2 VIEW  Comparison: 03/19/2012  Findings: Stable appearance of cardiac pacemaker.  Mild cardiac enlargement with normal pulmonary vascularity.  Emphysematous changes and scattered fibrosis in the lungs.  No focal airspace consolidation.  No blunting of costophrenic angles.  No pneumothorax.  Degenerative changes in the spine.  Residual contrast material in the urinary tract.  IMPRESSION: No evidence of active pulmonary disease.  Emphysematous changes and scattered fibrosis in the lungs.   Original Report Authenticated By: Burman Nieves, [name]   Dg Chest Port 1 View  02/11/2013   *RADIOLOGY REPORT*  Clinical Data: Evaluate ETT, lines  PORTABLE CHEST - 1 VIEW  Comparison: 02/10/2013  Findings: Endotracheal tube terminates 1.5 cm above the carina. Stable right IJ venous catheter.  Chronic interstitial markings.  No focal consolidation. Mild blunting of the bilateral costophrenic angles.  No pneumothorax.  The heart is normal in  size.  Left subclavian  pacemaker.  IMPRESSION: Endotracheal tube terminates 1.5 cm above the carina.  No interval change.   Original Report Authenticated By: Charline Bills, [name]   Portable Chest Xray In Am  02/10/2013   *RADIOLOGY REPORT*  Clinical Data: Evaluate endotracheal tube placement.  PORTABLE CHEST - 1 VIEW  Comparison: 02/09/2013  Findings: Endotracheal tube is 2.6 cm above the carina.  Jugular central line is in the lower SVC region.  There is a dual lead cardiac pacemaker.  Lungs are clear except for mild blunting at the costophrenic angles.  Negative for a pneumothorax.  Heart and mediastinum are within normal limits.  IMPRESSION: Mild blunting at the costophrenic angles could represent atelectasis and trace pleural effusions.  Support apparatuses as described.   Original Report Authenticated By: Richarda Overlie, [name]   Dg Chest Port 1 View  02/09/2013   *RADIOLOGY REPORT*  Clinical Data: Post central line placement  PORTABLE CHEST - 1 VIEW  Comparison: 02/09/2013; 02/08/2013; 03/19/2012  Findings:  Grossly unchanged cardiac silhouette and mediastinal contours given patient rotation.  Minimal atherosclerotic calcifications within the aortic arch.  Interval placement of right jugular approach central venous catheter with tip projecting over the superior cavoatrial junction.  Stable positioning of remaining support apparatus.  No pneumothorax.  The lungs remain hyperexpanded with flattening of bilateral diaphragms.  There is grossly unchanged trace left-sided pleural effusion with associated minimal left basilar heterogeneous opacities.  No new focal airspace opacities. Unchanged bones.  IMPRESSION: 1.  Interval placement of right jugular approach central venous catheter with tip projecting over the superior cavoatrial junction. Otherwise, stable positioning of support apparatus.  No pneumothorax.  2. Unchanged trace left-sided effusion and associated left basilar opacities, atelectasis versus infiltrate.   Original Report  Authenticated By: Tacey Ruiz, MD   Portable Chest Xray  02/09/2013   *RADIOLOGY REPORT*  Clinical Data: Check endotracheal tube placement.  PORTABLE CHEST - 1 VIEW  Comparison: 02/08/2013  Findings: Since previous study, there has been interval placement of an endotracheal tube with tip measuring 3.5 cm from the carina. Stable appearance of the chest otherwise.  Stable appearance of cardiac pacemaker.  Emphysematous changes and scattered fibrosis in the lungs.  No focal consolidation.  No pneumothorax.  Mediastinal contours appear intact.  IMPRESSION: Endotracheal tube tip measures about 3.5 cm above the carina.   Original Report Authenticated By: Burman Nieves, [name]    SIGNIFICANT EVENTS / STUDIES:  6/10 Exploratory lap, hernia repair  6/12 back to OR for stricturoplasty, wound closure (no bowel resection)    LINES / TUBES:  OETT 6/11 >>> 6/13  Foley 6/11 >>>  R IJ CVC 6/11 >>     ANTIBIOTICS:  Cefoxitin 6/11 >>> 6/13  Zosyn 6/13 >> 6/17      Assessment & Plan    1. Hypernatremia and hypokalemia - I have changed her IV fluids to D5W with potassium, will check urine sodium and urine osmolality along with serum osmolality, repeat BMP in the morning. This likely is due to mild dehydration.   2. History of Mobitz type II AV block post pacemaker placement. No acute issues. Continue supportive care.   3. History of GERD. Agree with IV PPI.   4. Advanced age, frail, generalized weakness. PT eval and social work to have a placement.    Note patient has a right IJ central line. We'll request surgery to try and discontinue it once peripheral IVs placed.    DVT Prophylaxis Lovenox   Thank  you for the consult, we will follow the patient with you in the Hospital.   Susa Raring K M.D on 02/15/2013 at 9:42 AM  Between 7am to 7pm - Pager - [phone]  After 7pm go to [email or web address] - password [email or web address]  And look for the night coverage person covering me after  hours   Thank you for the consult, we will follow the patient with you in the [facility].   [facility] Office  [phone]

## 2013-02-15 NOTE — Evaluation (Signed)
Clinical/Bedside Swallow Evaluation Patient Details  Name: Betty [name] MRN: [id number] Date of Birth: Apr 05, 1915  Today's Date: 02/15/2013 Time: 6578-4696 SLP Time Calculation (min): 11 min  Past Medical History:  Past Medical History  Diagnosis Date  . GERD (gastroesophageal reflux disease)   . Complete AV block 03/18/12  . BBB (bundle branch block) 03/18/12    left  . Pacemaker   . Refusal of blood transfusions as patient is Jehovah's Witness    Past Surgical History:  Past Surgical History  Procedure Laterality Date  . No past surgeries    . Pacemaker insertion  03/18/12    initial placement  . Laparotomy N/A 02/09/2013    Procedure: Exploratory Laparotomy; Repair of umbilical hernia; repair of obturator hernia; Placement of wound vac; placement of gastrostomy tube;  Surgeon: [name], MD;  Location: [facility] OR;  Service: General;  Laterality: N/A;  . Colon resection N/A 02/10/2013    Procedure: EXPLORATORY LAPAROTOMY, CLOSURE OF ABDOMEN;  Surgeon: [name], MD;  Location: [facility] OR;  Service: General;  Laterality: N/A;   HPI:  79 y.o. female  transferred from [facility] due to abdominal pain with CT showing SBO with incarcerated right obturator hernia, left femoral hernia, and ventral hernia.  6/10  Exploratory lap, hernia repair;  6/12 back to OR for stricturoplasty, wound closure (no bowel resection.)  Intubated 6/11-13.  G-tube for drainage; enteral feeds started.   Assessment / Plan / Recommendation Clinical Impression  Pt presents with remarkably strong swallow function given advanced age and events of hospitalization.  There is adequate mastication, swift swallow trigger, no overt signs of aspiration post-swallow.  Recommend advancing diet to mechanical soft with thin liquids (hx difficulty chewing tough substances per daughter,)  meds whole with puree, assist with tray set-up as needed.  No SLP f/u warranted.        Diet Recommendation Dysphagia 3 (Mechanical  Soft);Thin liquid   Liquid Administration via: Spoon;Straw Medication Administration: Whole meds with puree Supervision: Patient able to self feed    Other  Recommendations Oral Care Recommendations: Oral care BID   Follow Up Recommendations  None       Pertinent Vitals/Pain No pain    SLP Swallow Goals     Swallow Study Prior Functional Status  Type of Home: House Lives With: Daughter Available Help at Discharge: Family;Available 24 hours/day Vocation: Retired    General Date of Onset: 02/08/13 HPI: 79 y.o. female  transferred from [facility] due to abdominal pain with CT showing SBO with incarcerated right obturator hernia, left femoral hernia, and ventral hernia.  6/10  Exploratory lap, hernia repair;  6/12 back to OR for stricturoplasty, wound closure (no bowel resection.)  Intubated 6/11-13.  G-tube for drainage; enteral feeds started. Type of Study: Bedside swallow evaluation Diet Prior to this Study: NPO Temperature Spikes Noted: No Respiratory Status: Supplemental O2 delivered via (comment) History of Recent Intubation: Yes Length of Intubations (days): 2 days Date extubated: 02/11/13 Behavior/Cognition: Alert;Cooperative;Pleasant mood Oral Cavity - Dentition: Dentures, top;Dentures, bottom Self-Feeding Abilities: Able to feed self Patient Positioning: Upright in bed Baseline Vocal Quality: Clear Volitional Cough: Strong Volitional Swallow: Able to elicit    Oral/Motor/Sensory Function Overall Oral Motor/Sensory Function: Appears within functional limits for tasks assessed   Ice Chips Ice chips: Within functional limits Presentation: Spoon   Thin Liquid Thin Liquid: Within functional limits Presentation: Self Fed;Cup;Straw    Nectar Thick Nectar Thick Liquid: Not tested   Honey Thick Honey Thick Liquid: Not  tested   Puree Puree: Within functional limits Presentation: Self Fed   Solid   GO    Solid: Within functional limits Presentation: Self Fed       Betty [name], [place] CCC/SLP Pager [phone]  Betty [name] Betty [name] 02/15/2013,10:49 AM

## 2013-02-15 NOTE — Progress Notes (Signed)
NUTRITION FOLLOW UP  Intervention:   1.  Enteral nutrition; advance Vital 1.2 @ 20 ml/hr continuous. Do not advance.  Nutrition Dx:   Inadequate oral intake now r/t to post-op ileus.  Ongoing.   Goal:   Initiate nutrition support within next 24-48 hours if prolonged NPO expected. Met, RD to order trickle feeds.   Monitor:   Nutrition support initiation, respiratory status, weight, labs, I/O's  Assessment:   Pt transferred from [facility] due to abdominal pain with CT showing SBO with incarcerated right obturator hernia, left femoral hernia, and ventral hernia.  Pt required prolonged ventilation post-op but has been extubated.  Pt now with post-op ileus preventing diet advancement.    Pt with G-tube to low intermittent suction.   Pt found to meet criteria for severe malnutrition in the context of chronic illness given severe muscle loss (temples, clavicles, shoulders) and severe subcutaneous fat loss (biceps/triceps, below the eyes).  RD consulted for initiation of trickle feeds via G-tube.  Pt is tolerating trickle feeds.  Recommend advancing to 20 ml/hr to support intake as diet progresses.  Will continue to monitor for need for daily advancements based on adequacy of intake. SLP has recommended Dysphagia 3, thin liquids when diet ready for advancement.  Pt trialing clear liquids at time of visit with tolerance.    Height: Ht Readings from Last 1 Encounters:  02/13/13 5' (1.524 m)    Weight Status:   Wt Readings from Last 1 Encounters:  02/15/13 110 lb 3.7 oz (50 kg)  Wt 91 lbs on admission.   Re-estimated needs:  Kcal: 1400-1600 Protein: 65-75g Fluid: >1.5 L/day  Skin: incisions  Diet Order: Clear Liquid   Intake/Output Summary (Last 24 hours) at 02/15/13 1653 Last data filed at 02/15/13 1500  Gross per 24 hour  Intake 895.83 ml  Output    550 ml  Net 345.83 ml    Last BM: PTA, +flatus   Labs:   Recent Labs Lab 02/10/13 0340 02/11/13 0337  02/12/13 0425 02/13/13 02/15/13 0500  NA 138 135 139 141 146*  K 2.9* 3.5 3.1* 3.6 3.3*  CL 108 109 110 114* 116*  CO2 23 22 21 23 24   BUN 30* 21 15 11 9   CREATININE 0.86 0.72 0.82 0.78 0.82  CALCIUM 6.9* 7.1* 6.9* 7.3* 7.0*  MG  --  1.6 1.5  --  1.4*  PHOS  --  1.8* 3.0  --   --   GLUCOSE 191* 171* 75 100* 93    CBG (last 3)   Recent Labs  02/15/13 0758 02/15/13 1137 02/15/13 1631  GLUCAP 88 82 105*    Scheduled Meds: . antiseptic oral rinse  15 mL Mouth Rinse QID  . chlorhexidine  15 mL Mouth Rinse BID  . enoxaparin (LOVENOX) injection  20 mg Subcutaneous Q24H  . feeding supplement (VITAL AF 1.2 CAL)  1,000 mL Per Tube Q24H  . insulin aspart  0-15 Units Subcutaneous Q4H  . pantoprazole (PROTONIX) IV  40 mg Intravenous Daily  . potassium chloride  20 mEq Per Tube TID  . sodium chloride  10-40 mL Intracatheter Q12H    Continuous Infusions: . dextrose 5 % with KCl 20 mEq / L 20 mEq (02/15/13 0853)    Loyce Dys, MS RD LDN Clinical Inpatient Dietitian Pager: [phone] Weekend/After hours pager: [phone]

## 2013-02-15 NOTE — Progress Notes (Signed)
No n/v.  Small bm per nurse Min gastric residual after starting trophic tf yesterday Cleared by speech for po Mag low  abd soft, nd  Adv Tf to 20cc/hr Clears by mouth Replace Mag If tolerates diet over next several days will start placment for LTAC  Kindsey Eblin M. Andrey Campanile, MD, FACS General, Bariatric, & Minimally Invasive Surgery [facility], [place]

## 2013-02-15 NOTE — Evaluation (Signed)
Physical Therapy Evaluation Patient Details Name: Betty [name] MRN: [id number] DOB: 1915/04/07 Today's Date: 02/15/2013 Time: 4098-1191 PT Time Calculation (min): 34 min  PT Assessment / Plan / Recommendation Clinical Impression  Pt is a 79 yo female s/p incarcerated hernia. Pt requires min-mod assistance for transfers and +2 total assist for ambulation. Spoke with daughter regarding d/c planning. At this time, pt is unsafe with ambulation and transfers and would benefit from ST-SNF to improve mobility and return to modI for ADLs. 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 Agreed to await medical status improvements to make d/c recommendations.    PT Assessment  Patient needs continued PT services    Follow Up Recommendations  Other (comment) (to be determined)    Does the patient have the potential to tolerate intense rehabilitation      Barriers to Discharge        Equipment Recommendations  Rolling walker with 5" wheels    Recommendations for Other Services     Frequency Min 3X/week    Precautions / Restrictions Precautions Precautions: Fall (abdominal) Required Braces or Orthoses: Other Brace/Splint Other Brace/Splint: abdominal binder Restrictions Weight Bearing Restrictions: No   Pertinent Vitals/Pain 0/10      Mobility  Bed Mobility Bed Mobility: Rolling Right;Right Sidelying to Sit;Sitting - Scoot to Edge of Bed Rolling Right: 6: Modified independent (Device/Increase time);With rail Right Sidelying to Sit: 3: Mod assist;With rails Sitting - Scoot to Edge of Bed: 6: Modified independent (Device/Increase time);With rail (increased time) Details for Bed Mobility Assistance: Pt requires A to lift trunk from bed Transfers Transfers: Sit to Stand;Stand to Sit;Stand Pivot Transfers Sit to Stand: 3: Mod assist;With upper extremity assist;From bed Stand to Sit: 4: Min assist;With armrests;To  chair/3-in-1 Stand Pivot Transfers: 3: Mod assist (chair to 3 in 1) Details for Transfer Assistance: Pt requires mod A to maintain balance for sit to stand and to move feet for stand pivot transfer Ambulation/Gait Ambulation/Gait Assistance: 1: +2 Total assist Ambulation/Gait: Patient Percentage: 70% Ambulation Distance (Feet): 40 Feet Assistive device: Rolling walker Ambulation/Gait Assistance Details: +2 total for lines and chair follow. Pt required assistance to maintain balance, avoid obstacles and keep RW upright Gait Pattern: Narrow base of support;Lateral trunk lean to right;Lateral trunk lean to left;Shuffle Gait velocity: decreased General Gait Details: Pt weaving with ambulation, SpO2 83%, pt asking to sit "feels a BM occuring." Pt reports she always wears shoes for ambulation and would prefer to use them now. Discussed with daughter- bring shoes for ambulation in hall.    Exercises     PT Diagnosis: Difficulty walking  PT Problem List: Decreased activity tolerance;Decreased balance;Decreased mobility;Decreased knowledge of use of DME PT Treatment Interventions: DME instruction;Gait training;Stair training;Functional mobility training;Therapeutic activities;Therapeutic exercise;Balance training;Neuromuscular re-education;Patient/family education   PT Goals Acute Rehab PT Goals PT Goal Formulation: With patient Time For Goal Achievement: 03/01/13 Potential to Achieve Goals: Good Pt will go Supine/Side to Sit: with modified independence;with HOB 0 degrees PT Goal: Supine/Side to Sit - Progress: Goal set today Pt will go Sit to Stand: with supervision;with upper extremity assist PT Goal: Sit to Stand - Progress: Goal set today Pt will Transfer Bed to Chair/Chair to Bed: with min assist PT Transfer Goal: Bed to Chair/Chair to Bed - Progress: Goal set today Pt will Ambulate: 51 - 150 feet;with supervision;with least restrictive assistive device PT Goal: Ambulate - Progress: Goal  set today Pt will Go Up / Down  Stairs: 6-9 stairs;with min assist;with least restrictive assistive device PT Goal: Up/Down Stairs - Progress: Goal set today  Visit Information  Last PT Received On: 02/15/13 Assistance Needed: +2 (lines and chair follow)    Subjective Data  Subjective: Pt recieved in bed, agreeable to PT   Prior Functioning  Home Living Lives With: Daughter Available Help at Discharge: Family;Available 24 hours/day Type of Home: House Home Access: Stairs to enter [employer] of Steps: 8 small steps Entrance Stairs-Rails: Right;Left Home Layout: One level Bathroom Shower/Tub: Tub/shower unit (pt spnge bathes) Bathroom Toilet: Standard Bathroom Accessibility: Yes How Accessible: Accessible via walker Home Adaptive Equipment: Straight cane;Walker - rolling Prior Function Level of Independence: Independent with assistive device(s) Able to Take Stairs?: Yes Driving: No Vocation: Retired Comments: Daughter reports pt sponge bathes but is able to set up the sponge bath for herself Communication Communication: HOH    Cognition  Cognition Arousal/Alertness: Awake/alert Behavior During Therapy: WFL for tasks assessed/performed Overall Cognitive Status: Within Functional Limits for tasks assessed    Extremity/Trunk Assessment Right Upper Extremity Assessment RUE ROM/Strength/Tone: [facility] for tasks assessed Left Upper Extremity Assessment LUE ROM/Strength/Tone: WFL for tasks assessed Right Lower Extremity Assessment RLE ROM/Strength/Tone: [facility] for tasks assessed Left Lower Extremity Assessment LLE ROM/Strength/Tone: WFL for tasks assessed   Balance    End of Session PT - End of Session Equipment Utilized During Treatment: Gait belt (abdominal binder) Activity Tolerance: Patient tolerated treatment well Patient left: with call bell/phone within reach;with nursing in room (on [facility])  GP     02/15/2013, 9:39 AM Marvis Moeller, Student Physical  Therapist Office #: [phone]   Agree with above assessment.  Lewis Shock, PT, DPT Pager #: [phone] Office #: [phone]

## 2013-02-15 NOTE — Evaluation (Signed)
Physical Therapy Evaluation Patient Details Name: Donnamae Muilenburg MRN: [id number] DOB: 1915-07-27 Today's Date: 02/15/2013 Time: 6578-4696 PT Time Calculation (min): 34 min  PT Assessment / Plan / Recommendation Clinical Impression  Pt is a 79 yo female s/p incarcerated hernia. Pt requires min-mod assistance for transfers and +2 total assist for ambulation. Spoke with daughter regarding d/c planning. At this time, pt is unsafe with ambulation and transfers and would benefit from ST-SNF to improve mobility and return to modI for ADLs. 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 Agreed to await medical status improvements to make d/c recommendations.    PT Assessment  Patient needs continued PT services    Follow Up Recommendations  Other (comment) (to be determined)    Does the patient have the potential to tolerate intense rehabilitation      Barriers to Discharge        Equipment Recommendations  Rolling walker with 5" wheels    Recommendations for Other Services     Frequency Min 3X/week    Precautions / Restrictions Precautions Precautions: Fall (abdominal) Required Braces or Orthoses: Other Brace/Splint Other Brace/Splint: abdominal binder Restrictions Weight Bearing Restrictions: No   Pertinent Vitals/Pain 0/10      Mobility  Bed Mobility Bed Mobility: Rolling Right;Right Sidelying to Sit;Sitting - Scoot to Edge of Bed Rolling Right: 6: Modified independent (Device/Increase time);With rail Right Sidelying to Sit: 3: Mod assist;With rails Sitting - Scoot to Edge of Bed: 6: Modified independent (Device/Increase time);With rail (increased time) Details for Bed Mobility Assistance: Pt requires A to lift trunk from bed Transfers Transfers: Sit to Stand;Stand to Sit;Stand Pivot Transfers Sit to Stand: 3: Mod assist;With upper extremity assist;From bed Stand to Sit: 4: Min assist;With armrests;To  chair/3-in-1 Stand Pivot Transfers: 3: Mod assist (chair to 3 in 1) Details for Transfer Assistance: Pt requires mod A to maintain balance for sit to stand and to move feet for stand pivot transfer Ambulation/Gait Ambulation/Gait Assistance: 1: +2 Total assist Ambulation/Gait: Patient Percentage: 70% Ambulation Distance (Feet): 40 Feet Assistive device: Rolling walker Ambulation/Gait Assistance Details: +2 total for lines and chair follow. Pt required assistance to maintain balance, avoid obstacles and keep RW upright Gait Pattern: Narrow base of support;Lateral trunk lean to right;Lateral trunk lean to left;Shuffle Gait velocity: decreased General Gait Details: Pt weaving with ambulation, SpO2 83%, pt asking to sit "feels a BM occuring." Pt reports she always wears shoes for ambulation and would prefer to use them now. Discussed with daughter- bring shoes for ambulation in hall.    Exercises     PT Diagnosis: Difficulty walking  PT Problem List: Decreased activity tolerance;Decreased balance;Decreased mobility;Decreased knowledge of use of DME PT Treatment Interventions: DME instruction;Gait training;Stair training;Functional mobility training;Therapeutic activities;Therapeutic exercise;Balance training;Neuromuscular re-education;Patient/family education   PT Goals Acute Rehab PT Goals PT Goal Formulation: With patient Time For Goal Achievement: 03/01/13 Potential to Achieve Goals: Good Pt will go Supine/Side to Sit: with modified independence;with HOB 0 degrees PT Goal: Supine/Side to Sit - Progress: Goal set today Pt will go Sit to Stand: with supervision;with upper extremity assist PT Goal: Sit to Stand - Progress: Goal set today Pt will Transfer Bed to Chair/Chair to Bed: with min assist PT Transfer Goal: Bed to Chair/Chair to Bed - Progress: Goal set today Pt will Ambulate: 51 - 150 feet;with supervision;with least restrictive assistive device PT Goal: Ambulate - Progress: Goal  set today Pt will Go Up / Down  Stairs: 6-9 stairs;with min assist;with least restrictive assistive device PT Goal: Up/Down Stairs - Progress: Goal set today  Visit Information  Last PT Received On: 02/15/13 Assistance Needed: +2 (lines and chair follow)    Subjective Data  Subjective: Pt recieved in bed, agreeable to PT   Prior Functioning  Home Living Lives With: Daughter Available Help at Discharge: Family;Available 24 hours/day Type of Home: House Home Access: Stairs to enter [employer] of Steps: 8 small steps Entrance Stairs-Rails: Right;Left Home Layout: One level Bathroom Shower/Tub: Tub/shower unit (pt spnge bathes) Bathroom Toilet: Standard Bathroom Accessibility: Yes How Accessible: Accessible via walker Home Adaptive Equipment: Straight cane;Walker - rolling Prior Function Level of Independence: Independent with assistive device(s) Able to Take Stairs?: Yes Driving: No Vocation: Retired Comments: Daughter reports pt sponge bathes but is able to set up the sponge bath for herself Communication Communication: HOH    Cognition  Cognition Arousal/Alertness: Awake/alert Behavior During Therapy: WFL for tasks assessed/performed Overall Cognitive Status: Within Functional Limits for tasks assessed    Extremity/Trunk Assessment Right Upper Extremity Assessment RUE ROM/Strength/Tone: [facility] for tasks assessed Left Upper Extremity Assessment LUE ROM/Strength/Tone: WFL for tasks assessed Right Lower Extremity Assessment RLE ROM/Strength/Tone: [facility] for tasks assessed Left Lower Extremity Assessment LLE ROM/Strength/Tone: WFL for tasks assessed   Balance    End of Session PT - End of Session Equipment Utilized During Treatment: Gait belt (abdominal binder) Activity Tolerance: Patient tolerated treatment well Patient left: with call bell/phone within reach;with nursing in room (on [facility])  GP     02/15/2013, 9:39 AM Marvis Moeller, Student Physical  Therapist Office #: [phone]

## 2013-02-16 LAB — BASIC METABOLIC PANEL
Calcium: 7.6 mg/dL — ABNORMAL LOW (ref 8.4–10.5)
GFR calc Af Amer: 84 mL/min — ABNORMAL LOW (ref >90)
GFR calc non Af Amer: 72 mL/min — ABNORMAL LOW (ref >90)
Glucose, Bld: 110 mg/dL — ABNORMAL HIGH (ref 70–99)
Potassium: 4.5 mEq/L (ref 3.5–5.1)
Sodium: 139 mEq/L (ref 135–145)

## 2013-02-16 LAB — GLUCOSE, CAPILLARY
Glucose-Capillary: 102 mg/dL — ABNORMAL HIGH (ref 70–99)
Glucose-Capillary: 102 mg/dL — ABNORMAL HIGH (ref 70–99)
Glucose-Capillary: 87 mg/dL (ref 70–99)
Glucose-Capillary: 72 mg/dL (ref 70–99)

## 2013-02-16 LAB — URINE CULTURE
Colony Count: NO GROWTH
Culture: NO GROWTH

## 2013-02-16 LAB — MAGNESIUM: Magnesium: 1.8 mg/dL (ref 1.5–2.5)

## 2013-02-16 MED ORDER — VITAL AF 1.2 CAL PO LIQD
1000.0000 mL | ORAL | Status: DC
  Administered 2013-02-16: 1000 mL
  Filled 2013-02-16 (×4): qty 1000

## 2013-02-16 MED ORDER — PANTOPRAZOLE SODIUM 20 MG PO TBEC
20.0000 mg | DELAYED_RELEASE_TABLET | Freq: Every day | ORAL | Status: DC
  Administered 2013-02-17 – 2013-02-20 (×4): 20 mg via ORAL
  Filled 2013-02-16 (×5): qty 1

## 2013-02-16 NOTE — Progress Notes (Signed)
6 Days Post-Op  Subjective: She looks pretty good.  Needs to get up to BR, says shes had 3 BM's.    Objective: Vital signs in last 24 hours: Temp:  [97.6 F (36.4 C)-98.2 F (36.8 C)] 97.6 F (36.4 C) (06/18 0433) Pulse Rate:  [63-74] 65 (06/18 0433) Resp:  [16-25] 16 (06/18 0433) BP: (123-156)/(53-69) 129/53 mmHg (06/18 0433) SpO2:  [100 %] 100 % (06/18 0433) Weight:  [50 kg (110 lb 3.7 oz)] 50 kg (110 lb 3.7 oz) (06/18 0433) Last BM Date: 02/05/13 PO 120 recorded. TF 350 recorded (REDIDUALS-165@1700 , 110@2030 , 20@mn , 150@0400 ) 875 URINE OUTput recorded yesterday Diet: clears Afebrile, VSS K+ up to 4.5 d/c KCl supplement +BM yesterday Intake/Output from previous day: 06/17 0701 - 06/18 0700 In: 1775.8 [P.O.:120; I.V.:1005.8; NG/GT:350] Out: 875 [Urine:875] Intake/Output this shift:    General appearance: alert, cooperative and no distress Resp: clear to auscultation bilaterally GI: soft, nontender, few BS, + flatus and BM.  Tolerating clears.  Lab Results:   Recent Labs  02/14/13 0500 02/15/13 0500  WBC 3.2* 4.5  HGB 8.4* 8.7*  HCT 24.8* 25.7*  PLT 174 193    BMET  Recent Labs  02/15/13 0500 02/16/13 0500  NA 146* 139  K 3.3* 4.5  CL 116* 113*  CO2 24 23  GLUCOSE 93 110*  BUN 9 8  CREATININE 0.82 0.67  CALCIUM 7.0* 7.6*   PT/INR No results found for this basename: LABPROT, INR,  in the last 72 hours   Recent Labs Lab 02/13/13 02/15/13 0500  AST 85* 26  ALT 32 20  ALKPHOS 154* 118*  BILITOT 1.6* 0.5  PROT 4.2* 4.3*  ALBUMIN 1.3* 1.3*     Lipase  No results found for this basename: lipase     Studies/Results: Dg Chest Port 1 View  02/15/2013   *RADIOLOGY REPORT*  Clinical Data: Cough  PORTABLE CHEST - 1 VIEW  Comparison: 02/11/2013  Findings: The endotracheal tube has been removed.  A pacing device is again noted as is a right central venous line.  Bilateral pleural effusions of a moderate degree are noted these are new from prior exam.   IMPRESSION: New moderate bilateral pleural effusions.   Original Report Authenticated By: Alcide Clever, [name]    Medications: . antiseptic oral rinse  15 mL Mouth Rinse QID  . chlorhexidine  15 mL Mouth Rinse BID  . enoxaparin (LOVENOX) injection  20 mg Subcutaneous Q24H  . feeding supplement (VITAL AF 1.2 CAL)  1,000 mL Per Tube Q24H  . insulin aspart  0-15 Units Subcutaneous Q4H  . pantoprazole (PROTONIX) IV  40 mg Intravenous Daily  . potassium chloride  20 mEq Per Tube TID  . sodium chloride  10-40 mL Intracatheter Q12H    Assessment/Plan 1. Incarcerated right obturator hernia. Left femoral hernia. Periumbilical hernia with incarcerated colon: S/p 1.Exploratory laparotomy with repair of right obturator hernia with biologic mesh.  2. Repair of left femoral hernia with biologic mesh.  3. Vacuum pack closure of abdomen due to intestinal ischemia. 02/09/2013, [name], MD  Exp. Lap; Small bowel stricturoplasty, Abd. Closure, 02/10/2013, [name], MD  Acute respiratory failure (post-op).  Acute encephalopathy. (post op)  Complete AV block with PTVP 03/18/12  Jehovah witness  Anemia  Post op ileus  HYpokalemia (replace K+, check Mag)  ? Urine output (fluid balance is + weight is up, NA is up) Medicine conslt  Did well with swallowing study Hyponatremia, Hypokalemia improved.   Plan:  D/c foley, bedside commode, advance diet, continue to mobilize.Marland Kitchen  Stop K+ supplement.  I will leave TF at 20.   LOS: 8 days    Betty [name] 02/16/2013

## 2013-02-16 NOTE — Progress Notes (Signed)
Soft, nt. Very mild distension Residuals avging 100cc Multiple bms. Tolerating clears  Adv po diet Change TF to night time to promote eating during day If po intake improves can hopefully titrate off enteral feeds Pt/ot  Mary Sella. Andrey Campanile, MD, FACS General, Bariatric, & Minimally Invasive Surgery [facility], [place]

## 2013-02-16 NOTE — Progress Notes (Signed)
NUTRITION FOLLOW UP  Intervention:   1.  Enteral nutrition; Per [facility] discretion.  Note plan to hold at 20 mL/hr continuous today.  2.  Modify diet; as tolerated to Dysphagia 3 goal.   Nutrition Dx:   Inadequate oral intake now r/t to post-op ileus.  Ongoing.   Goal:   Initiate nutrition support within next 24-48 hours if prolonged NPO expected. Met, Trickle feeds ongoing.   Monitor:   Nutrition support initiation, respiratory status, weight, labs, I/O's  Assessment:   Pt transferred from [facility] due to abdominal pain with CT showing SBO with incarcerated right obturator hernia, left femoral hernia, and ventral hernia.  Pt required prolonged ventilation post-op but has been extubated.  Pt now with post-op ileus preventing diet advancement.    Pt found to meet criteria for severe malnutrition in the context of chronic illness given severe muscle loss (temples, clavicles, shoulders) and severe subcutaneous fat loss (biceps/triceps, below the eyes).  Pt continues to tolerate trickle feeds as well as diet advancements.  Recommend advancing by10 ml daily to support intake as diet progresses.  Will continue to monitor for need for daily advancements based on adequacy of intake. +BM yesterday Gastric residuals: 150 mL this AM SLP has recommended Dysphagia 3, thin liquids when diet ready for advancement.  Pt trialing full liquids today.  Height: Ht Readings from Last 1 Encounters:  02/13/13 5' (1.524 m)    Weight Status:   Wt Readings from Last 1 Encounters:  02/16/13 110 lb 3.7 oz (50 kg)  Wt 91 lbs on admission.   Re-estimated needs:  Kcal: 1400-1600 Protein: 65-75g Fluid: >1.5 L/day  Skin: incisions  Diet Order: Full Liquid   Intake/Output Summary (Last 24 hours) at 02/16/13 1252 Last data filed at 02/16/13 1200  Gross per 24 hour  Intake   1970 ml  Output    900 ml  Net   1070 ml    Last BM: 6/17   Labs:   Recent Labs Lab 02/10/13 0340  02/11/13 0337  02/12/13 0425 02/13/13 02/15/13 0500 02/16/13 0500  NA 138  --  135 139 141 146* 139  K 2.9*  --  3.5 3.1* 3.6 3.3* 4.5  CL 108  --  109 110 114* 116* 113*  CO2 23  --  22 21 23 24 23   BUN 30*  --  21 15 11 9 8   CREATININE 0.86  --  0.72 0.82 0.78 0.82 0.67  CALCIUM 6.9*  --  7.1* 6.9* 7.3* 7.0* 7.6*  MG  --   < > 1.6 1.5  --  1.4* 1.8  PHOS  --   --  1.8* 3.0  --   --   --   GLUCOSE 191*  --  171* 75 100* 93 110*  < > = values in this interval not displayed.  CBG (last 3)   Recent Labs  02/16/13 0016 02/16/13 0432 02/16/13 0820  GLUCAP 97 113* 102*    Scheduled Meds: . antiseptic oral rinse  15 mL Mouth Rinse QID  . chlorhexidine  15 mL Mouth Rinse BID  . enoxaparin (LOVENOX) injection  20 mg Subcutaneous Q24H  . feeding supplement (VITAL AF 1.2 CAL)  1,000 mL Per Tube Q24H  . insulin aspart  0-15 Units Subcutaneous Q4H  . pantoprazole (PROTONIX) IV  40 mg Intravenous Daily  . sodium chloride  10-40 mL Intracatheter Q12H    Continuous Infusions:    Loyce Dys, MS RD LDN Clinical Inpatient Dietitian  Pager: [phone] Weekend/After hours pager: [phone]

## 2013-02-16 NOTE — Consult Note (Signed)
[facility] CONSULT f/u Note Betty [name] TEAM 1 - Stepdown/ICU TEAM   Betty [name] ZOX:[id number] DOB: 1914-09-23 DOA: 02/08/2013 PCP: Provider Not In System  Brief narrative: 79 y.o. female with history of Mobitz type II heart block requiring pacemaker placement by [facility], otherwise in extremely good health, who was admitted to the hospital for abdominal pain and was found to have incarcerated umbilical hernia requiring exploratory laparotomy  with colon resection by General Surgery on 02/10/2013.  She was briefly intubated and was followed by Pulmonary Critical Care.  She now has a PEG tube and tube feeds have been started by General Surgery.  [facility] was consulted to manage asymptomatic hypernatremia.    Assessment/Plan:  Incarcerated right obturator hernia, left femoral hernia, periumbilical hernia with incarcerated colon: S/p Exploratory laparotomy with repair  As per primary service  Hypernatremia Resolved w/ free water infusion - most c/w true dehydration - cont to follow trend as pt transitions to oral intake   Hypokalemia Resolved w/ replacement - magnesium within normal range  Mobitz type II AV block status post pacemaker placement  GERD Medical treatment ongoing  Acute encephalopathy - post op Patient is presently at her baseline mental status  Anemia Jehovah witness   Advanced age  DVT prophylaxis: Lovenox  HPI/Subjective: The patient is sitting on the bedside commode at the time of my visit.  She reports that she is having her second small bowel movement of the day.  She denies fevers chills nausea vomiting or significant abdominal pain.  She is alert conversant and delightful.  Objective: Blood pressure 133/62, pulse 88, temperature 97.6 F (36.4 C), temperature source Oral, resp. rate 21, height 5' (1.524 m), weight 50 kg (110 lb 3.7 oz), SpO2 100.00%.  Intake/Output Summary (Last 24 hours) at 02/16/13 1434 Last data filed at 02/16/13 1400  Gross  per 24 hour  Intake   1870 ml  Output    900 ml  Net    970 ml    Exam: General: No acute respiratory distress Lungs: Clear to auscultation bilaterally without wheezes or crackles Cardiovascular: Regular rate and rhythm  Abdomen: soft, bowel sounds hypoactive but positive, no rebound Extremities: No significant cyanosis, clubbing, or edema bilateral lower extremities  Data Reviewed: Basic Metabolic Panel:  Recent Labs Lab 02/10/13 0340 02/11/13 0337 02/12/13 0425 02/13/13 02/15/13 0500 02/16/13 0500  NA 138 135 139 141 146* 139  K 2.9* 3.5 3.1* 3.6 3.3* 4.5  CL 108 109 110 114* 116* 113*  CO2 23 22 21 23 24 23   GLUCOSE 191* 171* 75 100* 93 110*  BUN 30* 21 15 11 9 8   CREATININE 0.86 0.72 0.82 0.78 0.82 0.67  CALCIUM 6.9* 7.1* 6.9* 7.3* 7.0* 7.6*  MG  --  1.6 1.5  --  1.4* 1.8  PHOS  --  1.8* 3.0  --   --   --    Liver Function Tests:  Recent Labs Lab 02/13/13 02/15/13 0500  AST 85* 26  ALT 32 20  ALKPHOS 154* 118*  BILITOT 1.6* 0.5  PROT 4.2* 4.3*  ALBUMIN 1.3* 1.3*   CBC:  Recent Labs Lab 02/11/13 0337 02/12/13 0425 02/13/13 02/14/13 0500 02/15/13 0500  WBC 5.4 3.8* 4.2 3.2* 4.5  HGB 9.2* 8.7* 8.4* 8.4* 8.7*  HCT 27.0* 25.8* 24.3* 24.8* 25.7*  MCV 82.3 82.2 82.1 82.4 82.9  PLT 125* 135* 147* 174 193   Cardiac Enzymes:  Recent Labs Lab 02/09/13 2040 02/10/13 0340 02/10/13 0945  TROPONINI <0.30 <0.30 <  0.30   CBG:  Recent Labs Lab 02/15/13 1631 02/15/13 1953 02/16/13 0016 02/16/13 0432 02/16/13 0820  GLUCAP 105* 96 97 113* 102*    Recent Results (from the past 240 hour(s))  MRSA PCR SCREENING     Status: None   Collection Time    02/09/13  3:40 AM      Result Value Range Status   MRSA by PCR NEGATIVE  NEGATIVE Final   Comment:            The GeneXpert MRSA Assay (FDA     approved for NASAL specimens     only), is one component of a     comprehensive MRSA colonization     surveillance program. It is not     intended to diagnose  MRSA     infection nor to guide or     monitor treatment for     MRSA infections.  URINE CULTURE     Status: None   Collection Time    02/15/13  7:55 AM      Result Value Range Status   Specimen Description URINE, CATHETERIZED   Final   Special Requests NONE   Final   Culture  Setup Time 02/15/2013 09:45   Final   Colony Count NO GROWTH   Final   Culture NO GROWTH   Final   Report Status 02/16/2013 FINAL   Final     Studies:  Recent x-ray studies have been reviewed in detail by the Attending Physician  Scheduled Meds:  Scheduled Meds: . antiseptic oral rinse  15 mL Mouth Rinse QID  . chlorhexidine  15 mL Mouth Rinse BID  . enoxaparin (LOVENOX) injection  20 mg Subcutaneous Q24H  . feeding supplement (VITAL AF 1.2 CAL)  1,000 mL Per Tube Q24H  . insulin aspart  0-15 Units Subcutaneous Q4H  . [START ON 02/17/2013] pantoprazole  20 mg Oral Daily  . sodium chloride  10-40 mL Intracatheter Q12H    Time spent on care of this patient: 25 minutes   [facility]  [facility] Office  [phone] Pager - Text Page per Loretha Stapler as per below:  On-Call/Text Page:      [email or web address]      password [email or web address]  If 7PM-7AM, please contact night-coverage [email or web address] Password [facility] 02/16/2013, 2:34 PM   LOS: 8 days

## 2013-02-17 ENCOUNTER — Inpatient Hospital Stay (HOSPITAL_COMMUNITY): Payer: Medicare Other

## 2013-02-17 LAB — GLUCOSE, CAPILLARY
Glucose-Capillary: 106 mg/dL — ABNORMAL HIGH (ref 70–99)
Glucose-Capillary: 113 mg/dL — ABNORMAL HIGH (ref 70–99)
Glucose-Capillary: 114 mg/dL — ABNORMAL HIGH (ref 70–99)
Glucose-Capillary: 63 mg/dL — ABNORMAL LOW (ref 70–99)
Glucose-Capillary: 67 mg/dL — ABNORMAL LOW (ref 70–99)

## 2013-02-17 LAB — BASIC METABOLIC PANEL
Calcium: 8 mg/dL — ABNORMAL LOW (ref 8.4–10.5)
GFR calc Af Amer: 86 mL/min — ABNORMAL LOW (ref >90)
GFR calc non Af Amer: 74 mL/min — ABNORMAL LOW (ref >90)
Glucose, Bld: 88 mg/dL (ref 70–99)
Potassium: 4.6 mEq/L (ref 3.5–5.1)
Sodium: 141 mEq/L (ref 135–145)

## 2013-02-17 MED ORDER — DEXTROSE 50 % IV SOLN
INTRAVENOUS | Status: AC
  Filled 2013-02-17: qty 50

## 2013-02-17 MED ORDER — DEXTROSE-NACL 5-0.9 % IV SOLN
INTRAVENOUS | Status: DC
  Administered 2013-02-17: 1000 mL via INTRAVENOUS
  Administered 2013-02-17 – 2013-02-21 (×7): via INTRAVENOUS
  Administered 2013-02-22: 20 mL/h via INTRAVENOUS

## 2013-02-17 MED ORDER — DEXTROSE 50 % IV SOLN
25.0000 mL | Freq: Once | INTRAVENOUS | Status: AC | PRN
  Administered 2013-02-17: 25 mL via INTRAVENOUS

## 2013-02-17 NOTE — Progress Notes (Signed)
Hypoglycemic Event  CBG: 63  Treatment: 8oz orange juice  Symptoms: asymptomatic  Follow-up CBG Time: 0100   CBG Result: 67.   **Pt then given 25ml D50 IV. Recheck was 127.   Possible Reasons for Event: IVF dc'd yesterday. Poor fluid intake.  Comments/MD notified: MD notified     Drue Flirt  Remember to initiate Hypoglycemia Order Set & complete

## 2013-02-17 NOTE — Progress Notes (Signed)
Patient ID: Betty [name], female   DOB: 31-Jan-1915, 79 y.o.   MRN: [id number] 7 Days Post-Op  Subjective: Very nauseated and vomiting TFs in her mouth and using suction to get it out, feels very full and feels like a balloon is inflating in her belly, this has been ongoing since yesterday.    Objective: Vital signs in last 24 hours: Temp:  [97 F (36.1 C)-98.1 F (36.7 C)] 97.2 F (36.2 C) (06/19 0759) Pulse Rate:  [74-88] 82 (06/19 0759) Resp:  [15-27] 15 (06/19 0759) BP: (120-139)/(55-86) 125/76 mmHg (06/19 0759) SpO2:  [96 %-100 %] 100 % (06/19 0759) Weight:  [113 lb 5.1 oz (51.4 kg)] 113 lb 5.1 oz (51.4 kg) (06/19 0500) Last BM Date: 02/16/13  06/18 0701 - 06/19 0700 In: 1200 [P.O.:720; NG/GT:480] Out: 752 [Urine:750; Stool:2] Intake/Output this shift: Total I/O In: 30 [I.V.:30] Out: -   General appearance: alert, cooperative and burping and then vomiting up TFs, gagging frequently and having some dry heaves as well Resp: clear to auscultation bilaterally GI: soft, nontender, -BS, 4 bms yesterday, midline wound healthy  Lab Results:   Recent Labs  02/15/13 0500  WBC 4.5  HGB 8.7*  HCT 25.7*  PLT 193    BMET  Recent Labs  02/16/13 0500 02/17/13 0530  NA 139 141  K 4.5 4.6  CL 113* 112  CO2 23 23  GLUCOSE 110* 88  BUN 8 10  CREATININE 0.67 0.62  CALCIUM 7.6* 8.0*   PT/INR No results found for this basename: LABPROT, INR,  in the last 72 hours   Recent Labs Lab 02/13/13 02/15/13 0500  AST 85* 26  ALT 32 20  ALKPHOS 154* 118*  BILITOT 1.6* 0.5  PROT 4.2* 4.3*  ALBUMIN 1.3* 1.3*     Lipase  No results found for this basename: lipase     Studies/Results: Dg Chest Port 1 View  02/15/2013   *RADIOLOGY REPORT*  Clinical Data: Cough  PORTABLE CHEST - 1 VIEW  Comparison: 02/11/2013  Findings: The endotracheal tube has been removed.  A pacing device is again noted as is a right central venous line.  Bilateral pleural effusions of a moderate degree are  noted these are new from prior exam.  IMPRESSION: New moderate bilateral pleural effusions.   Original Report Authenticated By: Alcide Clever, [name]    Medications: . antiseptic oral rinse  15 mL Mouth Rinse QID  . chlorhexidine  15 mL Mouth Rinse BID  . dextrose      . enoxaparin (LOVENOX) injection  20 mg Subcutaneous Q24H  . feeding supplement (VITAL AF 1.2 CAL)  1,000 mL Per Tube Q24H  . insulin aspart  0-15 Units Subcutaneous Q4H  . pantoprazole  20 mg Oral Daily  . sodium chloride  10-40 mL Intracatheter Q12H    Assessment/Plan 1. Incarcerated right obturator hernia. Left femoral hernia. Periumbilical hernia with incarcerated colon: S/p 1.Exploratory laparotomy with repair of right obturator hernia with biologic mesh.  2. Repair of left femoral hernia with biologic mesh.  3. Vacuum pack closure of abdomen due to intestinal ischemia. 02/09/2013, [name], MD  Exp. Lap; Small bowel stricturoplasty, Abd. Closure, 02/10/2013, [name], MD  Acute respiratory failure (post-op).  Acute encephalopathy. (post op)  Complete AV block with PTVP 03/18/12  Jehovah witness  Anemia  Post op ileus  HYpokalemia (replace K+, check Mag)  ? Urine output (fluid balance is + weight is up, NA is up) Medicine conslt  Did well with  swallowing study Hyponatremia, Hypokalemia improved.   Plan:  Unfortunately she has developed significant nausea and vomiting, although residual is low this AM i wonder how much of that she has vomited, will hold the TFs for now, NPO, abd films, will dicuss with Dr. Andrey Campanile as to next step, ?TNA, dietary consult for calorie count, ?NGT if vomiting persist  LOS: 9 days    Tuesday Terlecki 02/17/2013

## 2013-02-17 NOTE — Progress Notes (Signed)
Inpatient Diabetes Program Recommendations  AACE/ADA: New Consensus Statement on Inpatient Glycemic Control (2013)  Target Ranges:  Prepandial:   less than 140 mg/dL      Peak postprandial:   less than 180 mg/dL (1-2 hours)      Critically ill patients:  140 - 180 mg/dL  Results for Betty [name], Betty [name] (MRN [id number]) as of 02/17/2013 09:26  Ref. Range 02/17/2013 00:26 02/17/2013 01:01 02/17/2013 01:30 02/17/2013 04:06 02/17/2013 07:58  Glucose-Capillary Latest Range: 70-99 mg/dL 63 (L) 67 (L) 132 (H) 97 95   Inpatient Diabetes Program Recommendations Correction (SSI): Discontinue Novolog  Thank you  Piedad Climes BSN, RN,CDE Inpatient Diabetes Coordinator [phone] (team pager)

## 2013-02-17 NOTE — Consult Note (Addendum)
[facility] CONSULT f/u Note [place] TEAM 1 - Stepdown/ICU TEAM   Laiba Fuerte ZOX:[id number] DOB: Oct 17, 1914 DOA: 02/08/2013 PCP: Provider Not In System  Brief narrative: 79 y.o. female with history of Mobitz type II heart block requiring pacemaker placement by [facility], otherwise in extremely good health, who was admitted to the hospital for abdominal pain and was found to have incarcerated umbilical hernia requiring exploratory laparotomy  with colon resection by General Surgery on 02/10/2013.  She was briefly intubated and was followed by Pulmonary Critical Care.  She now has a PEG tube and tube feeds have been started by General Surgery.  [facility] was consulted to manage asymptomatic hypernatremia.    Assessment/Plan:  Incarcerated right obturator hernia, left femoral hernia, periumbilical hernia with incarcerated colon: S/p Exploratory laparotomy with repair  As per primary service  Hypernatremia Resolved w/ free water infusion - most c/w true dehydration -Pt now on NS - follow for increase in sodium- if sodium begins to rise recommend transition to 1/2 NS  Hypokalemia Resolved w/ replacement - magnesium within normal range  Mobitz type II AV block status post pacemaker placement  GERD Medical treatment ongoing  Acute encephalopathy - post op Patient is presently at her baseline mental status  Anemia Jehovah witness   Advanced age  DVT prophylaxis: Lovenox  HPI/Subjective: The patient had some vomiting today but now feeling better. No abd pain.   Objective: Blood pressure 125/57, pulse 85, temperature 98.5 F (36.9 C), temperature source Oral, resp. rate 21, height 5' (1.524 m), weight 51.4 kg (113 lb 5.1 oz), SpO2 98.00%.  Intake/Output Summary (Last 24 hours) at 02/17/13 1406 Last data filed at 02/17/13 1300  Gross per 24 hour  Intake   1050 ml  Output    351 ml  Net    699 ml    Exam: General: No acute respiratory distress Lungs: Clear to  auscultation bilaterally without wheezes or crackles Cardiovascular: Regular rate and rhythm  Abdomen: soft, bowel sounds are active, no rebound Extremities: No significant cyanosis, clubbing, or edema bilateral lower extremities  Data Reviewed: Basic Metabolic Panel:  Recent Labs Lab 02/11/13 0337 02/12/13 0425 02/13/13 02/15/13 0500 02/16/13 0500 02/17/13 0530  NA 135 139 141 146* 139 141  K 3.5 3.1* 3.6 3.3* 4.5 4.6  CL 109 110 114* 116* 113* 112  CO2 22 21 23 24 23 23   GLUCOSE 171* 75 100* 93 110* 88  BUN 21 15 11 9 8 10   CREATININE 0.72 0.82 0.78 0.82 0.67 0.62  CALCIUM 7.1* 6.9* 7.3* 7.0* 7.6* 8.0*  MG 1.6 1.5  --  1.4* 1.8  --   PHOS 1.8* 3.0  --   --   --   --    Liver Function Tests:  Recent Labs Lab 02/13/13 02/15/13 0500  AST 85* 26  ALT 32 20  ALKPHOS 154* 118*  BILITOT 1.6* 0.5  PROT 4.2* 4.3*  ALBUMIN 1.3* 1.3*   CBC:  Recent Labs Lab 02/11/13 0337 02/12/13 0425 02/13/13 02/14/13 0500 02/15/13 0500  WBC 5.4 3.8* 4.2 3.2* 4.5  HGB 9.2* 8.7* 8.4* 8.4* 8.7*  HCT 27.0* 25.8* 24.3* 24.8* 25.7*  MCV 82.3 82.2 82.1 82.4 82.9  PLT 125* 135* 147* 174 193   Cardiac Enzymes: No results found for this basename: CKTOTAL, CKMB, CKMBINDEX, TROPONINI,  in the last 168 hours CBG:  Recent Labs Lab 02/17/13 0101 02/17/13 0130 02/17/13 0406 02/17/13 0758 02/17/13 1214  GLUCAP 67* 127* 97 95 106*  Recent Results (from the past 240 hour(s))  MRSA PCR SCREENING     Status: None   Collection Time    02/09/13  3:40 AM      Result Value Range Status   MRSA by PCR NEGATIVE  NEGATIVE Final   Comment:            The GeneXpert MRSA Assay (FDA     approved for NASAL specimens     only), is one component of a     comprehensive MRSA colonization     surveillance program. It is not     intended to diagnose MRSA     infection nor to guide or     monitor treatment for     MRSA infections.  URINE CULTURE     Status: None   Collection Time    02/15/13   7:55 AM      Result Value Range Status   Specimen Description URINE, CATHETERIZED   Final   Special Requests NONE   Final   Culture  Setup Time 02/15/2013 09:45   Final   Colony Count NO GROWTH   Final   Culture NO GROWTH   Final   Report Status 02/16/2013 FINAL   Final     Studies:  Recent x-ray studies have been reviewed in detail by the Attending Physician  Scheduled Meds:  Scheduled Meds: . antiseptic oral rinse  15 mL Mouth Rinse QID  . chlorhexidine  15 mL Mouth Rinse BID  . enoxaparin (LOVENOX) injection  20 mg Subcutaneous Q24H  . feeding supplement (VITAL AF 1.2 CAL)  1,000 mL Per Tube Q24H  . insulin aspart  0-15 Units Subcutaneous Q4H  . pantoprazole  20 mg Oral Daily  . sodium chloride  10-40 mL Intracatheter Q12H    Time spent on care of this patient: 25 minutes- We will sign off at this time. Please re-consult if needed.    Calvert Cantor, MD [phone]  [facility] Office  [phone] Pager - Text Page per Amion as per below:  On-Call/Text Page:      [email or web address]      password [email or web address]  If 7PM-7AM, please contact night-coverage [email or web address] Password [email or web address] 02/17/2013, 2:06 PM   LOS: 9 days

## 2013-02-17 NOTE — Plan of Care (Signed)
Problem: Phase III Progression Outcomes Goal: Activity at appropriate level-compared to baseline (UP IN CHAIR FOR HEMODIALYSIS)  Outcome: Progressing Pt ambulating in room

## 2013-02-17 NOTE — Progress Notes (Addendum)
NUTRITION FOLLOW UP  Intervention:   1.  Enteral nutrition; Per CCS discretion. Once gut motility vs. reflux better understood, recommend resuming trial of trophic feeds @ 10 mL/hr continuous 2.  Modify diet; resume as ileus improves.   Recommend no carbonated beverages or orange juice as first foods.  3.  Calorie count; will hold calorie count as pt is not able to take POs or TF at this time. 4.  Parenteral nutrition; TPN per MD/PharmD.  Question whether trickle EN could be re-attempted in the next 12-24 hrs prior to starting TPN.   Nutrition Dx:   Inadequate oral intake now r/t to post-op ileus.  Ongoing.   Goal:   Initiate nutrition support within next 24-48 hours if prolonged NPO expected. Met, Trickle feeds ongoing.   Monitor:   Nutrition support initiation, respiratory status, weight, labs, I/O's  Assessment:   Pt with decreased tolerance of diet this AM.  Pt reports that since yesterday afternoon she has had increased burping/belching and a "rumbly tummy" with some minor cramping in her sides.  Going to the bathroom or passing gas makes her feel better.  Pt reportedly vomited this AM.  Per her report she did not vomit, but had "really bad reflux" that tasted bitter.  She was able to collect all the reflux using her wand which is collected in container and is <100 mL.  Pt was started on Protonix this AM.  Pt asks for applesauce at time of visit.  Noted pt with orange juice and ginger ale on bedside table this morning.  Recommended that once diet is resumed we eliminate carbonated beverages and acidic juice (orange juice) for better possible tolerance.  +BM yesterday and this morning Gastric residuals: 20 mL this AM  Pt found to meet criteria for severe malnutrition in the context of chronic illness given severe muscle loss (temples, clavicles, shoulders) and severe subcutaneous fat loss (biceps/triceps, below the eyes).  Height: Ht Readings from Last 1 Encounters:  02/13/13 5' (1.524  m)    Weight Status:   Wt Readings from Last 1 Encounters:  02/17/13 113 lb 5.1 oz (51.4 kg)  Wt 91 lbs on admission.   Re-estimated needs:  Kcal: 1400-1600 Protein: 65-75g Fluid: >1.5 L/day  Skin: incisions  Diet Order:     Intake/Output Summary (Last 24 hours) at 02/17/13 1027 Last data filed at 02/17/13 1006  Gross per 24 hour  Intake   1210 ml  Output    552 ml  Net    658 ml    Last BM: 6/19   Labs:   Recent Labs Lab 02/11/13 0337 02/12/13 0425  02/15/13 0500 02/16/13 0500 02/17/13 0530  NA 135 139  < > 146* 139 141  K 3.5 3.1*  < > 3.3* 4.5 4.6  CL 109 110  < > 116* 113* 112  CO2 22 21  < > 24 23 23   BUN 21 15  < > 9 8 10   CREATININE 0.72 0.82  < > 0.82 0.67 0.62  CALCIUM 7.1* 6.9*  < > 7.0* 7.6* 8.0*  MG 1.6 1.5  --  1.4* 1.8  --   PHOS 1.8* 3.0  --   --   --   --   GLUCOSE 171* 75  < > 93 110* 88  < > = values in this interval not displayed.  CBG (last 3)   Recent Labs  02/17/13 0130 02/17/13 0406 02/17/13 0758  GLUCAP 127* 97 95    Scheduled Meds: .  antiseptic oral rinse  15 mL Mouth Rinse QID  . chlorhexidine  15 mL Mouth Rinse BID  . dextrose      . enoxaparin (LOVENOX) injection  20 mg Subcutaneous Q24H  . feeding supplement (VITAL AF 1.2 CAL)  1,000 mL Per Tube Q24H  . insulin aspart  0-15 Units Subcutaneous Q4H  . pantoprazole  20 mg Oral Daily  . sodium chloride  10-40 mL Intracatheter Q12H    Continuous Infusions: . dextrose 5 % and 0.9% NaCl 1,000 mL (02/17/13 1006)    Loyce Dys, MS RD LDN Clinical Inpatient Dietitian Pager: [phone] Weekend/After hours pager: [phone]

## 2013-02-17 NOTE — Progress Notes (Signed)
1450:  Pt denies complaints of nausea or vomiting.  1200:  Post tube feed placed on hold, pt has not experienced nausea or vomiting. Nurse will continue to monitor.  1610:  Pt experiencing nausea with scant vomiting.  MD at bedside

## 2013-02-17 NOTE — Progress Notes (Signed)
Nurse confirms suctioning out what appeared to be TF in mouth. Has had small pellet type BMs today. Xray showed some dilated loops of bowel suggestive of ileus  Alert, nad Soft, nd. nt  Will put g tube to gravity overnight and monitor output tonight Cont TPN If doesn't open in next day or so, will plan ct abd/pelvis with contrast via g tube  Mary Sella. Andrey Campanile, MD, FACS General, Bariatric, & Minimally Invasive Surgery [facility], [place]

## 2013-02-17 NOTE — Progress Notes (Signed)
Occupational Therapy Evaluation Patient Details Name: Betty [name] MRN: [id number] DOB: [birth year] Today's Date: 02/17/2013 Time: 4098-1191 OT Time Calculation (min): 19 min  OT Assessment / Plan / Recommendation Clinical Impression  89 YOM with ho GERD, prior av block and BBB presenting with abd pain, transfer from OSH with incarcerated R obturator hernia.Underwent exp lap, hernia repair. Pt with acute encephalopathy, ARF. PTA, pt independent with all ADL and mod I with mobility @ [place] level. Pt has very supportive family who can provide 24/7 care. Pt will be appropriate to D/C home with 24/7 assistance and HHOT/PT. Pt will benefit from skilled OT services to facilitate D/C to home due to below deficits.     OT Assessment  Patient needs continued OT Services    Follow Up Recommendations  Home health OT    Barriers to Discharge None    Equipment Recommendations  3 in 1 bedside comode (RW)    Recommendations for Other Services    Frequency  Min 2X/week    Precautions / Restrictions Precautions Precautions: Fall (abdominal) Precaution Comments: abdominal binder Required Braces or Orthoses: Other Brace/Splint Other Brace/Splint: abdominal binder Restrictions Weight Bearing Restrictions: No   Pertinent Vitals/Pain no apparent distress. C/o nausea at times     ADL  Eating/Feeding: Other (comment) (liquids) Grooming: Set up;Supervision/safety Where Assessed - Grooming: Supported sitting Upper Body Bathing: Minimal assistance Where Assessed - Upper Body Bathing: Supported sitting Lower Body Bathing: Moderate assistance Where Assessed - Lower Body Bathing: Supported sit to stand Upper Body Dressing: Minimal assistance Where Assessed - Upper Body Dressing: Supported sitting Lower Body Dressing: Moderate assistance Where Assessed - Lower Body Dressing: Supported sit to Pharmacist, hospital: Moderate assistance Toilet Transfer Method: Sit to Barista: Other  (comment) (bed - chair) Toileting - Clothing Manipulation and Hygiene: Minimal assistance Where Assessed - Toileting Clothing Manipulation and Hygiene: Sit to stand from 3-in-1 or toilet Transfers/Ambulation Related to ADLs: mod A ADL Comments: limited by nausea and weakness    OT Diagnosis: Generalized weakness;Acute pain  OT Problem List: Decreased strength;Decreased activity tolerance;Impaired balance (sitting and/or standing);Decreased safety awareness;Decreased knowledge of use of DME or AE;Cardiopulmonary status limiting activity;Increased edema OT Treatment Interventions: Self-care/ADL training;Therapeutic exercise;Energy conservation;DME and/or AE instruction;Therapeutic activities;Patient/family education;Balance training   OT Goals Acute Rehab OT Goals OT Goal Formulation: With patient Time For Goal Achievement: 03/03/13 Potential to Achieve Goals: Good ADL Goals Pt Will Perform Lower Body Bathing: with supervision;with set-up;with caregiver independent in assisting;Sit to stand from chair ADL Goal: Lower Body Bathing - Progress: Goal set today Pt Will Perform Lower Body Dressing: with set-up;with supervision;with caregiver independent in assisting;Sit to stand from chair ADL Goal: Lower Body Dressing - Progress: Goal set today Pt Will Transfer to Toilet: with supervision;with DME;Ambulation;3-in-1 ADL Goal: Toilet Transfer - Progress: Goal set today Pt Will Perform Toileting - Clothing Manipulation: with supervision;Sitting on 3-in-1 or toilet;Standing ADL Goal: Toileting - Clothing Manipulation - Progress: Goal set today Pt Will Perform Toileting - Hygiene: with supervision;Sit to stand from 3-in-1/toilet ADL Goal: Toileting - Hygiene - Progress: Goal set today Additional ADL Goal #1: Family will verbalize importance of providing 24/7 supervision and plan for preventing falls. ADL Goal: Additional Goal #1 - Progress: Goal set today  Visit Information  Last OT Received On:  02/17/13 Assistance Needed: +2 (lines and chair follow) PT/OT Co-Evaluation/Treatment: Yes    Subjective Data      Prior Functioning     Home Living Lives With: Daughter Available Help at  Discharge: Family;Available 24 hours/day Type of Home: House Home Access: Stairs to enter [employer] of Steps: 8 small steps Entrance Stairs-Rails: Right;Left Home Layout: One level Bathroom Shower/Tub: Tub/shower unit (pt spnge bathes) Bathroom Toilet: Standard Bathroom Accessibility: Yes How Accessible: Accessible via walker Home Adaptive Equipment: Straight cane Prior Function Level of Independence: Independent with assistive device(s) Able to Take Stairs?: Yes Driving: No Vocation: Retired Musician: [employer]         Vision/Perception     Copywriter, advertising Arousal/Alertness: Awake/alert Behavior During Therapy: WFL for tasks assessed/performed Overall Cognitive Status: Within Functional Limits for tasks assessed    Extremity/Trunk Assessment Right Upper Extremity Assessment RUE ROM/Strength/Tone: [facility] for tasks assessed Left Upper Extremity Assessment LUE ROM/Strength/Tone: WFL for tasks assessed Right Lower Extremity Assessment RLE ROM/Strength/Tone: [facility] for tasks assessed Left Lower Extremity Assessment LLE ROM/Strength/Tone: WFL for tasks assessed     Mobility Bed Mobility Bed Mobility: Rolling Right;Right Sidelying to Sit;Sitting - Scoot to Edge of Bed Rolling Right: 6: Modified independent (Device/Increase time);With rail Right Sidelying to Sit: With rails;4: Min assist Sitting - Scoot to Edge of Bed: 6: Modified independent (Device/Increase time);With rail (increased time) Details for Bed Mobility Assistance: Pt requires A to lift trunk from bed Transfers Transfers: Sit to Stand;Stand to Sit Sit to Stand: 3: Mod assist;With upper extremity assist;From bed Stand to Sit: 4: Min assist;With armrests;To chair/3-in-1 Details for  Transfer Assistance: Pt requires mod A to maintain balance for sit to stand and to move feet for stand pivot transfer     Exercise     Balance  min A   End of Session OT - End of Session Activity Tolerance: Other (comment) (limited due to c/o nausea) Patient left: in chair;with call bell/phone within reach;with family/visitor present Nurse Communication: Mobility status  GO     Aeron Donaghey,HILLARY 02/17/2013, 12:29 PM [facility], OTR/L  [phone] 02/17/2013

## 2013-02-17 NOTE — Progress Notes (Addendum)
Physical Therapy Treatment Patient Details Name: Betty [name] MRN: [id number] DOB: 09/02/1914 Today's Date: 02/17/2013 Time: 4098-1191 PT Time Calculation (min): 23 min  PT Assessment / Plan / Recommendation Comments on Treatment Session  Pt able to increase ambulation distand short amount today.  No RW available therefore performed with bil HHA.    Pt & daughter state she will have 24 hr (A) at home.  If pt cont's to demonstrate progress with mobility she will be appropriate to d/c home with HHPT, HHOT.  She will also need a RW & 3-in-1.       Follow Up Recommendations  Home health PT;Supervision/Assistance - 24 hour     Does the patient have the potential to tolerate intense rehabilitation     Barriers to Discharge        Equipment Recommendations  Rolling walker with 5" wheels (3-in-1)    Recommendations for Other Services    Frequency Min 3X/week   Plan      Precautions / Restrictions Precautions Precautions: Fall Precaution Comments: abdominal binder Required Braces or Orthoses: Other Brace/Splint Other Brace/Splint: abdominal binder Restrictions Weight Bearing Restrictions: No       Mobility  Bed Mobility Bed Mobility: Rolling Right;Right Sidelying to Sit;Sitting - Scoot to Edge of Bed Rolling Right: 4: Min assist;With rail Right Sidelying to Sit: 4: Min assist;HOB flat;With rails Sitting - Scoot to Edge of Bed: 6: Modified independent (Device/Increase time) Details for Bed Mobility Assistance: Cues for sequencing & technique.  (A) to initiate rolling, move LE's over EOB, & to lift shoulders/trunk to sitting upright.   Transfers Sit to Stand: 3: Mod assist;With upper extremity assist;From bed Stand to Sit: 4: Min assist;With upper extremity assist;With armrests;To chair/3-in-1 Details for Transfer Assistance: Cues for hand placement & technique.  (A) to achieve standing, balance, & controlled descent.   Ambulation/Gait Ambulation/Gait Assistance: 1: +2 Total  assist Ambulation/Gait: Patient Percentage: 70% Ambulation Distance (Feet): 60 Feet Assistive device: 2 person hand held assist Ambulation/Gait Assistance Details: Ambulated with bil HHA due to RW unavailable at time.  (A) required for balance & safety.  Pt with very slow gait speed at beginining but she was then able to increase speed as she was heading back into room.   Gait Pattern: Step-through pattern;Decreased stride length;Lateral trunk lean to right;Lateral trunk lean to left Gait velocity: decreased Stairs: No Wheelchair Mobility Wheelchair Mobility: No      PT Goals Acute Rehab PT Goals Time For Goal Achievement: 03/01/13 Potential to Achieve Goals: Good Pt will go Supine/Side to Sit: with modified independence;with HOB 0 degrees PT Goal: Supine/Side to Sit - Progress: Progressing toward goal Pt will go Sit to Stand: with supervision;with upper extremity assist PT Goal: Sit to Stand - Progress: Progressing toward goal Pt will Transfer Bed to Chair/Chair to Bed: with min assist Pt will Ambulate: 51 - 150 feet;with supervision;with least restrictive assistive device PT Goal: Ambulate - Progress: Progressing toward goal Pt will Go Up / Down Stairs: 6-9 stairs;with min assist;with least restrictive assistive device  Visit Information  Last PT Received On: 02/17/13 Assistance Needed: +2 (lines & chair)    Subjective Data      Cognition  Cognition Arousal/Alertness: Awake/alert Behavior During Therapy: WFL for tasks assessed/performed Overall Cognitive Status: Within Functional Limits for tasks assessed    Balance     End of Session PT - End of Session Equipment Utilized During Treatment: Gait belt Activity Tolerance: Patient tolerated treatment well Patient left: in chair;with call bell/phone  within reach;with family/visitor present Nurse Communication: Mobility status     Verdell Face, [name] [phone] 02/17/2013

## 2013-02-18 LAB — GLUCOSE, CAPILLARY: Glucose-Capillary: 123 mg/dL — ABNORMAL HIGH (ref 70–99)

## 2013-02-18 MED ORDER — VITAL AF 1.2 CAL PO LIQD
1000.0000 mL | ORAL | Status: DC
  Administered 2013-02-19 – 2013-02-20 (×2): 1000 mL
  Filled 2013-02-18 (×5): qty 1000

## 2013-02-18 NOTE — Progress Notes (Signed)
Physical Therapy Treatment Patient Details Name: Betty [name] MRN: [id number] DOB: 1914-12-29 Today's Date: 02/18/2013 Time: 1478-2956 PT Time Calculation (min): 23 min  PT Assessment / Plan / Recommendation Comments on Treatment Session  Pt. able to use RW today. Treatment was limited by pt.'s fatigue and general weakness.    Follow Up Recommendations  Home health PT;Supervision/Assistance - 24 hour     Does the patient have the potential to tolerate intense rehabilitation     Barriers to Discharge        Equipment Recommendations  Rolling walker with 5" wheels    Recommendations for Other Services    Frequency Min 3X/week   Plan      Precautions / Restrictions Precautions Precautions: Fall Precaution Comments: abdominal binder Required Braces or Orthoses: Other Brace/Splint Other Brace/Splint: abdominal binder Restrictions Weight Bearing Restrictions: No   Pertinent Vitals/Pain Patient made no c/o pain during today's Tx.    Mobility  Bed Mobility Bed Mobility: Rolling Right;Right Sidelying to Sit;Sitting - Scoot to [place] of Bed Rolling Right: 4: Min assist;With rail Right Sidelying to Sit: 4: Min assist;With rails;HOB elevated Sitting - Scoot to [place] of Bed: 6: Modified independent (Device/Increase time) Details for Bed Mobility Assistance: Pt requires cueing for sequencing and A to lift trunk and lower legs off of EOB.  Transfers Transfers: Sit to Stand;Stand to Sit Sit to Stand: 4: Min assist;With upper extremity assist;From bed Stand to Sit: 4: Min assist;With upper extremity assist;With armrests;To chair/3-in-1 Details for Transfer Assistance: Pt required cues for proper technique to achieve standing while safely maintaining her balance.  Ambulation/Gait Ambulation/Gait Assistance: 3: Mod assist Ambulation Distance (Feet): 50 Feet Assistive device: Rolling walker Ambulation/Gait Assistance Details: Pt. required cues to maintain safe distance from RW and proper  pacing for effort and distance.  Gait Pattern: Step-through pattern;Decreased stride length;Lateral trunk lean to right;Lateral trunk lean to left Gait velocity: decreased Stairs: No    Exercises      PT Goals Acute Rehab PT Goals Time For Goal Achievement: 03/01/13 Potential to Achieve Goals: Good Pt will go Supine/Side to Sit: with modified independence;with HOB 0 degrees PT Goal: Supine/Side to Sit - Progress: Progressing toward goal Pt will go Sit to Stand: with supervision;with upper extremity assist PT Goal: Sit to Stand - Progress: Progressing toward goal Pt will Transfer Bed to Chair/Chair to Bed: with min assist Pt will Ambulate: 51 - 150 feet;with supervision;with least restrictive assistive device PT Goal: Ambulate - Progress: Progressing toward goal Pt will Go Up / Down Stairs: 6-9 stairs;with min assist;with least restrictive assistive device  Visit Information  Last PT Received On: 02/18/13 Assistance Needed: +2    Subjective Data  Subjective: Pt recieved in bed, agreeable to PT   Cognition  Cognition Arousal/Alertness: Awake/alert Behavior During Therapy: WFL for tasks assessed/performed Overall Cognitive Status: Within Functional Limits for tasks assessed    Balance     End of Session PT - End of Session Equipment Utilized During Treatment: Gait belt Activity Tolerance: Patient limited by fatigue Patient left: in chair;with call bell/phone within reach;with family/visitor present   GP     Jolyn Nap, SPTA 02/18/2013, 3:10 PM

## 2013-02-18 NOTE — Progress Notes (Addendum)
Post ambulation with PT HR dropped mid 40's.  Currently ranging high 40's-70's.  Soft BP, lowest systolic 93.  Pt is asymptomatic. Nurse text paged MD and contacted primary care provider office with [facility].  Nurse will continue to monitor.  MD Rizwan returned nurse page, at this time no interventions are necessary, nurse should continue to monitor.

## 2013-02-18 NOTE — Progress Notes (Signed)
Patient ID: Betty [name], female   DOB: 29-Jul-1915, 79 y.o.   MRN: [id number] 8 Days Post-Op  Subjective: Nausea and vomiting resolved since she was on TFs and liquids, min abdomial pain, +bm formed  Objective: Vital signs in last 24 hours: Temp:  [97.4 F (36.3 C)-98.5 F (36.9 C)] 97.8 F (36.6 C) (06/20 0754) Pulse Rate:  [73-85] 75 (06/20 0754) Resp:  [15-38] 38 (06/20 0754) BP: (109-135)/(57-68) 112/61 mmHg (06/20 0754) SpO2:  [98 %-100 %] 100 % (06/20 0754) Weight:  [119 lb 7.8 oz (54.2 kg)] 119 lb 7.8 oz (54.2 kg) (06/20 0454) Last BM Date: 02/16/13  06/19 0701 - 06/20 0700 In: 2170 [I.V.:2130; NG/GT:40] Out: 850 [Urine:800; Emesis/NG output:50] Intake/Output this shift:    General appearance: alert,NAD Resp: clear to auscultation bilaterally GI: soft, nontender, midline wound healthy  Lab Results:  No results found for this basename: WBC, HGB, HCT, PLT,  in the last 72 hours  BMET  Recent Labs  02/16/13 0500 02/17/13 0530  NA 139 141  K 4.5 4.6  CL 113* 112  CO2 23 23  GLUCOSE 110* 88  BUN 8 10  CREATININE 0.67 0.62  CALCIUM 7.6* 8.0*   PT/INR No results found for this basename: LABPROT, INR,  in the last 72 hours   Recent Labs Lab 02/13/13 02/15/13 0500  AST 85* 26  ALT 32 20  ALKPHOS 154* 118*  BILITOT 1.6* 0.5  PROT 4.2* 4.3*  ALBUMIN 1.3* 1.3*     Lipase  No results found for this basename: lipase     Studies/Results: Dg Abd Portable 1v  02/17/2013   *RADIOLOGY REPORT*  Clinical Data: Nausea and vomiting  PORTABLE ABDOMEN - 1 VIEW  Comparison: CT 02/08/2013  Findings: Distended large and small bowel loops.  Improvement in small bowel obstruction compared with the prior study.  Upright view not obtained.  Gastric feeding tube is present in the left abdomen.  IMPRESSION: Improvement in small bowel obstruction.  Question ileus.   Original Report Authenticated By: [name], [name]    Medications: . antiseptic oral rinse  15 mL Mouth Rinse QID   . chlorhexidine  15 mL Mouth Rinse BID  . enoxaparin (LOVENOX) injection  20 mg Subcutaneous Q24H  . feeding supplement (VITAL AF 1.2 CAL)  1,000 mL Per Tube Q24H  . insulin aspart  0-15 Units Subcutaneous Q4H  . pantoprazole  20 mg Oral Daily  . sodium chloride  10-40 mL Intracatheter Q12H    Assessment/Plan 1. Incarcerated right obturator hernia. Left femoral hernia. Periumbilical hernia with incarcerated colon: S/p 1.Exploratory laparotomy with repair of right obturator hernia with biologic mesh.  2. Repair of left femoral hernia with biologic mesh.  3. Vacuum pack closure of abdomen due to intestinal ischemia. 02/09/2013, [name], MD  Exp. Lap; Small bowel stricturoplasty, Abd. Closure, 02/10/2013, [name], MD  Acute respiratory failure (post-op).  Acute encephalopathy. (post op)  Complete AV block with PTVP 03/18/12  Jehovah witness  Anemia  Post op ileus  HYpokalemia (replace K+, check Mag)  ? Urine output (fluid balance is + weight is up, NA is up) Medicine conslt  Did well with swallowing study Hyponatremia, Hypokalemia improved.   Plan:  N/v resoled now that she mostlry NPO and TFs off, will do sips of clears today an advance diet if she tolerates, continue dressing changes, appreciate WOC consult  LOS: 10 days    Betty [name] 02/18/2013

## 2013-02-18 NOTE — Progress Notes (Signed)
Doing well today. Burping and passing gas. No n/v. Wants applesauce.  Apparently HR dropped to around 44 when ambulating now. Was asymptomatic  Alert, nad Soft, nd, nt. g tube ok  Ok with non-carbonated liquids Pt may have applesauce Will restart trophic TF - check residual per protocol Isolated bradycardia - check lytes in am. If recurs - ?pacemaker problem  Mary Sella. Andrey Campanile, MD, FACS General, Bariatric, & Minimally Invasive Surgery [facility], [place]

## 2013-02-18 NOTE — Progress Notes (Signed)
NUTRITION FOLLOW UP  Intervention:   1.  Enteral nutrition; Per [facility] discretion. Once gut motility vs. reflux better understood, recommend resuming trial of trophic feeds @ 10 mL/hr continuous 2.  Modify diet; resume as ileus improves.   Recommend no carbonated beverages or orange juice as first foods.  3.  Calorie count; continue to hold calorie count as pt is trialing clear liquids at this time.  4.  Parenteral nutrition; TPN per MD/PharmD.  Question whether trickle EN could be re-attempted in the next 12-24 hrs prior to starting TPN.   Nutrition Dx:   Inadequate oral intake now r/t to post-op ileus.  Ongoing.   Goal:   Initiate nutrition support within next 24-48 hours if prolonged NPO expected. Met, Trickle feeds ongoing.   Monitor:   Nutrition support initiation, respiratory status, weight, labs, I/O's  Assessment:   Pt with decreased tolerance of diet yesterday.   TFs currently on hold.  Pt planning to trial clear liquids today. +BM. G-tube to suction.  Recommended that once diet is resumed we eliminate carbonated beverages and acidic juice (orange juice) for better possible tolerance.  Pt found to meet criteria for severe malnutrition in the context of chronic illness given severe muscle loss (temples, clavicles, shoulders) and severe subcutaneous fat loss (biceps/triceps, below the eyes).  Height: Ht Readings from Last 1 Encounters:  02/13/13 5' (1.524 m)    Weight Status:   Wt Readings from Last 1 Encounters:  02/18/13 119 lb 7.8 oz (54.2 kg)  Wt 91 lbs on admission.   Re-estimated needs:  Kcal: 1400-1600 Protein: 65-75g Fluid: >1.5 L/day  Skin: incisions  Diet Order:     Intake/Output Summary (Last 24 hours) at 02/18/13 1009 Last data filed at 02/18/13 0912  Gross per 24 hour  Intake   2230 ml  Output    850 ml  Net   1380 ml    Last BM: 6/20   Labs:   Recent Labs Lab 02/12/13 0425  02/15/13 0500 02/16/13 0500 02/17/13 0530  NA 139  < > 146*  139 141  K 3.1*  < > 3.3* 4.5 4.6  CL 110  < > 116* 113* 112  CO2 21  < > 24 23 23   BUN 15  < > 9 8 10   CREATININE 0.82  < > 0.82 0.67 0.62  CALCIUM 6.9*  < > 7.0* 7.6* 8.0*  MG 1.5  --  1.4* 1.8  --   PHOS 3.0  --   --   --   --   GLUCOSE 75  < > 93 110* 88  < > = values in this interval not displayed.  CBG (last 3)   Recent Labs  02/18/13 0010 02/18/13 0447 02/18/13 0757  GLUCAP 123* 80 93    Scheduled Meds: . antiseptic oral rinse  15 mL Mouth Rinse QID  . chlorhexidine  15 mL Mouth Rinse BID  . enoxaparin (LOVENOX) injection  20 mg Subcutaneous Q24H  . feeding supplement (VITAL AF 1.2 CAL)  1,000 mL Per Tube Q24H  . insulin aspart  0-15 Units Subcutaneous Q4H  . pantoprazole  20 mg Oral Daily  . sodium chloride  10-40 mL Intracatheter Q12H    Continuous Infusions: . dextrose 5 % and 0.9% NaCl 100 mL/hr at 02/17/13 2104    Loyce Dys, MS RD LDN Clinical Inpatient Dietitian Pager: [phone] Weekend/After hours pager: [phone]

## 2013-02-19 LAB — BASIC METABOLIC PANEL
CO2: 22 mEq/L (ref 19–32)
Chloride: 114 mEq/L — ABNORMAL HIGH (ref 96–112)
GFR calc Af Amer: 87 mL/min — ABNORMAL LOW (ref >90)
Potassium: 3.8 mEq/L (ref 3.5–5.1)
Sodium: 141 mEq/L (ref 135–145)

## 2013-02-19 LAB — MAGNESIUM: Magnesium: 1.5 mg/dL (ref 1.5–2.5)

## 2013-02-19 LAB — GLUCOSE, CAPILLARY
Glucose-Capillary: 98 mg/dL (ref 70–99)
Glucose-Capillary: 104 mg/dL — ABNORMAL HIGH (ref 70–99)

## 2013-02-19 MED ORDER — BIOTENE DRY MOUTH MT LIQD
15.0000 mL | Freq: Two times a day (BID) | OROMUCOSAL | Status: DC
  Administered 2013-02-19 – 2013-03-01 (×17): 15 mL via OROMUCOSAL

## 2013-02-19 NOTE — Progress Notes (Signed)
Patient ID: Betty [name], female   DOB: 12-30-1914, 79 y.o.   MRN: [id number] 9 Days Post-Op  Subjective: Feels much better today after she had a large bowel movement this morning. Denies nausea or abdominal pain. Would like to try more solid food. Tolerating liquids without difficulty.  Objective: Vital signs in last 24 hours: Temp:  [98 F (36.7 C)-98.9 F (37.2 C)] 98 F (36.7 C) (06/21 0800) Pulse Rate:  [47-78] 77 (06/21 0800) Resp:  [12-24] 24 (06/21 1000) BP: (93-128)/(47-87) 110/64 mmHg (06/21 1000) SpO2:  [99 %-100 %] 99 % (06/21 0800) Last BM Date: 02/17/13  Intake/Output from previous day: 06/20 0701 - 06/21 0700 In: 2530 [I.V.:2430; NG/GT:100] Out: 725 [Urine:700; Emesis/NG output:25] Intake/Output this shift: Total I/O In: 300 [I.V.:300] Out: -   General appearance: alert, cooperative and no distress GI: normal findings: soft, non-tender and and nondistended Incision/Wound: mostly clean granulation with a few small areas of eschar  Lab Results:  No results found for this basename: WBC, HGB, HCT, PLT,  in the last 72 hours BMET  Recent Labs  02/17/13 0530 02/19/13 0500  NA 141 141  K 4.6 3.8  CL 112 114*  CO2 23 22  GLUCOSE 88 106*  BUN 10 10  CREATININE 0.62 0.60  CALCIUM 8.0* 7.1*     Studies/Results: No results found.  Anti-infectives: Anti-infectives   Start     Dose/Rate Route Frequency Ordered Stop   02/11/13 1200  piperacillin-tazobactam (ZOSYN) IVPB 3.375 g  Status:  Discontinued     3.375 g 12.5 mL/hr over 240 Minutes Intravenous 3 times per day 02/11/13 1002 02/15/13 0752   02/09/13 0600  cefOXitin (MEFOXIN) 2 g in dextrose 5 % 50 mL IVPB     2 g 100 mL/hr over 30 Minutes Intravenous On call to O.R. 02/08/13 2311 02/08/13 2353   02/09/13 0600  cefOXitin (MEFOXIN) 1 g in dextrose 5 % 50 mL IVPB  Status:  Discontinued     1 g 100 mL/hr over 30 Minutes Intravenous 3 times per day 02/09/13 0337 02/11/13 0953      Assessment/Plan: s/p  Procedure(s): EXPLORATORY LAPAROTOMY, CLOSURE OF ABDOMEN Doing better today with apparent improvement of postoperative ileus. Advanced to soft diet.   LOS: 11 days    Merrillyn Ackerley T 02/19/2013

## 2013-02-20 LAB — GLUCOSE, CAPILLARY
Glucose-Capillary: 98 mg/dL (ref 70–99)
Glucose-Capillary: 94 mg/dL (ref 70–99)
Glucose-Capillary: 89 mg/dL (ref 70–99)

## 2013-02-20 NOTE — Progress Notes (Signed)
Betty [name], [name] [phone] 02/20/2013

## 2013-02-20 NOTE — Progress Notes (Signed)
Patient ID: Betty [name], female   DOB: 05-01-1915, 79 y.o.   MRN: [id number] 10 Days Post-Op  Subjective: Has had 2 more normal bowel movements. Denies any pain. She is tolerating a small amount of food that feels she has no appetite. No nausea or vomiting.  Objective: Vital signs in last 24 hours: Temp:  [97.2 F (36.2 C)-98.6 F (37 C)] 97.2 F (36.2 C) (06/22 0827) Pulse Rate:  [67-77] 67 (06/22 1000) Resp:  [16-23] 16 (06/22 1000) BP: (104-137)/(49-60) 105/50 mmHg (06/22 1000) SpO2:  [99 %-100 %] 100 % (06/22 1000) Weight:  [121 lb 4.1 oz (55 kg)] 121 lb 4.1 oz (55 kg) (06/22 0357) Last BM Date: 02/20/13  Intake/Output from previous day: 06/21 0701 - 06/22 0700 In: 2660 [P.O.:240; I.V.:2300; NG/GT:120] Out: 125 [Urine:125] Intake/Output this shift: Total I/O In: 510 [I.V.:500; NG/GT:10] Out: -   General appearance: alert, cooperative and no distress GI: normal findings: soft, non-tender Incision/Wound: dressing dry and intact. Did not reexamine wound today.  Lab Results:  No results found for this basename: WBC, HGB, HCT, PLT,  in the last 72 hours BMET  Recent Labs  02/19/13 0500  NA 141  K 3.8  CL 114*  CO2 22  GLUCOSE 106*  BUN 10  CREATININE 0.60  CALCIUM 7.1*     Studies/Results: No results found.  Anti-infectives: Anti-infectives   Start     Dose/Rate Route Frequency Ordered Stop   02/11/13 1200  piperacillin-tazobactam (ZOSYN) IVPB 3.375 g  Status:  Discontinued     3.375 g 12.5 mL/hr over 240 Minutes Intravenous 3 times per day 02/11/13 1002 02/15/13 0752   02/09/13 0600  cefOXitin (MEFOXIN) 2 g in dextrose 5 % 50 mL IVPB     2 g 100 mL/hr over 30 Minutes Intravenous On call to O.R. 02/08/13 2311 02/08/13 2353   02/09/13 0600  cefOXitin (MEFOXIN) 1 g in dextrose 5 % 50 mL IVPB  Status:  Discontinued     1 g 100 mL/hr over 30 Minutes Intravenous 3 times per day 02/09/13 0337 02/11/13 0953      Assessment/Plan: s/p Procedure(s): EXPLORATORY  LAPAROTOMY, CLOSURE OF ABDOMEN Overall doing well with return of bowel function but very poor appetite. I encouraged her to try the little bit more today and we will monitor by mouth intake. She has a gastrostomy tube in place if nutritional supplementation as necessary.   LOS: 12 days    Takeshi Teasdale T 02/20/2013

## 2013-02-21 LAB — GLUCOSE, CAPILLARY
Glucose-Capillary: 93 mg/dL (ref 70–99)
Glucose-Capillary: 96 mg/dL (ref 70–99)
Glucose-Capillary: 104 mg/dL — ABNORMAL HIGH (ref 70–99)

## 2013-02-21 MED ORDER — OSMOLITE 1.2 CAL PO LIQD
1000.0000 mL | ORAL | Status: DC
  Administered 2013-02-21 – 2013-03-01 (×5): 1000 mL
  Filled 2013-02-21 (×10): qty 1000

## 2013-02-21 MED ORDER — ENSURE COMPLETE PO LIQD
237.0000 mL | Freq: Three times a day (TID) | ORAL | Status: DC
  Administered 2013-02-21 – 2013-03-01 (×22): 237 mL via ORAL

## 2013-02-21 NOTE — Progress Notes (Signed)
[facility]/Betty [name] Progress Note 11 Days Post-Op  Subjective: Patient is very conversant and appropriate.  Not eating very much  Objective: Vital signs in last 24 hours: Temp:  [97.9 F (36.6 C)-98.6 F (37 C)] 98.3 F (36.8 C) (06/23 0804) Pulse Rate:  [67-80] 69 (06/23 0804) Resp:  [16-25] 25 (06/23 0804) BP: (102-142)/(50-83) 121/70 mmHg (06/23 0804) SpO2:  [98 %-100 %] 98 % (06/23 0804) Weight:  [60 kg (132 lb 4.4 oz)] 60 kg (132 lb 4.4 oz) (06/23 0400) Last BM Date: 02/20/13  Intake/Output from previous day: 06/22 0701 - 06/23 0700 In: 2580 [P.O.:60; I.V.:2400; NG/GT:120] Out: 100 [Emesis/NG output:100] Intake/Output this shift:    General: No distress.  Daughter with the patient   Lungs: Clear  Abd: Soft, nontender, good bowel sounds.  Eating.  Extremities: No change  Neuro: Intact   BMET  Recent Labs  02/19/13 0500  NA 141  K 3.8  CL 114*  CO2 22  GLUCOSE 106*  BUN 10  CREATININE 0.60  CALCIUM 7.1*   PT/INR No results found for this basename: LABPROT, INR,  in the last 72 hours ABG No results found for this basename: PHART, PCO2, PO2, HCO3,  in the last 72 hours  Studies/Results: No results found.  Anti-infectives: Anti-infectives   Start     Dose/Rate Route Frequency Ordered Stop   02/11/13 1200  piperacillin-tazobactam (ZOSYN) IVPB 3.375 g  Status:  Discontinued     3.375 g 12.5 mL/hr over 240 Minutes Intravenous 3 times per day 02/11/13 1002 02/15/13 0752   02/09/13 0600  cefOXitin (MEFOXIN) 2 g in dextrose 5 % 50 mL IVPB     2 g 100 mL/hr over 30 Minutes Intravenous On call to O.R. 02/08/13 2311 02/08/13 2353   02/09/13 0600  cefOXitin (MEFOXIN) 1 g in dextrose 5 % 50 mL IVPB  Status:  Discontinued     1 g 100 mL/hr over 30 Minutes Intravenous 3 times per day 02/09/13 0337 02/11/13 0953      Assessment/Plan: s/p Procedure(s): EXPLORATORY LAPAROTOMY, CLOSURE OF ABDOMEN Advance diet Plan for discharge tomorrow Transfer to the floor  today.  LOS: 13 days   Betty [name]. Gae Bon, MD, FACS [phone] [facility] 02/21/2013

## 2013-02-21 NOTE — Progress Notes (Signed)
Occupational Therapy Treatment Patient Details Name: Betty [name] MRN: [id number] DOB: Apr 16, 1915 Today's Date: 02/21/2013 Time: 4098-1191 OT Time Calculation (min): 36 min  OT Assessment / Plan / Recommendation Comments on Treatment Session This 79 yo female admiited with abd pain, found to have an incarcerated R obturator hernia.Underwent exp lap, hernia repair. Pt with acute encephalopathy, ARF presents to acute OT making slow progress. Will benefit from [facility]    Follow Up Recommendations  Home health OT;Supervision/Assistance - 24 hour ([facility])       Equipment Recommendations  3 in 1 bedside comode       Frequency Min 2X/week   Plan Discharge plan remains appropriate    Precautions / Restrictions Precautions Precautions: Fall Precaution Comments: No abdominal binder on her this PM Required Braces or Orthoses: Other Brace/Splint Other Brace/Splint: abdominal binder Restrictions Weight Bearing Restrictions: No   Pertinent Vitals/Pain Right lower quadrant pain, pt stated she could not breath well (sats 97% on RA)    ADL  Toilet Transfer: Simulated;Minimal assistance (with increased time) Toilet Transfer Method: Sit to stand Toilet Transfer Equipment:  (Bed>to end of bed to sit in recliner (could not make it furt) Equipment Used: Gait belt;Rolling walker Transfers/Ambulation Related to ADLs: Min A for all ADL Comments: Daughter in room and I explained to her how to help her Mom out of bed by bending up her knees, rolling onto her side, and then coming up to sit. Daughter also saw that pt also only went 5 feet before she could not go any further. Daughter states that she can take care of her at this level and still wants to take her home.      OT Goals ADL Goals Pt Will Perform Lower Body Bathing: with supervision;with set-up;with caregiver independent in assisting;Sit to stand from chair Pt Will Perform Lower Body Dressing: with set-up;with supervision;with caregiver  independent in assisting;Sit to stand from chair Pt Will Transfer to Toilet: with supervision;with DME;Ambulation;3-in-1 ADL Goal: Toilet Transfer - Progress: Progressing toward goals Pt Will Perform Toileting - Clothing Manipulation: with supervision;Sitting on 3-in-1 or toilet;Standing Pt Will Perform Toileting - Hygiene: with supervision;Sit to stand from 3-in-1/toilet Additional ADL Goal #1: Family will verbalize importance of providing 24/7 supervision and plan for preventing falls. ADL Goal: Additional Goal #1 - Progress: Met  Visit Information  Last OT Received On: 02/21/13 Assistance Needed: +1    Subjective Data  Subjective: "I just don't feel like I can catch my breath as well when I am standing up      Cognition  Cognition Arousal/Alertness: Awake/alert Behavior During Therapy: WFL for tasks assessed/performed Overall Cognitive Status: Within Functional Limits for tasks assessed    Mobility  Bed Mobility Bed Mobility: Rolling Right;Right Sidelying to Sit;Sitting - Scoot to Edge of Bed Rolling Right: 3: Mod assist Right Sidelying to Sit: 3: Mod assist Supine to Sit: 2: Max assist Sitting - Scoot to Edge of Bed: 4: Min assist Details for Bed Mobility Assistance: Pt required increased assist today with transitional movements.  Encouragement to perform as independently as possible but pt very quick to reach for therapist for assistance & to pull self to sitting upright.   Transfers Transfers: Sit to Stand;Stand to Sit Sit to Stand: 4: Min assist;With upper extremity assist;From bed Stand to Sit: 4: Min assist;With upper extremity assist;With armrests;To chair/3-in-1 Details for Transfer Assistance: VCs for safe hand placement          End of Session OT - End of Session  Equipment Utilized During Treatment: Gait belt Activity Tolerance: Patient limited by fatigue Patient left: in chair;with call bell/phone within reach;with family/visitor present       Evette Georges [phone] 02/21/2013, 3:59 PM

## 2013-02-21 NOTE — Progress Notes (Signed)
Report called to RN on unit  6N. Patient to be transferred via bed 6N RM 12. Daughter at bedside and made aware of transfer.

## 2013-02-21 NOTE — Progress Notes (Addendum)
NUTRITION FOLLOW UP  Intervention:   1.  Enteral nutrition; Consider resuming cyclic nocturnal feeds to support intake and healing as appetite and PO intake improves.  Recommend Osmolite 1.2 @ 35 mL/hr overnight to provide 504 kcal, 23g protein, and 344 mL free water. In time, pt may be able to wean completely from TFs.  Discussed with MD.  Will initiate Osmolite 1.2 @ 35 ml/hr x12 hrs overnight.  2.  General healthful diet; encourage intake of meals and supplements.  MD please include Ensure (or nutrition supplement of choice) BID on home medication list.   Nutrition Dx:   Inadequate oral intake now r/t to post-op ileus.  Ongoing.   Goal:   Initiate nutrition support if TFs at discharge appropriate Improvement in PO intake.   Monitor:   Nutrition support initiation, respiratory status, weight, labs, I/O's  Assessment:   Pt has been able to advance diet to Regular with tolerance.  Her appetite and intake are currently poor.   Per CCS MD, G-tube in place if nutrition supplementation is necessary. Question whether pt would benefit from continued G-tube supplementation to PO intake during healing process with eventual wean once intake, wt, and wounds are stable.   Discussed nutrition care plan with daughter and pt who are in total agreement to use of G-tube overnight.  Pt c/o altered taste and poor appetite. Encouraged intake PO and discussed goal of PO intake meeting 100% of estimated needs in time.   Pt found to meet criteria for severe malnutrition in the context of chronic illness given severe muscle loss (temples, clavicles, shoulders) and severe subcutaneous fat loss (biceps/triceps, below the eyes).  Height: Ht Readings from Last 1 Encounters:  02/13/13 5' (1.524 m)    Weight Status:   Wt Readings from Last 1 Encounters:  02/21/13 132 lb 4.4 oz (60 kg)  Wt 91 lbs on admission.   Re-estimated needs:  Kcal: 1400-1600 Protein: 65-75g Fluid: >1.5 L/day  Skin:  incisions  Diet Order: General   Intake/Output Summary (Last 24 hours) at 02/21/13 0954 Last data filed at 02/21/13 0600  Gross per 24 hour  Intake 2328.33 ml  Output    100 ml  Net 2228.33 ml    Last BM: 6/22   Labs:   Recent Labs Lab 02/15/13 0500 02/16/13 0500 02/17/13 0530 02/19/13 0500  NA 146* 139 141 141  K 3.3* 4.5 4.6 3.8  CL 116* 113* 112 114*  CO2 24 23 23 22   BUN 9 8 10 10   CREATININE 0.82 0.67 0.62 0.60  CALCIUM 7.0* 7.6* 8.0* 7.1*  MG 1.4* 1.8  --  1.5  GLUCOSE 93 110* 88 106*    CBG (last 3)   Recent Labs  02/20/13 2050 02/21/13 0108 02/21/13 0805  GLUCAP 95 97 93    Scheduled Meds: . antiseptic oral rinse  15 mL Mouth Rinse BID  . enoxaparin (LOVENOX) injection  20 mg Subcutaneous Q24H  . feeding supplement  237 mL Oral TID WC  . insulin aspart  0-15 Units Subcutaneous Q4H  . sodium chloride  10-40 mL Intracatheter Q12H    Continuous Infusions: . dextrose 5 % and 0.9% NaCl 100 mL/hr at 02/21/13 0521    Loyce Dys, MS RD LDN Clinical Inpatient Dietitian Pager: [phone] Weekend/After hours pager: [phone]

## 2013-02-21 NOTE — Progress Notes (Signed)
Physical Therapy Treatment Patient Details Name: Betty [name] MRN: [id number] DOB: 1915-08-19 Today's Date: 02/21/2013 Time: 4098-1191 PT Time Calculation (min): 38 min  PT Assessment / Plan / Recommendation Comments on Treatment Session  Pt appeared much more fatigued + weak today.  Required increased assistance for bed mobility, transfers, & only able to ambulate ~5' today.  Due to decline in mobility in today's session, pt would best benefit from ST-SNF to maximize functional recovery, strength, & independence with mobility.  d/c plans updated.  Spoke with case manager regarding d/c recommendation at this date & shes states she will speak with pt's daughter.      Follow Up Recommendations  SNF;Home health PT;Supervision/Assistance - 24 hour     Does the patient have the potential to tolerate intense rehabilitation     Barriers to Discharge        Equipment Recommendations  Rolling walker with 5" wheels    Recommendations for Other Services    Frequency Min 3X/week   Plan Discharge plan needs to be updated;Frequency remains appropriate    Precautions / Restrictions Precautions Precautions: Fall Precaution Comments: abdominal binder Required Braces or Orthoses: Other Brace/Splint Other Brace/Splint: abdominal binder Restrictions Weight Bearing Restrictions: No       Mobility  Bed Mobility Bed Mobility: Rolling Right;Right Sidelying to Sit;Sitting - Scoot to Edge of Bed Rolling Right: 3: Mod assist;With rail Right Sidelying to Sit: 3: Mod assist;HOB flat;With rails Supine to Sit: 2: Max assist Sitting - Scoot to Edge of Bed: 3: Mod assist Details for Bed Mobility Assistance: Pt required increased assist today with transitional movements.  Encouragement to perform as independently as possible but pt very quick to reach for therapist for assistance & to pull self to sitting upright.   Transfers Transfers: Sit to Stand;Stand to [employer] Transfers Sit to Stand: 3: Mod  assist;With upper extremity assist;With armrests;From bed;From chair/3-in-1 Stand to Sit: 4: Min assist;With upper extremity assist;With armrests;To bed;To chair/3-in-1 Stand Pivot Transfers: 4: Min assist Details for Transfer Assistance: Cues for hand placement & technique.  Increased assist to achieve standing today compared to last PT session. (A) to achieve standing, translate balance forwards over BOS, & controlled descent.   Ambulation/Gait Ambulation/Gait Assistance: 3: Mod assist Ambulation Distance (Feet): 5 Feet Assistive device: Rolling walker Ambulation/Gait Assistance Details: Pt limited by fatigue + weakness today.  ambulates with flexed posture & shuffling steps.   Gait Pattern: Trunk flexed;Shuffle Gait velocity: decreased General Gait Details: Pt appeared very weak today & unable to tolerate activity well.   Stairs: No Wheelchair Mobility Wheelchair Mobility: No     PT Goals Acute Rehab PT Goals Time For Goal Achievement: 03/01/13 Potential to Achieve Goals: Good Pt will go Supine/Side to Sit: with modified independence;with HOB 0 degrees PT Goal: Supine/Side to Sit - Progress: Not progressing Pt will go Sit to Stand: with supervision;with upper extremity assist PT Goal: Sit to Stand - Progress: Not progressing Pt will Transfer Bed to Chair/Chair to Bed: with min assist Pt will Ambulate: 51 - 150 feet;with supervision;with least restrictive assistive device PT Goal: Ambulate - Progress: Not progressing Pt will Go Up / Down Stairs: 6-9 stairs;with min assist;with least restrictive assistive device  Visit Information  Last PT Received On: 02/21/13 Assistance Needed: +1    Subjective Data      Cognition  Cognition Arousal/Alertness: Awake/alert Behavior During Therapy: [facility] for tasks assessed/performed    Balance     End of Session PT - End of Session  Equipment Utilized During Treatment: Gait belt Activity Tolerance: Patient limited by fatigue Patient left:  in bed;with call bell/phone within reach;with family/visitor present Nurse Communication: Mobility status    Verdell Face, [name] [phone] 02/21/2013

## 2013-02-22 ENCOUNTER — Inpatient Hospital Stay (HOSPITAL_COMMUNITY): Payer: Medicare Other

## 2013-02-22 DIAGNOSIS — R609 Edema, unspecified: Secondary | ICD-10-CM

## 2013-02-22 DIAGNOSIS — R0602 Shortness of breath: Secondary | ICD-10-CM

## 2013-02-22 LAB — COMPREHENSIVE METABOLIC PANEL
ALT: 15 U/L (ref 0–35)
Alkaline Phosphatase: 82 U/L (ref 39–117)
BUN: 7 mg/dL (ref 6–23)
CO2: 24 mEq/L (ref 19–32)
Calcium: 7.5 mg/dL — ABNORMAL LOW (ref 8.4–10.5)
GFR calc Af Amer: 87 mL/min — ABNORMAL LOW (ref >90)
GFR calc non Af Amer: 75 mL/min — ABNORMAL LOW (ref >90)
Glucose, Bld: 90 mg/dL (ref 70–99)
Sodium: 144 mEq/L (ref 135–145)

## 2013-02-22 LAB — URINALYSIS, ROUTINE W REFLEX MICROSCOPIC
Glucose, UA: NEGATIVE mg/dL
Ketones, ur: NEGATIVE mg/dL
Leukocytes, UA: NEGATIVE
Nitrite: NEGATIVE
Protein, ur: NEGATIVE mg/dL
Urobilinogen, UA: 0.2 mg/dL (ref 0.0–1.0)

## 2013-02-22 LAB — CBC
HCT: 27.4 % — ABNORMAL LOW (ref 36.0–46.0)
Hemoglobin: 9.3 g/dL — ABNORMAL LOW (ref 12.0–15.0)
MCH: 27.9 pg (ref 26.0–34.0)
MCHC: 33.9 g/dL (ref 30.0–36.0)
RBC: 3.33 MIL/uL — ABNORMAL LOW (ref 3.87–5.11)

## 2013-02-22 LAB — URINE MICROSCOPIC-ADD ON

## 2013-02-22 LAB — GLUCOSE, CAPILLARY
Glucose-Capillary: 80 mg/dL (ref 70–99)
Glucose-Capillary: 89 mg/dL (ref 70–99)

## 2013-02-22 MED ORDER — FUROSEMIDE 10 MG/ML IJ SOLN
20.0000 mg | Freq: Once | INTRAMUSCULAR | Status: AC
  Administered 2013-02-22: 20 mg via INTRAVENOUS
  Filled 2013-02-22: qty 2

## 2013-02-22 MED ORDER — POTASSIUM CHLORIDE 20 MEQ/15ML (10%) PO LIQD
40.0000 meq | Freq: Once | ORAL | Status: AC
  Administered 2013-02-22: 40 meq via ORAL
  Filled 2013-02-22: qty 30

## 2013-02-22 NOTE — Progress Notes (Signed)
Not sure why she has developed so much pitting edema of her lower extremities, but the current work-up is good.  Will follow up.  Does not appear to be in failure.    Marta Lamas. Gae Bon, MD, FACS [phone] [facility] Surgery

## 2013-02-22 NOTE — Progress Notes (Signed)
12 Days Post-Op  Subjective: Pt short of breath, improves when she uses IS.  Denies chest pains or palpitations. Objective: Vital signs in last 24 hours: Temp:  [97.7 F (36.5 C)-98.3 F (36.8 C)] 97.7 F (36.5 C) (06/24 0532) Pulse Rate:  [80-81] 80 (06/24 0532) Resp:  [16-20] 20 (06/24 0532) BP: (123-153)/(63-81) 131/63 mmHg (06/24 0532) SpO2:  [97 %-100 %] 98 % (06/24 0532) Weight:  [134 lb 14.7 oz (61.2 kg)] 134 lb 14.7 oz (61.2 kg) (06/24 0500) Last BM Date: 02/20/13  Intake/Output from previous day: 06/23 0701 - 06/24 0700 In: 1121 [P.O.:390; I.V.:340] Out: 250 [Urine:250] Intake/Output this shift:    General appearance: alert, cooperative, appears stated age and no distress Resp: diminished bilaterally. Cardio: regular rate and rhythm, S1, S2 normal, 2/6 SEM, no click, rub or gallop.  No cyanosis. GI: soft, non-tender; bowel sounds normal; no masses,  no organomegaly.  Midline incision-small area or necrosis superior aspect of the wound otherwise clean with adequate granulation tissue. Pulses: 2+ and symmetric Skin: Skin color, texture, turgor normal. No rashes or lesions.  +3 pitting edema to BLE.  Anti-infectives: Anti-infectives   Start     Dose/Rate Route Frequency Ordered Stop   02/11/13 1200  piperacillin-tazobactam (ZOSYN) IVPB 3.375 g  Status:  Discontinued     3.375 g 12.5 mL/hr over 240 Minutes Intravenous 3 times per day 02/11/13 1002 02/15/13 0752   02/09/13 0600  cefOXitin (MEFOXIN) 2 g in dextrose 5 % 50 mL IVPB     2 g 100 mL/hr over 30 Minutes Intravenous On call to O.R. 02/08/13 2311 02/08/13 2353   02/09/13 0600  cefOXitin (MEFOXIN) 1 g in dextrose 5 % 50 mL IVPB  Status:  Discontinued     1 g 100 mL/hr over 30 Minutes Intravenous 3 times per day 02/09/13 0337 02/11/13 0953      Assessment/Plan: Exploratory laparotomy. -night time feeds.  Her daughter will need teaching.  She will also need home health for dressing changes.  Peripheral edema  and shortness  -obtain a stat chest x ray and venous dopplers.  Obtain CBC to ensure she is not anemic.  Obtain CMP to rule out renal or hepatic etiology. -give 20 of lasix. -She may need an echocardiogram, but will wait until discussing with attending. Will start with BNP.   LOS: 14 days    Bonner Puna [facility] ANP-BC Pager [phone]  02/22/2013 10:44 AM

## 2013-02-22 NOTE — Progress Notes (Signed)
NUTRITION FOLLOW UP  Intervention:   1.  Enteral nutrition; Continue with Osmolite 1.2 @ 35 mL/hr x 12 hrs to supplement PO intake.   2.  General healthful diet; encourage intake of meals and supplements.  MD please include Ensure (or nutrition supplement of choice) BID on home medication list.   Nutrition Dx:   Inadequate oral intake now r/t to post-op ileus.  Ongoing.   Goal:   Initiate nutrition support if TFs at discharge appropriate Improvement in PO intake.   Monitor:   Nutrition support initiation, respiratory status, weight, labs, I/O's  Assessment:   Pt has been able to advance diet to Regular with tolerance.  Her appetite and intake are currently poor, but seem to be improving.  Pt tolerated Osmolite 1.2 @ 35 ml/hr overnight.  Pt and daughter with questions about product with are answered.   Discussed nutrition care plan with daughter and pt.  Pt continues to c/o altered taste and poor appetite. Encouraged intake PO and discussed goal of PO intake meeting 100% of estimated needs in time.   Pt found to meet criteria for severe malnutrition in the context of chronic illness given severe muscle loss (temples, clavicles, shoulders) and severe subcutaneous fat loss (biceps/triceps, below the eyes).  Height: Ht Readings from Last 1 Encounters:  02/13/13 5' (1.524 m)    Weight Status:   Wt Readings from Last 1 Encounters:  02/22/13 134 lb 14.7 oz (61.2 kg)  Wt 91 lbs on admission.   Re-estimated needs:  Kcal: 1400-1600 Protein: 65-75g Fluid: >1.5 L/day  Skin: incisions, pt with peripheral edema  Diet Order: General   Intake/Output Summary (Last 24 hours) at 02/22/13 1230 Last data filed at 02/22/13 0830  Gross per 24 hour  Intake   1361 ml  Output    250 ml  Net   1111 ml    Last BM: 6/22   Labs:   Recent Labs Lab 02/16/13 0500 02/17/13 0530 02/19/13 0500  NA 139 141 141  K 4.5 4.6 3.8  CL 113* 112 114*  CO2 23 23 22   BUN 8 10 10   CREATININE  0.67 0.62 0.60  CALCIUM 7.6* 8.0* 7.1*  MG 1.8  --  1.5  GLUCOSE 110* 88 106*    CBG (last 3)   Recent Labs  02/22/13 0339 02/22/13 0813 02/22/13 1204  GLUCAP 133* 80 89    Scheduled Meds: . antiseptic oral rinse  15 mL Mouth Rinse BID  . enoxaparin (LOVENOX) injection  20 mg Subcutaneous Q24H  . feeding supplement  237 mL Oral TID WC  . insulin aspart  0-15 Units Subcutaneous Q4H  . sodium chloride  10-40 mL Intracatheter Q12H    Continuous Infusions: . dextrose 5 % and 0.9% NaCl 20 mL/hr (02/22/13 0411)  . feeding supplement (OSMOLITE 1.2 CAL) 1,000 mL (02/21/13 1844)    Loyce Dys, MS RD LDN Clinical Inpatient Dietitian Pager: [phone] Weekend/After hours pager: [phone]

## 2013-02-22 NOTE — Progress Notes (Signed)
Physical Therapy Treatment Patient Details Name: Betty [name] MRN: [id number] DOB: [birth year] Today's Date: 02/22/2013 Time: 4696-2952 PT Time Calculation (min): 5 min                      Visit Information  Last PT Received On: 02/22/13 Reason Eval/Treat Not Completed: Medical issues which prohibited therapy. Pt with pitting edema and work up still incomplete to rule out causes. Spoke with RN who agreed with holding today and follow up tomorrow.                 GP     Sallyanne Kuster 02/22/2013, 3:56 PM

## 2013-02-23 DIAGNOSIS — E8779 Other fluid overload: Secondary | ICD-10-CM

## 2013-02-23 DIAGNOSIS — Z950 Presence of cardiac pacemaker: Secondary | ICD-10-CM

## 2013-02-23 DIAGNOSIS — M79609 Pain in unspecified limb: Secondary | ICD-10-CM

## 2013-02-23 DIAGNOSIS — M7989 Other specified soft tissue disorders: Secondary | ICD-10-CM

## 2013-02-23 DIAGNOSIS — I5031 Acute diastolic (congestive) heart failure: Secondary | ICD-10-CM | POA: Diagnosis not present

## 2013-02-23 DIAGNOSIS — I509 Heart failure, unspecified: Secondary | ICD-10-CM

## 2013-02-23 DIAGNOSIS — E8770 Fluid overload, unspecified: Secondary | ICD-10-CM | POA: Diagnosis not present

## 2013-02-23 DIAGNOSIS — R0602 Shortness of breath: Secondary | ICD-10-CM

## 2013-02-23 LAB — BASIC METABOLIC PANEL
BUN: 7 mg/dL (ref 6–23)
GFR calc Af Amer: 86 mL/min — ABNORMAL LOW (ref >90)
GFR calc non Af Amer: 74 mL/min — ABNORMAL LOW (ref >90)
Potassium: 3.7 mEq/L (ref 3.5–5.1)

## 2013-02-23 LAB — GLUCOSE, CAPILLARY
Glucose-Capillary: 78 mg/dL (ref 70–99)
Glucose-Capillary: 96 mg/dL (ref 70–99)

## 2013-02-23 LAB — CK TOTAL AND CKMB (NOT AT ARMC)
CK, MB: 14.4 ng/mL (ref 0.3–4.0)
Total CK: 182 U/L — ABNORMAL HIGH (ref 7–177)

## 2013-02-23 LAB — CBC
HCT: 26.4 % — ABNORMAL LOW (ref 36.0–46.0)
MCHC: 33.7 g/dL (ref 30.0–36.0)
Platelets: 275 10*3/uL (ref 150–400)
RDW: 16.1 % — ABNORMAL HIGH (ref 11.5–15.5)

## 2013-02-23 LAB — PRO B NATRIURETIC PEPTIDE: Pro B Natriuretic peptide (BNP): 2146 pg/mL — ABNORMAL HIGH (ref 0–450)

## 2013-02-23 MED ORDER — SODIUM BICARBONATE 650 MG PO TABS
650.0000 mg | ORAL_TABLET | Freq: Once | ORAL | Status: AC
  Administered 2013-02-23: 650 mg via ORAL
  Filled 2013-02-23: qty 1

## 2013-02-23 MED ORDER — ACETAMINOPHEN 325 MG PO TABS: 325.0000 mg | ORAL_TABLET | Freq: Four times a day (QID) | ORAL | Status: DC | PRN

## 2013-02-23 MED ORDER — FUROSEMIDE 10 MG/ML IJ SOLN
20.0000 mg | Freq: Once | INTRAMUSCULAR | Status: AC
  Administered 2013-02-23: 20 mg via INTRAVENOUS
  Filled 2013-02-23: qty 2

## 2013-02-23 MED ORDER — PANCRELIPASE (LIP-PROT-AMYL) 12000-38000 UNITS PO CPEP
2.0000 | ORAL_CAPSULE | Freq: Once | ORAL | Status: AC
  Administered 2013-02-23: 2 via ORAL
  Filled 2013-02-23: qty 2

## 2013-02-23 NOTE — Consult Note (Addendum)
CARDIOLOGY CONSULT NOTE    Patient ID: Betty Betty [name] MRN: [id number] DOB/AGE: [birth year] 79 y.o.  Admit date: 02/08/2013 Referring Physician Avel Peace MD Primary Physician N/A Primary Cardiologist Sharrell Ku MD Reason for Consultation CHF  HPI: Betty Betty [name] is seen at the request of Dr. Abbey Chatters for evaluation of congestive heart failure. She is a very pleasant 79 year old black female who was admitted on 02/08/2013 with small bowel obstruction. She underwent surgery with findings of incarcerated right obturator hernia. This was repaired as well as repair of the left femoral hernia. A gastrostomy tube has been placed. Patient's prior cardiac history includes a history of complete heart block status post pacemaker implant. She has no prior history of congestive heart failure, coronary disease, or prior myocardial infarction. The patient has done well postoperatively until a couple days ago she began experiencing symptoms of shortness of breath. She complained of orthopnea and PND. She had increased lower extremity edema. She denies any chest pain. She has no palpitations. Betty [name] weight has increased 42 pounds from Betty [name] admission weight. She has received 2 doses of IV Lasix with improvement in Betty [name] breathing. She has no cough. She is still being fed predominantly through Betty [name] gastrostomy tube.  Review of systems complete and found to be negative unless listed above   Past Medical History  Diagnosis Date  . GERD (gastroesophageal reflux disease)   . Complete AV block 03/18/12  . BBB (bundle branch block) 03/18/12    left  . Pacemaker   . Refusal of blood transfusions as patient is Jehovah's Witness     History reviewed. No pertinent family history.  History   Social History  . Marital Status: Widowed    Spouse Name: N/A    Number of Children: N/A  . Years of Education: N/A   Occupational History  . Not on file.   Social History Main Topics  . Smoking status: Never Smoker   .  Smokeless tobacco: Never Used  . Alcohol Use: No  . Drug Use: No  . Sexually Active: No   Other Topics Concern  . Not on file   Social History Narrative  . No narrative on file    Past Surgical History  Procedure Laterality Date  . No past surgeries    . Pacemaker insertion  03/18/12    initial placement  . Laparotomy N/A 02/09/2013    Procedure: Exploratory Laparotomy; Repair of umbilical hernia; repair of obturator hernia; Placement of wound vac; placement of gastrostomy tube;  Surgeon: [name], MD;  Location: [facility] OR;  Service: General;  Laterality: N/A;  . Colon resection N/A 02/10/2013    Procedure: EXPLORATORY LAPAROTOMY, CLOSURE OF ABDOMEN;  Surgeon: [name], MD;  Location: [facility] OR;  Service: General;  Laterality: N/A;     Prescriptions prior to admission  Medication Sig Dispense Refill  . vitamin C (ASCORBIC ACID) 500 MG tablet Take 500 mg by mouth daily.        Physical Exam: Blood pressure 141/82, pulse 81, temperature 98.2 F (36.8 C), temperature source Oral, resp. rate 18, height 5' (1.524 m), weight 133 lb 13.1 oz (60.7 kg), SpO2 96.00%.  She is a very pleasant black female in no acute distress. HEENT: Normocephalic, atraumatic. Pupils are equal round and reactive to light. Sclera are clear. Oropharynx is clear. Neck is supple with no bruits, adenopathy, or thyromegaly. She does have jugular venous distention to 8 cm. Lungs: Decreased breath sounds and dullness to percussion in both bases. Mild  bibasilar rales. Cardiovascular: Regular rate and rhythm. Normal S1 and S2. Grade 2/6 systolic murmur heard best at the apex radiating to the left axilla. There is no S3. Pacer site in the left subclavian area is well healed. Abdomen: Mildly distended. Surgical dressing is in place. Gastrostomy tube is in place. Nontender. Bowel sounds are positive. Extremities: Femoral and pedal pulses are 2+ and symmetric. She has 1-2+ lower extremity edema.  Skin: Warm and  dry Neuro: Alert and oriented x3. Cranial nerves II through XII are intact. Labs:   Lab Results  Component Value Date   WBC 10.1 02/23/2013   HGB 8.9* 02/23/2013   HCT 26.4* 02/23/2013   MCV 83.3 02/23/2013   PLT 275 02/23/2013    Recent Labs Lab 02/22/13 1500 02/23/13 0600  NA 144 144  K 3.3* 3.7  CL 114* 113*  CO2 24 26  BUN 7 7  CREATININE 0.60 0.62  CALCIUM 7.5* 7.5*  PROT 4.9*  --   BILITOT 0.3  --   ALKPHOS 82  --   ALT 15  --   AST 33  --   GLUCOSE 90 90   Lab Results  Component Value Date   CKTOTAL 182* 02/23/2013   CKMB 14.4* 02/23/2013   TROPONINI <0.30 02/23/2013    BNP level 2146   Radiology:CHEST - 2 VIEW  Comparison: 02/15/2013  Findings: Left subclavian sequential pacemaker leads project at right atrium and right ventricle. Enlargement of cardiac silhouette. Calcified tortuous aorta. Pulmonary vascular congestion. Bibasilar pleural effusions and atelectasis. No gross infiltrate or pneumothorax. Bones diffusely demineralized. Question underlying emphysematous changes.  IMPRESSION: Enlargement of cardiac silhouette with pulmonary vascular congestion. Bibasilar pleural effusions and atelectasis. Question underlying emphysematous changes.   Original Report Authenticated By: Ulyses Southward, [name]   EKG: normal sinus rhythm with a rate of 76 beats per minute. Atrial sensing and ventricular pacing.   ASSESSMENT AND PLAN:  1. Acute volume overload. Patient's weight is up 42 pounds since admission. This is multifactorial. The patient has received IV fluids but is difficult to track how positive she is in this regard. She has low protein stores which contribute to Betty [name] edema. Prior to June 13 Betty [name] chest x-ray looked fairly clear but by June 17 she developed progressive pleural effusions. We will check thyroid function studies. I will start Betty [name] on Lasix 40 mg per gastrostomy twice daily. We'll need to monitor potassium and magnesium levels. We will obtain an  echocardiogram. Need to continue efforts to improve Betty [name] nutritional status and protein stores. We will repeat a CXR in 2-3 days to make sure effusions improving.  2. Status post surgery for small bowel obstruction.  3. Complete heart block status post pacemaker implant with stable pacemaker function.  SignedTheron Arista [facility] 02/23/2013, 11:40 AM

## 2013-02-23 NOTE — Progress Notes (Signed)
Pt tube feed pump read no feed checked tubing and noted that it was clogged applied 2 Lipase capsules and crushed 650 sodium bicarbonate tablets per protocol. Ilean Skill LPN

## 2013-02-23 NOTE — Progress Notes (Signed)
Patient seen and examined.  Will ask Cardiology to see re CHF.

## 2013-02-23 NOTE — Care Management Note (Signed)
Page 2 of 3   03/02/2013     2:53:38 PM   CARE MANAGEMENT NOTE 03/02/2013  Patient:  Betty [name], Betty [name]   Account Number:  [id number]  Date Initiated:  02/11/2013  Documentation initiated by:  Alvira Philips Assessment:   79 yr-old s/p colon resection/colostomy; lives with dtr, has cane that she sometimes uses for ambulation     Action/Plan:   Anticipated DC Date:  03/01/2013   Anticipated DC Plan:  HOME W HOME HEALTH SERVICES      DC Planning Services  CM consult      PAC Choice  DURABLE MEDICAL EQUIPMENT  HOME HEALTH   Choice offered to / List presented to:  C-4 Adult Children   DME arranged  WALKER - ROLLING  TUBE FEEDING  TUBE FEEDING PUMP  3-N-1  SHOWER STOOL  WHEELCHAIR - MANUAL        HH arranged  HH-1 RN  HH-2 PT  HH-3 OT      HH agency  [facility]   Status of service:  Completed, signed off Medicare Important Message given?   (If response is "NO", the following Medicare IM given date fields will be blank) Date Medicare IM given:   Date Additional Medicare IM given:    Discharge Disposition:    Per UR Regulation:  Reviewed for med. necessity/level of care/duration of stay  If discussed at Long Length of Stay Meetings, dates discussed:   02/15/2013  02/17/2013  02/22/2013    Comments:  Contact: Gertha Calkin, dtr [phone]   03-02-13 at 1451 Physican Order Sheet completed and signed by MD , fax to [facility] ( phone [phone] , fax [phone] ) , Elaine aware.   Ronny Flurry RN BSN [phone]  03-02-13 [facility] Consuella Lose [phone] called today stating DME orders need to be sign by MD not PA . [facility] faxed Physican Order Sheet . Called Patient's daughter discussed DME needs again . They have a walker and wheel chair ( borrowed ) so they do not want those ordered. Do want bedside commode , tube feeding pump and shower chair and rollator . Form completed waiting on MD to finish surgery for signature.  Ronny Flurry RN BSN [phone]   03-01-13 Janace Litten at [place] and confirmed patient is discharging today . Joni confirmed fax was recieved .  Ronny Flurry RN BSN [phone]     02-28-13 Plan to discharge patient to home tomorrow 03-01-13 , spoke with Joni at [place] and re fax orders / notes  Ronny Flurry RN BSN [phone]  02-28-13  Late note . Spoke with Paula Compton at [employer] regarding Medicare covering cost of tube feedings. Per [facility] will not pay for tube feedings due to patient taking some PO's ( even though Albumin low) .  Paula Compton ( fax [phone] )  requesting MD note of what she is eating PO , ST swallow eval note , DX why she cannot take in enough PO i.e DX esophageal stricture .  Spoke with [facility] PA, patient is taking PO's better now , plan to discharge patinet home with no tube feedings .  Ronny Flurry RN BSN  02-24-13  Called Dianne at [facility] , fax [phone] , gave update and fax updated clinicals . Spoke with patinet and daughter Ms Jamesetta Orleans and several family members at bedside . Ms Jamesetta Orleans comfortable taking her mother home at discharge and requested  3 in 1 and shower stool , same ordered.  Ronny Flurry RN BSN [phone]   02/21/13 0919 Verdis Prime RN MSN BSN CCM PT recommends home health PT and rolling walker.  Provided list of home health agencies in [place].  Daughter states she wants to talk with her sister before choosing agency. 1050 Pt now to d/c on nocturnal tube feeds and will need RN visits as well.  Discussed with dtr who is primary caregiver - she will need to learn how to administer tube feedings, she continues to refuse to consider SNF. Daughter has chosen [facility], referral made.  02/16/13 0850 Henrietta Mayo RN MSN BSN CCM Pt and dtr plan d/c to home, do not want to consider SNF - dtr states family will be able to provide 24/7 care.

## 2013-02-23 NOTE — Progress Notes (Addendum)
Received critical lab results, called and notified cardiology's office as per NP and spoke to Dr. Lubertha Basque nurse regarding abnormal labs, will put her on the list to be seen by cardiologist.

## 2013-02-23 NOTE — Progress Notes (Signed)
Bilateral lower extremity venous duplex:  No evidence of DVT, superficial thrombosis, or Baker's Cyst.   

## 2013-02-23 NOTE — Progress Notes (Signed)
13 Days Post-Op  Subjective: Pt states she is feeling less short of breath.  She denies chest pains, palpitations, orthopnea or PND.  She reports urinary frequency following lasix, intake and output are inaccurate.  +bm x2 yesterday.   Daughter is at bedside.  Objective: Vital signs in last 24 hours: Temp:  [97.9 F (36.6 C)-98.6 F (37 C)] 98.2 F (36.8 C) (06/25 0500) Pulse Rate:  [77-81] 81 (06/25 0500) Resp:  [16-18] 18 (06/25 0500) BP: (116-141)/(55-82) 141/82 mmHg (06/25 0500) SpO2:  [96 %-100 %] 96 % (06/25 0500) Weight:  [134 lb 7.7 oz (61 kg)] 134 lb 7.7 oz (61 kg) (06/25 0500) Last BM Date: 02/22/13  Intake/Output from previous day: 06/24 0701 - 06/25 0700 In: 912 [P.O.:360; NG/GT:552] Out: 450 [Urine:450] Intake/Output this shift:   General appearance: alert, cooperative, appears stated age and no distress  Resp: diminished bilaterally.  Cardio: regular rate and rhythm, S1, S2 normal, 2/6 SEM, no click, rub or gallop. No cyanosis.  GI: soft, non-tender; bowel sounds normal; no masses, no organomegaly. Midline incision-small area or necrosis superior aspect of the wound otherwise clean with adequate granulation tissue.  Pulses: 2+ and symmetric  Skin: Skin color, texture, turgor normal. No rashes or lesions. +3 pitting edema to BLE.   Lab Results:   Recent Labs  02/22/13 1500 02/23/13 0600  WBC 13.7* 10.1  HGB 9.3* 8.9*  HCT 27.4* 26.4*  PLT 297 275   BMET  Recent Labs  02/22/13 1500 02/23/13 0600  NA 144 144  K 3.3* 3.7  CL 114* 113*  CO2 24 26  GLUCOSE 90 90  BUN 7 7  CREATININE 0.60 0.62  CALCIUM 7.5* 7.5*   PT/INR No results found for this basename: LABPROT, INR,  in the last 72 hours ABG No results found for this basename: PHART, PCO2, PO2, HCO3,  in the last 72 hours  Studies/Results: Dg Chest 2 View  02/22/2013   *RADIOLOGY REPORT*  Clinical Data: Shortness of breath  CHEST - 2 VIEW  Comparison: 02/15/2013  Findings: Left subclavian  sequential pacemaker leads project at right atrium and right ventricle. Enlargement of cardiac silhouette. Calcified tortuous aorta. Pulmonary vascular congestion. Bibasilar pleural effusions and atelectasis. No gross infiltrate or pneumothorax. Bones diffusely demineralized. Question underlying emphysematous changes.  IMPRESSION: Enlargement of cardiac silhouette with pulmonary vascular congestion. Bibasilar pleural effusions and atelectasis. Question underlying emphysematous changes.   Original Report Authenticated By: Ulyses Southward, [name]    Anti-infectives: Anti-infectives   Start     Dose/Rate Route Frequency Ordered Stop   02/11/13 1200  piperacillin-tazobactam (ZOSYN) IVPB 3.375 g  Status:  Discontinued     3.375 g 12.5 mL/hr over 240 Minutes Intravenous 3 times per day 02/11/13 1002 02/15/13 0752   02/09/13 0600  cefOXitin (MEFOXIN) 2 g in dextrose 5 % 50 mL IVPB     2 g 100 mL/hr over 30 Minutes Intravenous On call to O.R. 02/08/13 2311 02/08/13 2353   02/09/13 0600  cefOXitin (MEFOXIN) 1 g in dextrose 5 % 50 mL IVPB  Status:  Discontinued     1 g 100 mL/hr over 30 Minutes Intravenous 3 times per day 02/09/13 4098 02/11/13 1191      Assessment/Plan: Exploratory laparotomy, incarcerated right hernia, left femoral hernia, periumbilical hernia with incarcerated colonsmall bowel strictureplasty -clinically improving.  Continue with BID wet to dry dressing changes.  She has a small area or necrosis that may need debridement but minimal. -night time feeds per PEG tube and  encourage oral intake daytime. Her daughter will need teaching. She will also need home health for dressing changes.   Possible CHF -She does not have a diagnosis of CHF.  She has seen Dr. Ladona Ridgel for complete heart block and pacemaker insertion.  I will contact Dr. Lubertha Basque office.  Obtain an EKG and cardiac enzymes to rule out an acute cardiac event -give an additional 20mg  of lasix. -strict intake and output -daily  weight   LOS: 15 days    Kassie Keng, [facility] ANP-BC Pager [phone] 02/23/2013 8:09 AM

## 2013-02-23 NOTE — Progress Notes (Signed)
PT Cancellation Note  Patient Details Name: Betty [name] MRN: [id number] DOB: Feb 10, 1915   Cancelled Treatment:     RN requesting to hold due to pt with CP earlier, abnormal lab results, & awaiting cardiologist to assess pt.    Will attempt back later today if time allows.       Verdell Face, [name] [phone] 02/23/2013

## 2013-02-24 DIAGNOSIS — I059 Rheumatic mitral valve disease, unspecified: Secondary | ICD-10-CM

## 2013-02-24 LAB — BASIC METABOLIC PANEL
BUN: 9 mg/dL (ref 6–23)
Calcium: 7.8 mg/dL — ABNORMAL LOW (ref 8.4–10.5)
Chloride: 111 mEq/L (ref 96–112)
Creatinine, Ser: 0.61 mg/dL (ref 0.50–1.10)
GFR calc Af Amer: 87 mL/min — ABNORMAL LOW (ref >90)

## 2013-02-24 LAB — COMPREHENSIVE METABOLIC PANEL
ALT: 19 U/L (ref 0–35)
AST: 39 U/L — ABNORMAL HIGH (ref 0–37)
Albumin: 1.7 g/dL — ABNORMAL LOW (ref 3.5–5.2)
Alkaline Phosphatase: 85 U/L (ref 39–117)
BUN: 10 mg/dL (ref 6–23)
CO2: 28 mEq/L (ref 19–32)
Calcium: 7.6 mg/dL — ABNORMAL LOW (ref 8.4–10.5)
Chloride: 109 mEq/L (ref 96–112)
Creatinine, Ser: 0.66 mg/dL (ref 0.50–1.10)
GFR calc Af Amer: 84 mL/min — ABNORMAL LOW (ref >90)
GFR calc non Af Amer: 73 mL/min — ABNORMAL LOW (ref >90)
Glucose, Bld: 97 mg/dL (ref 70–99)
Potassium: 3.8 mEq/L (ref 3.5–5.1)
Sodium: 144 mEq/L (ref 135–145)
Total Bilirubin: 0.2 mg/dL — ABNORMAL LOW (ref 0.3–1.2)
Total Protein: 5 g/dL — ABNORMAL LOW (ref 6.0–8.3)

## 2013-02-24 LAB — GLUCOSE, CAPILLARY
Glucose-Capillary: 110 mg/dL — ABNORMAL HIGH (ref 70–99)
Glucose-Capillary: 97 mg/dL (ref 70–99)
Glucose-Capillary: 79 mg/dL (ref 70–99)
Glucose-Capillary: 95 mg/dL (ref 70–99)
Glucose-Capillary: 97 mg/dL (ref 70–99)

## 2013-02-24 LAB — CBC
HCT: 29.8 % — ABNORMAL LOW (ref 36.0–46.0)
MCH: 28 pg (ref 26.0–34.0)
MCV: 84.2 fL (ref 78.0–100.0)
Platelets: 290 10*3/uL (ref 150–400)
RDW: 16.6 % — ABNORMAL HIGH (ref 11.5–15.5)

## 2013-02-24 LAB — MAGNESIUM: Magnesium: 1.4 mg/dL — ABNORMAL LOW (ref 1.5–2.5)

## 2013-02-24 MED ORDER — CHLORHEXIDINE GLUCONATE 0.12 % MT SOLN
OROMUCOSAL | Status: AC
  Administered 2013-02-24: 15 mL
  Filled 2013-02-24: qty 15

## 2013-02-24 NOTE — Progress Notes (Signed)
NUTRITION FOLLOW UP  Intervention:   1.  Enteral nutrition; Continue with Osmolite 1.2 @ 35 mL/hr x 12 hrs to supplement PO intake.   2.  General healthful diet; encourage intake of meals and supplements.  Encourage protein. MD please include Ensure (or nutrition supplement of choice) BID on home medication list.   Nutrition Dx:   Inadequate oral intake now r/t to post-op ileus.  Ongoing.   Goal:   Initiate nutrition support if TFs at discharge appropriate Improvement in PO intake.   Monitor:   Nutrition support initiation, respiratory status, weight, labs, I/O's  Assessment:   Pt has been able to advance diet to Regular with tolerance.  Her appetite and intake are improving per daughter.  Breakfast tray with minimal intake, and sipping on Ensure throughout the day- may be completing 1 Ensure/day.  Pt tolerated Osmolite 1.2 @ 35 ml/hr overnight which provides 504 kcal, 31g protein, 322 mL free water.     Discussed nutrition care plan with daughter and pt.  Pt continues to c/o altered taste and poor appetite. Encouraged intake PO and discussed goal of PO intake meeting 100% of estimated needs in time.   Pt found to meet criteria for severe malnutrition in the context of chronic illness given severe muscle loss (temples, clavicles, shoulders) and severe subcutaneous fat loss (biceps/triceps, below the eyes).  Height: Ht Readings from Last 1 Encounters:  02/13/13 5' (1.524 m)    Weight Status:   Wt Readings from Last 1 Encounters:  02/24/13 137 lb 9.5 oz (62.413 kg)  Wt 91 lbs on admission.   Re-estimated needs:  Kcal: 1400-1600 Protein: 65-75g Fluid: >1.5 L/day  Skin: incisions, pt with peripheral edema  Diet Order: General   Intake/Output Summary (Last 24 hours) at 02/24/13 1412 Last data filed at 02/24/13 0901  Gross per 24 hour  Intake     80 ml  Output   1051 ml  Net   -971 ml    Last BM: 6/24   Labs:   Recent Labs Lab 02/19/13 0500  02/23/13 0600  02/24/13 0555 02/24/13 1125  NA 141  < > 144 143 144  K 3.8  < > 3.7 3.8 3.8  CL 114*  < > 113* 111 109  CO2 22  < > 26 25 28   BUN 10  < > 7 9 10   CREATININE 0.60  < > 0.62 0.61 0.66  CALCIUM 7.1*  < > 7.5* 7.8* 7.6*  MG 1.5  --   --  1.4*  --   GLUCOSE 106*  < > 90 119* 97  < > = values in this interval not displayed.  CBG (last 3)   Recent Labs  02/24/13 0403 02/24/13 0814 02/24/13 1159  GLUCAP 110* 97 79    Scheduled Meds: . antiseptic oral rinse  15 mL Mouth Rinse BID  . enoxaparin (LOVENOX) injection  20 mg Subcutaneous Q24H  . feeding supplement  237 mL Oral TID WC    Continuous Infusions: . feeding supplement (OSMOLITE 1.2 CAL) 1,000 mL (02/23/13 1848)    Loyce Dys, MS RD LDN Clinical Inpatient Dietitian Pager: [phone] Weekend/After hours pager: [phone]

## 2013-02-24 NOTE — Progress Notes (Signed)
PT Cancellation Note  Patient Details Name: Luda Charbonneau MRN: [id number] DOB: 01/17/1915   Cancelled Treatment:    Reason Eval/Treat Not Completed: Fatigue/lethargy limiting ability to participate (Pt has been up/down to [facility] multiple times per pt/daughter and is fatigued)   Derek Laughter 02/24/2013, 3:23 PM

## 2013-02-24 NOTE — Progress Notes (Signed)
Echocardiogram 2D Echocardiogram has been performed.  Betty [name] 02/24/2013, 1:29 PM

## 2013-02-24 NOTE — Progress Notes (Addendum)
Patient ID: Betty [name], female   DOB: 06-09-1915, 79 y.o.   MRN: [id number]  14 Days Post-Op  Subjective: Pt down for echo, daughter in room, pt doing well over all although appetite still not good but improving, has issues with swallowing, long discussion with her about her mother's status prior to admission, apparently she was having significant dysphagia due to esophageal strictures, she actually went for a dilation but the procedure was cancelled due to a complication.  At home her PO intact was minimal although the daughter thought it was better, here she is only able to take soft and liquid but has such a poor appetite that she is not taking enough in to sustain her.  Objective: Vital signs in last 24 hours: Temp:  [98.2 F (36.8 C)-98.7 F (37.1 C)] 98.2 F (36.8 C) (06/26 0521) Pulse Rate:  [76-87] 87 (06/26 0521) Resp:  [16-17] 17 (06/26 0521) BP: (117-122)/(56-81) 117/81 mmHg (06/26 0521) SpO2:  [97 %-100 %] 100 % (06/26 0521) Weight:  [137 lb 9.5 oz (62.413 kg)] 137 lb 9.5 oz (62.413 kg) (06/26 0521) Last BM Date: 02/22/13  Intake/Output from previous day: 06/25 0701 - 06/26 0700 In: 440 [P.O.:440] Out: 1851 [Urine:1850; Stool:1] Intake/Output this shift: Total I/O In: -  Out: 251 [Urine:250; Stool:1]    Lab Results:   Recent Labs  02/23/13 0600 02/24/13 0555  WBC 10.1 13.2*  HGB 8.9* 9.9*  HCT 26.4* 29.8*  PLT 275 290   BMET  Recent Labs  02/23/13 0600 02/24/13 0555  NA 144 143  K 3.7 3.8  CL 113* 111  CO2 26 25  GLUCOSE 90 119*  BUN 7 9  CREATININE 0.62 0.61  CALCIUM 7.5* 7.8*   PT/INR No results found for this basename: LABPROT, INR,  in the last 72 hours ABG No results found for this basename: PHART, PCO2, PO2, HCO3,  in the last 72 hours  Studies/Results: Dg Chest 2 View  02/22/2013   *RADIOLOGY REPORT*  Clinical Data: Shortness of breath  CHEST - 2 VIEW  Comparison: 02/15/2013  Findings: Left subclavian sequential pacemaker leads project at  right atrium and right ventricle. Enlargement of cardiac silhouette. Calcified tortuous aorta. Pulmonary vascular congestion. Bibasilar pleural effusions and atelectasis. No gross infiltrate or pneumothorax. Bones diffusely demineralized. Question underlying emphysematous changes.  IMPRESSION: Enlargement of cardiac silhouette with pulmonary vascular congestion. Bibasilar pleural effusions and atelectasis. Question underlying emphysematous changes.   Original Report Authenticated By: Ulyses Southward, [name]    Anti-infectives: Anti-infectives   Start     Dose/Rate Route Frequency Ordered Stop   02/11/13 1200  piperacillin-tazobactam (ZOSYN) IVPB 3.375 g  Status:  Discontinued     3.375 g 12.5 mL/hr over 240 Minutes Intravenous 3 times per day 02/11/13 1002 02/15/13 0752   02/09/13 0600  cefOXitin (MEFOXIN) 2 g in dextrose 5 % 50 mL IVPB     2 g 100 mL/hr over 30 Minutes Intravenous On call to O.R. 02/08/13 2311 02/08/13 2353   02/09/13 0600  cefOXitin (MEFOXIN) 1 g in dextrose 5 % 50 mL IVPB  Status:  Discontinued     1 g 100 mL/hr over 30 Minutes Intravenous 3 times per day 02/09/13 4098 02/11/13 1191      Assessment/Plan: Exploratory laparotomy, incarcerated right hernia, left femoral hernia, periumbilical hernia with incarcerated colonsmall bowel strictureplasty  --clinically improving.  Continue with BID wet to dry dressing changes.    --night time feeds per PEG tube and encourage oral intake daytime.  Long discussion with her about her mother's status prior to admission, apparently she was having significant dysphagia due to esophageal strictures, she actually went for a diluation but the procedure was cancelled due to a complication.  At home her PO intact was minimal although the daugther tought it was better, here she is only able to take soft and liquid but has such a poor appetite that she is not taking enough in to sustain her.   She will need the osmolite tube feeds at home, her  albumin is very low and in order for her to maintain nutrition, her PO nutrition will not be enough to maintain her nutrition.  Possible CHF  --She does not have a diagnosis of CHF.  She has seen Dr. Ladona Ridgel for complete heart block and pacemaker insertion.    --Echo today  --strict intake and output  --daily weight   LOS: 16 days    WHITE, ELIZABETH   02/24/2013 11:10 AM  Patient looks good today.  Echo results look okay also.  We will try toget the patient home by tomorrow.  Marta Lamas. Gae Bon, MD, FACS [phone] [facility]

## 2013-02-25 DIAGNOSIS — E46 Unspecified protein-calorie malnutrition: Secondary | ICD-10-CM

## 2013-02-25 LAB — BASIC METABOLIC PANEL
BUN: 11 mg/dL (ref 6–23)
CO2: 29 mEq/L (ref 19–32)
Calcium: 7.9 mg/dL — ABNORMAL LOW (ref 8.4–10.5)
Creatinine, Ser: 0.67 mg/dL (ref 0.50–1.10)
Glucose, Bld: 96 mg/dL (ref 70–99)
Sodium: 145 mEq/L (ref 135–145)

## 2013-02-25 LAB — CBC
HCT: 30.8 % — ABNORMAL LOW (ref 36.0–46.0)
Hemoglobin: 10.2 g/dL — ABNORMAL LOW (ref 12.0–15.0)
MCH: 27.8 pg (ref 26.0–34.0)
MCHC: 33.1 g/dL (ref 30.0–36.0)
MCV: 83.9 fL (ref 78.0–100.0)
RBC: 3.67 MIL/uL — ABNORMAL LOW (ref 3.87–5.11)

## 2013-02-25 LAB — GLUCOSE, CAPILLARY: Glucose-Capillary: 91 mg/dL (ref 70–99)

## 2013-02-25 MED ORDER — MAGNESIUM OXIDE 400 (241.3 MG) MG PO TABS
400.0000 mg | ORAL_TABLET | Freq: Every day | ORAL | Status: DC
  Administered 2013-02-25 – 2013-03-01 (×5): 400 mg via ORAL
  Filled 2013-02-25 (×5): qty 1

## 2013-02-25 MED ORDER — FUROSEMIDE 40 MG PO TABS
40.0000 mg | ORAL_TABLET | Freq: Every day | ORAL | Status: DC
  Administered 2013-02-25: 40 mg via ORAL
  Filled 2013-02-25 (×2): qty 1

## 2013-02-25 NOTE — Progress Notes (Signed)
Trying to ge home tube feedings  Marta Lamas. Gae Bon, MD, FACS [phone] Surgery  approved.

## 2013-02-25 NOTE — Progress Notes (Addendum)
Physical Therapy Treatment Patient Details Name: Betty [name] MRN: [id number] DOB: [birth year] Today's Date: 02/25/2013 Time: 5366-4403 PT Time Calculation (min): 52 min  PT Assessment / Plan / Recommendation  PT Comments   Very slow progress with physical therapy. Patient and daughter initially reluctant to any therapy today because patient is too "weak." Spent a long time explaining the benefits of exercise to improve her strength for safety at home and patient agreeable however we were limited by loose stool. Patient has not been ambulating per daughter. She has had several cancelled PT sessions and she has gotten rather weak while here. Discussed possible SNF as I feel this would be the best d/c plan however daughter wants to bring her home. If she goes home soon she will need to go home at wheelchair level. I discussed this with her daughter who is agreeable. I gave her the handout for w/c on the steps because they have 10 steps to get into the house. I went over this in detail with her. Will focus our next PT session on safe transfer assistance from daughter and other family members who may be available.   Follow Up Recommendations  Home health PT;Supervision/Assistance - 24 hour (daughter wants to bring her home even after my discussion with her today)     Does the patient have the potential to tolerate intense rehabilitation     Barriers to Discharge        Equipment Recommendations  Rolling walker with 5" wheels;3in1 (PT)    Recommendations for Other Services    Frequency Min 3X/week   Progress towards PT Goals Progress towards PT goals: Progressing toward goals (slowly)  Plan      Precautions / Restrictions Precautions Precautions: Fall Precaution Comments: No abdominal binder on her this PM Restrictions Weight Bearing Restrictions: No   Pertinent Vitals/Pain Denies pain    Mobility  Bed Mobility Rolling Right: 3: Mod assist Right Sidelying to Sit: 3: Mod assist Details  for Bed Mobility Assistance: sequencing cues, facilitation for follow through, patient needing several rests during movement to catch her breath Transfers Transfers: Sit to Stand;Stand to Sit;Stand Pivot Transfers Sit to Stand: 3: Mod assist Stand to Sit: 3: Mod assist Stand Pivot Transfers: 3: Mod assist Details for Transfer Assistance: mod sequencing cues as well as facilitation for lift off and stability  Ambulation/Gait Ambulation/Gait Assistance: Not tested (comment) Ambulation/Gait Assistance Details: pt attempted but limited by fatigue and needing to sit on 3in1 for BM for extended period of time      PT Goals (current goals can now be found in the care plan section)    Visit Information  Last PT Received On: 02/25/13 Assistance Needed: +1    Subjective Data      Cognition  Cognition Arousal/Alertness: Awake/alert Overall Cognitive Status: Within Functional Limits for tasks assessed    Balance     End of Session PT - End of Session Equipment Utilized During Treatment: Gait belt Activity Tolerance: Patient limited by fatigue (bowel movement) Patient left: Other (comment) (on 3in1) Nurse Communication: Mobility status (spoke with nurse tech about helping pt off 3in1)   GP     [facility] HELEN 02/25/2013, 4:05 PM

## 2013-02-25 NOTE — Progress Notes (Signed)
Occupational Therapy Treatment Patient Details Name: Betty [name] MRN: [id number] DOB: 05-May-1915 Today's Date: 02/25/2013 Time: 4098-1191 OT Time Calculation (min): 40 min  OT Assessment / Plan / Recommendation  OT comments  Pt. Able to participate in skilled o.t., limited participation and increased time required for completion of tasks secondary to frequent sob and required rest breaks.  Daughter present and will be primary caregiver at home.  Daughter is supportive and provides active physical/emotional support to pt. During all tasks.  Follow Up Recommendations  Home health OT;Supervision/Assistance - 24 hour           Equipment Recommendations  3 in 1 bedside comode        Frequency Min 2X/week       Plan Discharge plan remains appropriate    Precautions / Restrictions Precautions Precautions: Fall Restrictions Weight Bearing Restrictions: No   Pertinent Vitals/Pain 0/10    ADL  Toilet Transfer: Performed;Minimal assistance Toilet Transfer Method: Stand pivot;Sit to Barista: Bedside commode Toileting - Clothing Manipulation and Hygiene: Performed;Moderate assistance Where Assessed - Toileting Clothing Manipulation and Hygiene: Standing Transfers/Ambulation Related to ADLs: stand pivot, slow to initiate movements and cues for hand placment during transfer to create the turn, as pt. initially wanted to put both hands on the bed vs. one to create the turning motion.  reviewed with pt. and dtr. who was present that this would create an un safe position as pt. would be "stuck" and not able to sit ADL Comments: daughter present and will be main care giver.  reviewed all aspects of safe transfer and positioning for pt. and care giver safety.    OT Diagnosis:    OT Problem List:   OT Treatment Interventions:     OT Goals(current goals can now be found in the care plan section)    Visit Information  Last OT Received On: 02/25/13    Subjective Data   "i like to rest and go to sleep after i use the commode, so i want to get back to bed please"          Cognition  Cognition Arousal/Alertness: Awake/alert Overall Cognitive Status: Within Functional Limits for tasks assessed    Mobility  Bed Mobility Bed Mobility: Sit to Supine;Sit to Sidelying Right Sit to Supine: 4: Min assist;HOB flat Sit to Sidelying Right: 3: Mod assist;HOB flat Details for Bed Mobility Assistance: provided step by step inst. for pt. and dtr. for sidelying to suping, pt. unable to maintain sidelying and rolled back while assisting b les into bed, noted to pt./dtr. to be aware of this as pt. could have hit head on bed rail on opposite side of bed, pt. weak with poor trunk control  Transfers Transfers: Sit to Stand;Stand to Sit Sit to Stand: 4: Min assist;From chair/3-in-1;With upper extremity assist Stand to Sit: 4: Min assist;3: Mod assist;With upper extremity assist;To bed Details for Transfer Assistance: min/mod a for sit/stand and to initiate pivotal steps               End of Session OT - End of Session Activity Tolerance: Patient limited by fatigue Patient left: in bed;with call bell/phone within reach;with family/visitor present       Robet Leu, COTA/L 02/25/2013, 9:33 AM

## 2013-02-25 NOTE — Plan of Care (Deleted)
Problem: Acute Rehab PT Goals Goal: Pt will Roll Supine to Side With daughter providing assistance Goal: Patient Will Transfer Sit To/From Stand With daughter providing assistance Goal: Pt Will Transfer Bed To Chair/Chair To Bed With daughter providing assistance Goal: Pt Will Ambulate With daughter providing assistance Goal: Pt Will Go Up/Down Stairs With daughter providing assistance

## 2013-02-25 NOTE — Progress Notes (Signed)
TELEMETRY: Reviewed telemetry pt in NSR with atrial sensing and ventricular pacing.: Filed Vitals:   02/24/13 0521 02/24/13 1322 02/24/13 2114 02/25/13 0513  BP: 117/81 130/51 110/64 114/52  Pulse: 87 96 95 80  Temp: 98.2 F (36.8 C) 98.2 F (36.8 C) 98.5 F (36.9 C) 97.8 F (36.6 C)  TempSrc:  Oral Oral Oral  Resp: 17 16 16 16   Height:      Weight: 137 lb 9.5 oz (62.413 kg)   126 lb 1.7 oz (57.2 kg)  SpO2: 100% 99% 98% 97%    Intake/Output Summary (Last 24 hours) at 02/25/13 0816 Last data filed at 02/24/13 1425  Gross per 24 hour  Intake    120 ml  Output    501 ml  Net   -381 ml    SUBJECTIVE SOB is better but still gets out of breath. Still edematous.  LABS: Basic Metabolic Panel:  Recent Labs  04/54/09 0555 02/24/13 1125  NA 143 144  K 3.8 3.8  CL 111 109  CO2 25 28  GLUCOSE 119* 97  BUN 9 10  CREATININE 0.61 0.66  CALCIUM 7.8* 7.6*  MG 1.4*  --    Liver Function Tests:  Recent Labs  02/22/13 1500 02/24/13 1125  AST 33 39*  ALT 15 19  ALKPHOS 82 85  BILITOT 0.3 0.2*  PROT 4.9* 5.0*  ALBUMIN 1.6* 1.7*   No results found for this basename: LIPASE, AMYLASE,  in the last 72 hours CBC:  Recent Labs  02/23/13 0600 02/24/13 0555  WBC 10.1 13.2*  HGB 8.9* 9.9*  HCT 26.4* 29.8*  MCV 83.3 84.2  PLT 275 290   Cardiac Enzymes:  Recent Labs  02/23/13 0900  CKTOTAL 182*  CKMB 14.4*  TROPONINI <0.30   Thyroid Function Tests:  Recent Labs  02/24/13 0555  TSH 25.301*    Radiology/Studies:  Dg Chest 2 View  02/22/2013   *RADIOLOGY REPORT*  Clinical Data: Shortness of breath  CHEST - 2 VIEW  Comparison: 02/15/2013  Findings: Left subclavian sequential pacemaker leads project at right atrium and right ventricle. Enlargement of cardiac silhouette. Calcified tortuous aorta. Pulmonary vascular congestion. Bibasilar pleural effusions and atelectasis. No gross infiltrate or pneumothorax. Bones diffusely demineralized. Question underlying  emphysematous changes.  IMPRESSION: Enlargement of cardiac silhouette with pulmonary vascular congestion. Bibasilar pleural effusions and atelectasis. Question underlying emphysematous changes.   Original Report Authenticated By: Ulyses Southward, [name]     Echo:Study Conclusions  - Left ventricle: The cavity size was normal. Wall thickness was increased in a pattern of mild LVH. There was mild focal basal hypertrophy of the septum. Systolic function was normal. The estimated ejection fraction was in the range of 55% to 60%. Wall motion was normal; there were no regional wall motion abnormalities. Doppler parameters are consistent with abnormal left ventricular relaxation (grade 1 diastolic dysfunction). - Aortic valve: Valve area: 1.59cm^2(VTI). Valve area: 1.59cm^2 (Vmax). - Mitral valve: There was moderate systolic anterior motion of the chordal structures. Mild regurgitation. - Pulmonary arteries: Systolic pressure was severely increased. - Pericardium, extracardiac: There was a left pleural effusion. Impressions:  - Proximal septal thickening and SAM with LVOT gradient of approximately 4 m/s; findings c/w HOCM physiology.    PHYSICAL EXAM General: Elderly,thin, in no acute distress. Head: Normal Neck: Negative for carotid bruits. JVD elevated to 8 cm. Lungs: decreased BS both bases with dullness to percussion.  Heart: RRR S1 S2 with 2/ systolic murmur. Abdomen: Soft, non-tender, non-distended with normoactive bowel  sounds. Gastrostomy tube and surgical dressings in place. Extremities: 2+ edema.  Distal pedal pulses are 2+ and equal bilaterally. Neuro: Alert and oriented X 3. Moves all extremities spontaneously. Psych:  Responds to questions appropriately with a normal affect.  ASSESSMENT AND PLAN: 1. Acute volume overload. Patient's weight down 11 pounds over past 2 days from IV lasix 2 days ago. Apparently has not received any additional po lasix. Weight is still up 30 pounds  this admission. She is edematous and has significant pleural effusions. Needs more diuresis. Will start lasix 40 mg daily. Most of volume overload is due to volume shifts with surgery and IV hydration. She probably has a component of diastolic dysfunction. 2. Hypomagnesemia-need to replete > 2.0 3. S/p surgery for SBO 4. CHB s/p pacemaker 5. Elevated TSH- may need thyroid replacement. Will defer to primary team.   Active Problems:   Pacemaker-[employer]   Mobitz (type) II atrioventricular block   Incarcerated hernia   Acute respiratory failure   Acute encephalopathy   Hyperglycemia   Protein-calorie malnutrition, severe   Septic shock(785.52)   Hypernatremia   Volume overload   CHF, acute    Signed, Zinedine Ellner [place] MD,FACC 02/25/2013 8:32 AM

## 2013-02-25 NOTE — Progress Notes (Signed)
15 Days Post-Op  Subjective: Pt feels good today, minimal abdominal pain.  Still having trouble breathing, but IS is helping her.  She is tolerating her food well for the most part despite her swallowing problems.  Ambulating with PT/OT.    Objective: Vital signs in last 24 hours: Temp:  [97.8 F (36.6 C)-98.5 F (36.9 C)] 97.8 F (36.6 C) (06/27 0513) Pulse Rate:  [80-96] 80 (06/27 0513) Resp:  [16] 16 (06/27 0513) BP: (110-130)/(51-64) 114/52 mmHg (06/27 0513) SpO2:  [97 %-99 %] 97 % (06/27 0513) Weight:  [126 lb 1.7 oz (57.2 kg)] 126 lb 1.7 oz (57.2 kg) (06/27 0513) Last BM Date: 02/24/13  Intake/Output from previous day: 06/26 0701 - 06/27 0700 In: 120 [P.O.:120] Out: 501 [Urine:500; Stool:1] Intake/Output this shift:    PE: Gen:  Alert, NAD, pleasant Abd: Soft, NT/ND, +BS, no HSM, midline wound C/D, G-tube in place   Lab Results:   Recent Labs  02/23/13 0600 02/24/13 0555  WBC 10.1 13.2*  HGB 8.9* 9.9*  HCT 26.4* 29.8*  PLT 275 290   BMET  Recent Labs  02/24/13 0555 02/24/13 1125  NA 143 144  K 3.8 3.8  CL 111 109  CO2 25 28  GLUCOSE 119* 97  BUN 9 10  CREATININE 0.61 0.66  CALCIUM 7.8* 7.6*   PT/INR No results found for this basename: LABPROT, INR,  in the last 72 hours CMP     Component Value Date/Time   NA 144 02/24/2013 1125   K 3.8 02/24/2013 1125   CL 109 02/24/2013 1125   CO2 28 02/24/2013 1125   GLUCOSE 97 02/24/2013 1125   BUN 10 02/24/2013 1125   CREATININE 0.66 02/24/2013 1125   CALCIUM 7.6* 02/24/2013 1125   PROT 5.0* 02/24/2013 1125   ALBUMIN 1.7* 02/24/2013 1125   AST 39* 02/24/2013 1125   ALT 19 02/24/2013 1125   ALKPHOS 85 02/24/2013 1125   BILITOT 0.2* 02/24/2013 1125   GFRNONAA 73* 02/24/2013 1125   GFRAA 84* 02/24/2013 1125   Lipase  No results found for this basename: lipase       Studies/Results: No results found.  Anti-infectives: Anti-infectives   Start     Dose/Rate Route Frequency Ordered Stop   02/11/13 1200   piperacillin-tazobactam (ZOSYN) IVPB 3.375 g  Status:  Discontinued     3.375 g 12.5 mL/hr over 240 Minutes Intravenous 3 times per day 02/11/13 1002 02/15/13 0752   02/09/13 0600  cefOXitin (MEFOXIN) 2 g in dextrose 5 % 50 mL IVPB     2 g 100 mL/hr over 30 Minutes Intravenous On call to O.R. 02/08/13 2311 02/08/13 2353   02/09/13 0600  cefOXitin (MEFOXIN) 1 g in dextrose 5 % 50 mL IVPB  Status:  Discontinued     1 g 100 mL/hr over 30 Minutes Intravenous 3 times per day 02/09/13 0337 02/11/13 0953       Assessment/Plan Exploratory laparotomy, incarcerated right hernia, left femoral hernia, periumbilical hernia with incarcerated colon, small bowel strictureplasty  --Clinically improved. Continue with BID wet to dry dressing changes.   Protein calorie malnutrition/Tube feeds --Night time feeds per PEG tube and encourage oral intake daytime.  --Osmolite tube feeds at home, her albumin is very low and in order for her to maintain nutrition, her PO nutrition will not be enough to maintain her nutrition.   Acute volume overload ? Diastolic dysfunction --Dr. [place] following --Echo shows EF 55-60%, mild LVH, normal systolic function, mild mitral regurg, high  systolic pressure of the pulm arteries, left pleural effusion --strict intake and output  --daily weight --40mg  Lasix QD per cards  Hypomagnesia - cardiology following and supplemented Complete Heart Block - s/p pacemaker  Elevated TSH - will repeat TSH to ensure not a false high, and obtain a free T4, if hypothyroidism is present will likely need to start supplementation.  Will need to see her PCP and/or endo for titration.  Unsure if she is truly symptomatic at this time since she just underwent a big surgery.    Deconditioning - HH PT/OT advised  Disp:  Ready for discharge from a surgery standpoint.  Would like cardiology's input about anticipated discharge date and planning for f/u.    LOS: 17 days    Betty [name], Betty [name] 02/25/2013,  8:31 AM Pager: [phone]

## 2013-02-26 DIAGNOSIS — E039 Hypothyroidism, unspecified: Secondary | ICD-10-CM

## 2013-02-26 DIAGNOSIS — I5031 Acute diastolic (congestive) heart failure: Secondary | ICD-10-CM

## 2013-02-26 LAB — GLUCOSE, CAPILLARY
Glucose-Capillary: 106 mg/dL — ABNORMAL HIGH (ref 70–99)
Glucose-Capillary: 107 mg/dL — ABNORMAL HIGH (ref 70–99)
Glucose-Capillary: 108 mg/dL — ABNORMAL HIGH (ref 70–99)
Glucose-Capillary: 125 mg/dL — ABNORMAL HIGH (ref 70–99)

## 2013-02-26 MED ORDER — FUROSEMIDE 10 MG/ML IJ SOLN
40.0000 mg | Freq: Every day | INTRAMUSCULAR | Status: DC
  Administered 2013-02-26 – 2013-03-01 (×4): 40 mg via INTRAVENOUS
  Filled 2013-02-26 (×4): qty 4

## 2013-02-26 MED ORDER — LEVOTHYROXINE SODIUM 50 MCG PO TABS
50.0000 ug | ORAL_TABLET | Freq: Every day | ORAL | Status: DC
  Administered 2013-02-27 – 2013-03-01 (×3): 50 ug via ORAL
  Filled 2013-02-26 (×4): qty 1

## 2013-02-26 NOTE — Progress Notes (Signed)
.     Primary cardiologist: Dr. Lewayne Bunting  Subjective:   Shortness of breath continues, but somewhat better today. No chest pain or palpitations.   Objective:   Temp:  [97.9 F (36.6 C)-98.1 F (36.7 C)] 98.1 F (36.7 C) (06/28 0555) Pulse Rate:  [89] 89 (06/28 0555) Resp:  [16-18] 16 (06/28 0555) BP: (101-116)/(49-62) 105/50 mmHg (06/28 0555) SpO2:  [98 %-100 %] 100 % (06/28 0555) Weight:  [128 lb 1.4 oz (58.1 kg)] 128 lb 1.4 oz (58.1 kg) (06/28 0555) Last BM Date: 02/25/13  Filed Weights   02/24/13 0521 02/25/13 0513 02/26/13 0555  Weight: 137 lb 9.5 oz (62.413 kg) 126 lb 1.7 oz (57.2 kg) 128 lb 1.4 oz (58.1 kg)    Intake/Output Summary (Last 24 hours) at 02/26/13 0809 Last data filed at 02/26/13 0600  Gross per 24 hour  Intake    402 ml  Output    500 ml  Net    -98 ml   Telemetry: Atrial sensed, ventricular paced rhythm.  Exam:  General: Frail, elderly, no distress.  Lungs: Decreased BS, particularly bases.  Cardiac: RRR with 2-3/6 systolic murmur  Extremities: 1-6+ edema.  Lab Results:  Basic Metabolic Panel:  Recent Labs Lab 02/24/13 0555 02/24/13 1125 02/25/13 1210  NA 143 144 145  K 3.8 3.8 3.7  CL 111 109 108  CO2 25 28 29   GLUCOSE 119* 97 96  BUN 9 10 11   CREATININE 0.61 0.66 0.67  CALCIUM 7.8* 7.6* 7.9*  MG 1.4*  --   --     Liver Function Tests:  Recent Labs Lab 02/22/13 1500 02/24/13 1125  AST 33 39*  ALT 15 19  ALKPHOS 82 85  BILITOT 0.3 0.2*  PROT 4.9* 5.0*  ALBUMIN 1.6* 1.7*    CBC:  Recent Labs Lab 02/23/13 0600 02/24/13 0555 02/25/13 1210  WBC 10.1 13.2* 10.0  HGB 8.9* 9.9* 10.2*  HCT 26.4* 29.8* 30.8*  MCV 83.3 84.2 83.9  PLT 275 290 278    Cardiac Enzymes:  Recent Labs Lab 02/23/13 0900  CKTOTAL 182*  CKMB 14.4*  TROPONINI <0.30    BNP:  Recent Labs  02/23/13 0600  PROBNP 2146.0*    Medications:   Scheduled Medications: . antiseptic oral rinse  15 mL Mouth Rinse BID  . enoxaparin  (LOVENOX) injection  20 mg Subcutaneous Q24H  . feeding supplement  237 mL Oral TID WC  . furosemide  40 mg Oral Daily  . magnesium oxide  400 mg Oral Daily     Infusions: . feeding supplement (OSMOLITE 1.2 CAL) 1,000 mL (02/25/13 1847)     PRN Medications:  acetaminophen, albuterol, ondansetron   Assessment:   1. Acute diastolic CHF with some HOCM physiology, LVEF 60%, presumably secondary pulmonary venous hypertension. Still with evidence of volume overload. Diuresis not all that brisk with PO Lasix.  2. History of CHB s/p PPM. Rhythm stable by telemetry.  3. Post-op SBO surgery.  4. Possible hypothyroidism.  Plan/Discussion:    Will change to IV Lasix and follow output. Repeat BMET and MG tomorrow.   Jonelle Sidle, [name], F.A.C.C.

## 2013-02-26 NOTE — Progress Notes (Signed)
16 Days Post-Op  Subjective: She is stable. Tolerating tube feeds at night. Having bowel movements. Eating some during the day but appetite not good, food does not taste good to her.  Shortness of breath is slowly improving. Appreciate cardiology input. Changing to IV Lasix today. Lab work Advertising account executive.  Objective: Vital signs in last 24 hours: Temp:  [97.9 F (36.6 C)-98.1 F (36.7 C)] 98.1 F (36.7 C) (06/28 0555) Pulse Rate:  [89] 89 (06/28 0555) Resp:  [16-18] 16 (06/28 0555) BP: (101-116)/(49-62) 105/50 mmHg (06/28 0555) SpO2:  [98 %-100 %] 100 % (06/28 0555) Weight:  [128 lb 1.4 oz (58.1 kg)] 128 lb 1.4 oz (58.1 kg) (06/28 0555) Last BM Date: 02/25/13  Intake/Output from previous day: 06/27 0701 - 06/28 0700 In: 402  Out: 500 [Urine:500] Intake/Output this shift: Total I/O In: -  Out: 125 [Urine:125]  General appearance: present. Alert. In no distress. Mental status normal. Daughter in room. Resp: decreased breath sounds at bases, but no wheeze or rhonchi. GI: soft. Slightly protuberant. Good bowel sounds. Midline wound clean with healthy granulation tissue, redressed. G-tube site looks good.  Lab Results:  Results for orders placed during the hospital encounter of 02/08/13 (from the past 24 hour(s))  T4, FREE     Status: None   Collection Time    02/25/13 12:10 PM      Result Value Range   Free T4 0.93  0.80 - 1.80 ng/dL  CBC     Status: Abnormal   Collection Time    02/25/13 12:10 PM      Result Value Range   WBC 10.0  4.0 - 10.5 K/uL   RBC 3.67 (*) 3.87 - 5.11 MIL/uL   Hemoglobin 10.2 (*) 12.0 - 15.0 g/dL   HCT 40.9 (*) 81.1 - 91.4 %   MCV 83.9  78.0 - 100.0 fL   MCH 27.8  26.0 - 34.0 pg   MCHC 33.1  30.0 - 36.0 g/dL   RDW 78.2 (*) 95.6 - 21.3 %   Platelets 278  150 - 400 K/uL  BASIC METABOLIC PANEL     Status: Abnormal   Collection Time    02/25/13 12:10 PM      Result Value Range   Sodium 145  135 - 145 mEq/L   Potassium 3.7  3.5 - 5.1 mEq/L   Chloride  108  96 - 112 mEq/L   CO2 29  19 - 32 mEq/L   Glucose, Bld 96  70 - 99 mg/dL   BUN 11  6 - 23 mg/dL   Creatinine, Ser 0.86  0.50 - 1.10 mg/dL   Calcium 7.9 (*) 8.4 - 10.5 mg/dL   GFR calc non Af Amer 72 (*) >90 mL/min   GFR calc Af Amer 84 (*) >90 mL/min  TSH     Status: Abnormal   Collection Time    02/25/13 12:10 PM      Result Value Range   TSH 32.506 (*) 0.350 - 4.500 uIU/mL  GLUCOSE, CAPILLARY     Status: None   Collection Time    02/25/13 12:13 PM      Result Value Range   Glucose-Capillary 91  70 - 99 mg/dL  GLUCOSE, CAPILLARY     Status: None   Collection Time    02/25/13  4:06 PM      Result Value Range   Glucose-Capillary 73  70 - 99 mg/dL  GLUCOSE, CAPILLARY     Status: None   Collection Time  02/25/13  7:31 PM      Result Value Range   Glucose-Capillary 74  70 - 99 mg/dL  GLUCOSE, CAPILLARY     Status: Abnormal   Collection Time    02/26/13 12:03 AM      Result Value Range   Glucose-Capillary 107 (*) 70 - 99 mg/dL  GLUCOSE, CAPILLARY     Status: Abnormal   Collection Time    02/26/13  4:00 AM      Result Value Range   Glucose-Capillary 106 (*) 70 - 99 mg/dL     Studies/Results: @RISRSLT24 @  . antiseptic oral rinse  15 mL Mouth Rinse BID  . enoxaparin (LOVENOX) injection  20 mg Subcutaneous Q24H  . feeding supplement  237 mL Oral TID WC  . furosemide  40 mg Intravenous Daily  . magnesium oxide  400 mg Oral Daily     Assessment/Plan: s/p Procedure(s): EXPLORATORY LAPAROTOMY, CLOSURE OF ABDOMEN  POD#16.   Exploratory laparotomy, incarcerated right hernia, left femoral hernia, periumbilical hernia with incarcerated colon, small bowel strictureplasty  --Clinically improved. Continue with BID wet to dry dressing changes.   Protein calorie malnutrition/Tube feeds  --Night time feeds per PEG tube and encourage oral intake daytime.  --Osmolite tube feeds at home, her albumin is very low and in order for her to maintain nutrition, her PO nutrition will  not be enough to maintain her nutrition.   Acute volume overload ? Diastolic dysfunction  --Dr. [place] at al  following  --Echo shows EF 55-60%, mild LVH, normal systolic function, mild mitral regurg, high systolic pressure of the pulm arteries, left pleural effusion  --strict intake and output  --daily weight  --Switching to the IV Lasix today per cardiology  Hypomagnesia - cardiology following and supplemented  Check B. mag and magnesium level tomorrow. Complete Heart Block - s/p pacemaker   Elevated TSH - repeat TSH is 32.  Will start Synthroid 50 mcg daily.  I wonder if hypothyroidism is exacerbating her CHF. Will need to see her PCP and/or endo for titration.  .  Deconditioning - HH PT/OT advised   Disp: Ready for discharge from a surgery standpoint. Would like cardiology's input about anticipated discharge date and planning for f/u.    @PROBHOSP @  LOS: 18 days    Mazy Culton M. Derrell Lolling, [name], [facility] General and Minimally invasive Surgery Breast and Colorectal Surgery Office:   [phone] Pager:   [phone]  02/26/2013  . .prob

## 2013-02-27 LAB — BASIC METABOLIC PANEL
BUN: 14 mg/dL (ref 6–23)
Calcium: 7.8 mg/dL — ABNORMAL LOW (ref 8.4–10.5)
Chloride: 105 mEq/L (ref 96–112)
Creatinine, Ser: 0.71 mg/dL (ref 0.50–1.10)
GFR calc Af Amer: 82 mL/min — ABNORMAL LOW (ref >90)

## 2013-02-27 LAB — GLUCOSE, CAPILLARY
Glucose-Capillary: 100 mg/dL — ABNORMAL HIGH (ref 70–99)
Glucose-Capillary: 126 mg/dL — ABNORMAL HIGH (ref 70–99)

## 2013-02-27 NOTE — Progress Notes (Signed)
17 Days Post-Op  Subjective: Remain stable. Tolerating tube feeds it might be eating some during the day, but intake is not adequate. She will need to go home on tube feeds.  Received IV Lasix yesterday. She says her breathing is better today. Denies chest pain.  Be met normal this morning. BUN 14, creatinine 0.71, glucose 106, magnesium 1.5.  Started on Synthroid yesterday for persistent TSH of 32. This will need attention by her PCP as outpatient.    Objective: Vital signs in last 24 hours: Temp:  [97.9 F (36.6 C)-98.4 F (36.9 C)] 98.2 F (36.8 C) (06/29 0418) Pulse Rate:  [85-90] 85 (06/29 0418) Resp:  [18] 18 (06/29 0418) BP: (91-134)/(43-73) 91/56 mmHg (06/29 0418) SpO2:  [98 %-100 %] 100 % (06/29 0418) Last BM Date: 02/25/13  Intake/Output from previous day: 06/28 0701 - 06/29 0700 In: -  Out: 475 [Urine:475] Intake/Output this shift:     Exam: General appearance: elderly. Alert. Pleasant. Deconditioned. Daughter in room. No distress. GI: abdomen soft, a little protuberant. Good bowel sounds. Slightly tympanitic. Wound clean with healthy superficial granulation tissue. G-tube site looks good.   Lab Results:  Results for orders placed during the hospital encounter of 02/08/13 (from the past 24 hour(s))  GLUCOSE, CAPILLARY     Status: Abnormal   Collection Time    02/26/13 11:50 AM      Result Value Range   Glucose-Capillary 108 (*) 70 - 99 mg/dL   Comment 1 Notify RN    GLUCOSE, CAPILLARY     Status: None   Collection Time    02/26/13  5:24 PM      Result Value Range   Glucose-Capillary 90  70 - 99 mg/dL   Comment 1 Notify RN    GLUCOSE, CAPILLARY     Status: None   Collection Time    02/26/13  7:25 PM      Result Value Range   Glucose-Capillary 93  70 - 99 mg/dL   Comment 1 Documented in Chart     Comment 2 Notify RN    GLUCOSE, CAPILLARY     Status: Abnormal   Collection Time    02/27/13 12:39 AM      Result Value Range   Glucose-Capillary 100  (*) 70 - 99 mg/dL   Comment 1 Documented in Chart     Comment 2 Notify RN    BASIC METABOLIC PANEL     Status: Abnormal   Collection Time    02/27/13  5:34 AM      Result Value Range   Sodium 144  135 - 145 mEq/L   Potassium 3.7  3.5 - 5.1 mEq/L   Chloride 105  96 - 112 mEq/L   CO2 34 (*) 19 - 32 mEq/L   Glucose, Bld 106 (*) 70 - 99 mg/dL   BUN 14  6 - 23 mg/dL   Creatinine, Ser 1.47  0.50 - 1.10 mg/dL   Calcium 7.8 (*) 8.4 - 10.5 mg/dL   GFR calc non Af Amer 71 (*) >90 mL/min   GFR calc Af Amer 82 (*) >90 mL/min  MAGNESIUM     Status: None   Collection Time    02/27/13  5:34 AM      Result Value Range   Magnesium 1.5  1.5 - 2.5 mg/dL     Studies/Results: @RISRSLT24 @  . antiseptic oral rinse  15 mL Mouth Rinse BID  . enoxaparin (LOVENOX) injection  20 mg Subcutaneous Q24H  .  feeding supplement  237 mL Oral TID WC  . furosemide  40 mg Intravenous Daily  . levothyroxine  50 mcg Oral QAC breakfast  . magnesium oxide  400 mg Oral Daily     Assessment/Plan: s/p Procedure(s): EXPLORATORY LAPAROTOMY, CLOSURE OF ABDOMEN  POD#17. Exploratory laparotomy, incarcerated right hernia, left femoral hernia, periumbilical hernia with incarcerated colon, small bowel strictureplasty  --Clinically improved. Continue with BID wet to dry dressing changes.   Protein calorie malnutrition/Tube feeds  --Night time feeds per PEG tube and encourage oral intake daytime.  --Osmolite tube feeds at home, her albumin is very low and in order for her to maintain nutrition, her PO nutrition will not be enough to maintain her nutrition.   Acute volume overload ? Diastolic dysfunction  --Dr. [place] at al following  --Echo shows EF 55-60%, mild LVH, normal systolic function, mild mitral regurg, high systolic pressure of the pulm arteries, left pleural effusion  --strict intake and output  --daily weight  --Switching to the IV Lasix today per cardiology  Symptomatically improved this  morning.  Hypomagnesia - cardiology following and supplemented  Mg 1.5, normal today   Complete Heart Block - s/p pacemaker   Elevated TSH - repeat TSH is 32.  Synthroid 50 mcg daily started. I wonder if hypothyroidism is exacerbating her CHF. Will need to see her PCP and/or endo for titration.  .  Deconditioning - HH PT/OT advised   Disp: Ready for discharge from a surgery standpoint. Would like cardiology's input about anticipated discharge date and planning for f/u.   @PROBHOSP @  LOS: 19 days    Kylee Nardozzi M. Derrell Lolling, [name], [facility] General and Minimally invasive Surgery Breast and Colorectal Surgery Office:   [phone] Pager:   [phone]  02/27/2013  . .prob

## 2013-02-27 NOTE — Progress Notes (Signed)
.     Primary cardiologist: Dr. Lewayne Bunting  Subjective:   Felt better yesterday. Breathing more comfortably. Appetite better.   Objective:   Temp:  [97.9 F (36.6 C)-98.4 F (36.9 C)] 98.2 F (36.8 C) (06/29 0418) Pulse Rate:  [85-90] 85 (06/29 0418) Resp:  [18] 18 (06/29 0418) BP: (91-134)/(43-73) 91/56 mmHg (06/29 0418) SpO2:  [98 %-100 %] 100 % (06/29 0418) Last BM Date: 02/25/13  Filed Weights   02/24/13 0521 02/25/13 0513 02/26/13 0555  Weight: 137 lb 9.5 oz (62.413 kg) 126 lb 1.7 oz (57.2 kg) 128 lb 1.4 oz (58.1 kg)    Intake/Output Summary (Last 24 hours) at 02/27/13 0819 Last data filed at 02/26/13 1345  Gross per 24 hour  Intake      0 ml  Output    475 ml  Net   -475 ml   Telemetry: Atrial sensed, ventricular paced rhythm. Artifact noted.  Exam:  General: Frail, elderly, no distress.  Lungs: Decreased BS, particularly bases.  Cardiac: RRR with 2-3/6 systolic murmur  Extremities: 2-9+ edema.  Lab Results:  Basic Metabolic Panel:  Recent Labs Lab 02/24/13 0555 02/24/13 1125 02/25/13 1210 02/27/13 0534  NA 143 144 145 144  K 3.8 3.8 3.7 3.7  CL 111 109 108 105  CO2 25 28 29  34*  GLUCOSE 119* 97 96 106*  BUN 9 10 11 14   CREATININE 0.61 0.66 0.67 0.71  CALCIUM 7.8* 7.6* 7.9* 7.8*  MG 1.4*  --   --  1.5    CBC:  Recent Labs Lab 02/23/13 0600 02/24/13 0555 02/25/13 1210  WBC 10.1 13.2* 10.0  HGB 8.9* 9.9* 10.2*  HCT 26.4* 29.8* 30.8*  MCV 83.3 84.2 83.9  PLT 275 290 278    Medications:   Scheduled Medications: . antiseptic oral rinse  15 mL Mouth Rinse BID  . enoxaparin (LOVENOX) injection  20 mg Subcutaneous Q24H  . feeding supplement  237 mL Oral TID WC  . furosemide  40 mg Intravenous Daily  . levothyroxine  50 mcg Oral QAC breakfast  . magnesium oxide  400 mg Oral Daily    Infusions: . feeding supplement (OSMOLITE 1.2 CAL) 1,000 mL (02/25/13 1847)    PRN Medications: acetaminophen, albuterol,  ondansetron   Assessment:   1. Acute diastolic CHF with some HOCM physiology, LVEF 60%, presumably secondary pulmonary venous hypertension. Volume status improving.  2. History of CHB s/p PPM. Rhythm stable by telemetry.  3. Post-op SBO surgery.  4. Possible hypothyroidism.  Plan/Discussion:    Continue IV Lasix for now. BP limiting addition of other agents.   Jonelle Sidle, [name], F.A.C.C.

## 2013-02-28 LAB — GLUCOSE, CAPILLARY
Glucose-Capillary: 107 mg/dL — ABNORMAL HIGH (ref 70–99)
Glucose-Capillary: 111 mg/dL — ABNORMAL HIGH (ref 70–99)
Glucose-Capillary: 237 mg/dL — ABNORMAL HIGH (ref 70–99)

## 2013-02-28 NOTE — Progress Notes (Signed)
18 Days Post-Op  Subjective: Pt feels fine, no SOB.  Since starting the synthroid she feels much less weak, she's been able to mobilize and ambulate better.  +BM and flatus, urinating well.  Tolerating TF and starting to eat more.    Objective: Vital signs in last 24 hours: Temp:  [98.3 F (36.8 C)-98.9 F (37.2 C)] 98.4 F (36.9 C) (06/30 0508) Pulse Rate:  [98-100] 98 (06/30 0508) Resp:  [16-18] 18 (06/30 0508) BP: (97-114)/(50-61) 97/61 mmHg (06/30 0508) SpO2:  [100 %] 100 % (06/30 0508) Last BM Date: 02/26/13  Intake/Output from previous day: 06/29 0701 - 06/30 0700 In: 240 [P.O.:240] Out: 1000 [Urine:1000] Intake/Output this shift:    PE: Gen:  Alert, NAD, pleasant Abd: Soft, NT/ND, +BS, no HSM, midline wound C/D, good granulation   Lab Results:   Recent Labs  02/25/13 1210  WBC 10.0  HGB 10.2*  HCT 30.8*  PLT 278   BMET  Recent Labs  02/25/13 1210 02/27/13 0534  NA 145 144  K 3.7 3.7  CL 108 105  CO2 29 34*  GLUCOSE 96 106*  BUN 11 14  CREATININE 0.67 0.71  CALCIUM 7.9* 7.8*   PT/INR No results found for this basename: LABPROT, INR,  in the last 72 hours CMP     Component Value Date/Time   NA 144 02/27/2013 0534   K 3.7 02/27/2013 0534   CL 105 02/27/2013 0534   CO2 34* 02/27/2013 0534   GLUCOSE 106* 02/27/2013 0534   BUN 14 02/27/2013 0534   CREATININE 0.71 02/27/2013 0534   CALCIUM 7.8* 02/27/2013 0534   PROT 5.0* 02/24/2013 1125   ALBUMIN 1.7* 02/24/2013 1125   AST 39* 02/24/2013 1125   ALT 19 02/24/2013 1125   ALKPHOS 85 02/24/2013 1125   BILITOT 0.2* 02/24/2013 1125   GFRNONAA 71* 02/27/2013 0534   GFRAA 82* 02/27/2013 0534   Lipase  No results found for this basename: lipase       Studies/Results: No results found.  Anti-infectives: Anti-infectives   Start     Dose/Rate Route Frequency Ordered Stop   02/11/13 1200  piperacillin-tazobactam (ZOSYN) IVPB 3.375 g  Status:  Discontinued     3.375 g 12.5 mL/hr over 240 Minutes  Intravenous 3 times per day 02/11/13 1002 02/15/13 0752   02/09/13 0600  cefOXitin (MEFOXIN) 2 g in dextrose 5 % 50 mL IVPB     2 g 100 mL/hr over 30 Minutes Intravenous On call to O.R. 02/08/13 2311 02/08/13 2353   02/09/13 0600  cefOXitin (MEFOXIN) 1 g in dextrose 5 % 50 mL IVPB  Status:  Discontinued     1 g 100 mL/hr over 30 Minutes Intravenous 3 times per day 02/09/13 0337 02/11/13 0953       Assessment/Plan POD#18. Exploratory laparotomy, incarcerated right hernia, left femoral hernia, periumbilical hernia with incarcerated colon, small bowel strictureplasty  --Clinically improved. Continue with BID wet to dry dressing changes.   Protein calorie malnutrition/Tube feeds  --Night time feeds per PEG tube and encourage oral intake daytime.  --Osmolite tube feeds at home, her albumin is very low and in order for her to maintain nutrition, her PO nutrition will not be enough to maintain her nutrition.  --May be able to cut back on TF, will discuss with dietitian  Acute volume overload ? Diastolic dysfunction  --Dr. [place] at al following  --Echo shows EF 55-60%, mild LVH, normal systolic function, mild mitral regurg, high systolic pressure of the pulm arteries,  left pleural effusion  --strict intake and output  --daily weight  --Switching to the IV Lasix today per cardiology  --Symptomatically improved this morning.   Hypomagnesia - cardiology following and supplemented Mg 1.5, normal today   Complete Heart Block - s/p pacemaker   Elevated TSH - repeat TSH is 32. Synthroid 50 mcg daily started. I wonder if hypothyroidism is exacerbating her CHF. Will need to see her PCP and/or endo for titration.   Deconditioning - HH PT/OT advised   Disp: Ready for discharge from a surgery standpoint. Would like cardiology's input about anticipated discharge date and planning for f/u.     LOS: 20 days    DORT, Aundra Millet 02/28/2013, 8:24 AM Pager: [phone]

## 2013-02-28 NOTE — Progress Notes (Signed)
Physical Therapy Treatment Patient Details Name: Betty [name] MRN: [id number] DOB: Mar 31, 1915 Today's Date: 02/28/2013 Time: 4098 -0952 (AND 1040-1058) PT Time Calculation (min): 29 min total  PT Assessment / Plan / Recommendation  PT Comments   Pt making great progress with mobility today. Pt initially on 3n1, discussed home entry with wheelchair bump up, transfers, safety with gait with daughter who was able to teach back yesterdays education. Pt still on 3n1 so therapist returned later, pt able to ambulate ~35' with RW and overall min assist. Discussed CIR with pt and family who declined consult and reported they would like to return home with HHPT.   Pt reports she loves to wear 2" heels (at 95!), PT recommended pt avoid these until strength and balance improved and to then discuss with HHPT. Pt verbalized understanding.    Follow Up Recommendations  Home health PT;Supervision/Assistance - 24 hour        Barriers to Discharge   Difficult to access home      Equipment Recommendations  Rolling walker with 5" wheels;3in1 (PT);Other (comment) (tub bench, may or may not need 3n1 (check before order))       Frequency Min 3X/week   Progress towards PT Goals Progress towards PT goals: Progressing toward goals  Plan Discharge plan needs to be updated    Precautions / Restrictions Precautions Precautions: Fall Required Braces or Orthoses: Other Brace/Splint Other Brace/Splint: abdominal binder Restrictions Weight Bearing Restrictions: No   Pertinent Vitals/Pain No c/o pain    Mobility  Bed Mobility Bed Mobility: Not assessed (daugher helped pt OOB, verbalized correct method) Transfers Transfers: Sit to Stand;Stand to Sit Sit to Stand: 4: Min assist;With upper extremity assist;From chair/3-in-1 Stand to Sit: 4: Min assist;With upper extremity assist;To chair/3-in-1 Details for Transfer Assistance: cues for hand placement.  Min A for forward translation of trunk. Pt working with OT  upon PT reentry, preparing to stand Ambulation/Gait Ambulation/Gait Assistance: 4: Min Metallurgist Ambulation/Gait Assistance Details: Refused to go in hallway (without community clothing on), walked to door and back x 2. Cues and facilitation for improved posture.  Gait Pattern: Shuffle;Trunk flexed;Decreased stride length;Step-to pattern Stairs: No      PT Goals (current goals can now be found in the care plan section)    Visit Information  Last PT Received On: 02/28/13 Assistance Needed: +1    Subjective Data   I want to go home fast as possible   Cognition  Cognition Arousal/Alertness: Awake/alert Behavior During Therapy: WFL for tasks assessed/performed Overall Cognitive Status: Within Functional Limits for tasks assessed    Balance  Balance Balance Assessed: Yes Dynamic Standing Balance Dynamic Standing - Balance Support: No upper extremity supported Dynamic Standing - Level of Assistance: 4: Min assist Dynamic Standing - Balance Activities: Other (comment) (simulated pulling up pants)  End of Session PT - End of Session Equipment Utilized During Treatment: Gait belt Activity Tolerance: Patient limited by fatigue Patient left: in chair;with call bell/phone within reach;with family/visitor present Nurse Communication: Mobility status   GP     Betty [name] 02/28/2013, 1:09 PM

## 2013-02-28 NOTE — Progress Notes (Signed)
Physical Therapy Treatment Patient Details Name: Betty [name] MRN: [id number] DOB: 07/14/1915 Today's Date: 02/17/2013 Time: 6213-0865 PT Time Calculation (min): 23 min  PT Assessment / Plan / Recommendation Comments on Treatment Session  Pt able to increase ambulation distand short amount today.  No RW available therefore performed with bil HHA.    Pt & daughter state she will have 24 hr (A) at home.  If pt cont's to demonstrate progress with mobility she will be appropriate to d/c home with HHPT, HHOT.  She will also need a RW & 3-in-1.       Follow Up Recommendations  Home health PT;Supervision/Assistance - 24 hour     Does the patient have the potential to tolerate intense rehabilitation     Barriers to Discharge        Equipment Recommendations  Rolling walker with 5" wheels (3-in-1)    Recommendations for Other Services    Frequency Min 3X/week   Plan      Precautions / Restrictions Precautions Precautions: Fall Precaution Comments: abdominal binder Required Braces or Orthoses: Other Brace/Splint Other Brace/Splint: abdominal binder Restrictions Weight Bearing Restrictions: No       Mobility  Bed Mobility Bed Mobility: Rolling Right;Right Sidelying to Sit;Sitting - Scoot to Edge of Bed Rolling Right: 4: Min assist;With rail Right Sidelying to Sit: 4: Min assist;HOB flat;With rails Sitting - Scoot to Edge of Bed: 6: Modified independent (Device/Increase time) Details for Bed Mobility Assistance: Cues for sequencing & technique.  (A) to initiate rolling, move LE's over EOB, & to lift shoulders/trunk to sitting upright.   Transfers Sit to Stand: 3: Mod assist;With upper extremity assist;From bed Stand to Sit: 4: Min assist;With upper extremity assist;With armrests;To chair/3-in-1 Details for Transfer Assistance: Cues for hand placement & technique.  (A) to achieve standing, balance, & controlled descent.   Ambulation/Gait Ambulation/Gait Assistance: 1: +2 Total  assist Ambulation/Gait: Patient Percentage: 70% Ambulation Distance (Feet): 60 Feet Assistive device: 2 person hand held assist Ambulation/Gait Assistance Details: Ambulated with bil HHA due to RW unavailable at time.  (A) required for balance & safety.  Pt with very slow gait speed at beginining but she was then able to increase speed as she was heading back into room.   Gait Pattern: Step-through pattern;Decreased stride length;Lateral trunk lean to right;Lateral trunk lean to left Gait velocity: decreased Stairs: No Wheelchair Mobility Wheelchair Mobility: No      PT Goals Acute Rehab PT Goals Time For Goal Achievement: 03/01/13 Potential to Achieve Goals: Good Pt will go Supine/Side to Sit: with modified independence;with HOB 0 degrees PT Goal: Supine/Side to Sit - Progress: Progressing toward goal Pt will go Sit to Stand: with supervision;with upper extremity assist PT Goal: Sit to Stand - Progress: Progressing toward goal Pt will Transfer Bed to Chair/Chair to Bed: with min assist Pt will Ambulate: 51 - 150 feet;with supervision;with least restrictive assistive device PT Goal: Ambulate - Progress: Progressing toward goal Pt will Go Up / Down Stairs: 6-9 stairs;with min assist;with least restrictive assistive device  Visit Information  Last PT Received On: 02/17/13 Assistance Needed: +2 (lines & chair)    Subjective Data      Cognition  Cognition Arousal/Alertness: Awake/alert Behavior During Therapy: WFL for tasks assessed/performed Overall Cognitive Status: Within Functional Limits for tasks assessed    Balance     End of Session PT - End of Session Equipment Utilized During Treatment: Gait belt Activity Tolerance: Patient tolerated treatment well Patient left: in chair;with call bell/phone  within reach;with family/visitor present Nurse Communication: Mobility status     Verdell Face, [name] [phone] 02/17/2013  Agree with above assessment.  Lewis Shock,  PT, DPT Pager #: [phone] Office #: [phone]

## 2013-02-28 NOTE — Progress Notes (Signed)
   ELECTROPHYSIOLOGY ROUNDING NOTE    Patient Name: Betty [name] Date of Encounter: 02/28/2013    SUBJECTIVE:Patient feels better.  She thinks breathing is much improved. Still with lower extremity edema that was not present prior to admission.  She feels as though she is urinating a lot.  Unsure how accurate I/O or daily weights are. She has been more active ambulating and is ready for discharge from surgery perspective.  Still on IV Lasix.   TELEMETRY: Reviewed telemetry pt in sinus rhythm/sinus tach with intermittent atrial pacing Filed Vitals:   02/28/13 0508 02/28/13 0825 02/28/13 0826 02/28/13 1400  BP: 97/61 94/51 96/47  110/86  Pulse: 98 79  99  Temp: 98.4 F (36.9 C) 98 F (36.7 C)  98.5 F (36.9 C)  TempSrc: Oral Oral  Oral  Resp: 18 17  18   Height:      Weight:      SpO2: 100% 100%      Intake/Output Summary (Last 24 hours) at 02/28/13 1528 Last data filed at 02/28/13 0900  Gross per 24 hour  Intake    240 ml  Output    300 ml  Net    -60 ml    CURRENT MEDICATIONS: . antiseptic oral rinse  15 mL Mouth Rinse BID  . enoxaparin (LOVENOX) injection  20 mg Subcutaneous Q24H  . feeding supplement  237 mL Oral TID WC  . furosemide  40 mg Intravenous Daily  . levothyroxine  50 mcg Oral QAC breakfast  . magnesium oxide  400 mg Oral Daily    LABS: Basic Metabolic Panel:  Recent Labs  16/10/96 0534  NA 144  K 3.7  CL 105  CO2 34*  GLUCOSE 106*  BUN 14  CREATININE 0.71  CALCIUM 7.8*  MG 1.5   Radiology/Studies:  Dg Chest 2 View 02/22/2013   *RADIOLOGY REPORT*  Clinical Data: Shortness of breath  CHEST - 2 VIEW  Comparison: 02/15/2013  Findings: Left subclavian sequential pacemaker leads project at right atrium and right ventricle. Enlargement of cardiac silhouette. Calcified tortuous aorta. Pulmonary vascular congestion. Bibasilar pleural effusions and atelectasis. No gross infiltrate or pneumothorax. Bones diffusely demineralized. Question underlying  emphysematous changes.  IMPRESSION: Enlargement of cardiac silhouette with pulmonary vascular congestion. Bibasilar pleural effusions and atelectasis. Question underlying emphysematous changes.   Original Report Authenticated By: Ulyses Southward, [name]    PHYSICAL EXAM elderly appearing woman, NAD HEENT: Unremarkable Neck:  7 cm JVD, no thyromegally Lungs:  Clear with no wheezes HEART:  Regular rate rhythm, no murmurs, no rubs, no clicks Abd:  soft, positive bowel sounds, no organomegally, no rebound, no guarding Ext:  2 plus pulses, no edema, no cyanosis, no clubbing Skin:  No rashes no nodules Neuro:  CN II through XII intact, motor grossly intact     Active Problems:   Pacemaker-[employer]   Mobitz (type) II atrioventricular block   Incarcerated hernia   Acute respiratory failure   Acute encephalopathy   Hyperglycemia   Protein-calorie malnutrition, severe   Septic shock(785.52)   Hypernatremia   Volume overload   Acute diastolic heart failure   Rec: while she has some peripheral edema, she appears back to baseline. I suspect her edema will improve once her nutritional stores are better. Ok for discharge tomorrow if no problems tonight. She has clear lungs and no JVD to speak of. Would dc on oral lasix 40 mg daily.   Leonia Reeves.D.

## 2013-02-28 NOTE — Progress Notes (Signed)
Occupational Therapy Treatment Patient Details Name: Ross Hefferan MRN: [id number] DOB: Nov 23, 1914 Today's Date: 02/28/2013 Time: 4098-1191 OT Time Calculation (min): 35 min  OT Assessment / Plan / Recommendation  OT comments  Pt improving.  She is able to tolerate increased activity today with rest breaks, and is now at min A level for functional transfers.  She is eager to return home with family assistance.  Did discuss option of CIR as she would benefit, but she would rather go home with [facility] services.    Follow Up Recommendations  Home health OT;Supervision/Assistance - 24 hour    Barriers to Discharge       Equipment Recommendations  3 in 1 bedside comode;Tub/shower bench;Wheelchair (measurements OT) (16')    Recommendations for Other Services    Frequency Min 2X/week   Progress towards OT Goals Progress towards OT goals: Progressing toward goals  Plan Discharge plan remains appropriate    Precautions / Restrictions Precautions Precautions: Fall Required Braces or Orthoses: Other Brace/Splint Other Brace/Splint: abdominal binder Restrictions Weight Bearing Restrictions: No       ADL  Lower Body Dressing: Moderate assistance Where Assessed - Lower Body Dressing: Supported sit to Pharmacist, hospital: Minimal assistance Toilet Transfer Method: Sit to stand;Stand pivot Acupuncturist: Bedside commode Toileting - Clothing Manipulation and Hygiene: Minimal assistance Where Assessed - Engineer, mining and Hygiene: Standing Equipment Used: Rolling walker Transfers/Ambulation Related to ADLs: Min A. requires cues for hand placement ADL Comments: Pt requires assist to cross ankles over knees, but is able to access feet to doff sock.  Needs assist to pull sock over toes due to fear of pain.  Pt fatigues quickly - requires 2 lengthy rest breaks during session.  HR 107    OT Diagnosis:    OT Problem List:   OT Treatment Interventions:     OT  Goals(current goals can now be found in the care plan section)    Visit Information  Last OT Received On: 02/28/13 Assistance Needed: +1 PT/OT Co-Evaluation/Treatment: Yes    Subjective Data      Prior Functioning       Cognition  Cognition Arousal/Alertness: Awake/alert Behavior During Therapy: WFL for tasks assessed/performed Overall Cognitive Status: Within Functional Limits for tasks assessed    Mobility  Bed Mobility Bed Mobility: Not assessed Transfers Transfers: Sit to Stand;Stand to Sit Sit to Stand: 4: Min assist;With upper extremity assist;From chair/3-in-1 Stand to Sit: 4: Min assist;With upper extremity assist;To chair/3-in-1 Details for Transfer Assistance: cues for hand placement.  Min A for forward excursion of trunk    Exercises      Balance Balance Balance Assessed: Yes Dynamic Standing Balance Dynamic Standing - Balance Support: No upper extremity supported Dynamic Standing - Level of Assistance: 4: Min assist Dynamic Standing - Balance Activities: Other (comment) (simulated pulling up pants)   End of Session OT - End of Session Equipment Utilized During Treatment: Rolling walker Activity Tolerance: Patient tolerated treatment well Patient left: in chair;with call bell/phone within reach;with family/visitor present  GO     Mulan Adan M 02/28/2013, 11:20 AM

## 2013-02-28 NOTE — Progress Notes (Signed)
Physical Therapy Treatment Patient Details Name: Betty [name] MRN: [id number] DOB: July 29, 1915 Today's Date: 02/21/2013 Time: 4098-1191 PT Time Calculation (min): 38 min  PT Assessment / Plan / Recommendation Comments on Treatment Session  Pt appeared much more fatigued + weak today.  Required increased assistance for bed mobility, transfers, & only able to ambulate ~5' today.  Due to decline in mobility in today's session, pt would best benefit from ST-SNF to maximize functional recovery, strength, & independence with mobility.  d/c plans updated.  Spoke with case manager regarding d/c recommendation at this date & shes states she will speak with pt's daughter.      Follow Up Recommendations  SNF;Home health PT;Supervision/Assistance - 24 hour     Does the patient have the potential to tolerate intense rehabilitation     Barriers to Discharge        Equipment Recommendations  Rolling walker with 5" wheels    Recommendations for Other Services    Frequency Min 3X/week   Plan Discharge plan needs to be updated;Frequency remains appropriate    Precautions / Restrictions Precautions Precautions: Fall Precaution Comments: abdominal binder Required Braces or Orthoses: Other Brace/Splint Other Brace/Splint: abdominal binder Restrictions Weight Bearing Restrictions: No       Mobility  Bed Mobility Bed Mobility: Rolling Right;Right Sidelying to Sit;Sitting - Scoot to Edge of Bed Rolling Right: 3: Mod assist;With rail Right Sidelying to Sit: 3: Mod assist;HOB flat;With rails Supine to Sit: 2: Max assist Sitting - Scoot to Edge of Bed: 3: Mod assist Details for Bed Mobility Assistance: Pt required increased assist today with transitional movements.  Encouragement to perform as independently as possible but pt very quick to reach for therapist for assistance & to pull self to sitting upright.   Transfers Transfers: Sit to Stand;Stand to [employer] Transfers Sit to Stand: 3: Mod  assist;With upper extremity assist;With armrests;From bed;From chair/3-in-1 Stand to Sit: 4: Min assist;With upper extremity assist;With armrests;To bed;To chair/3-in-1 Stand Pivot Transfers: 4: Min assist Details for Transfer Assistance: Cues for hand placement & technique.  Increased assist to achieve standing today compared to last PT session. (A) to achieve standing, translate balance forwards over BOS, & controlled descent.   Ambulation/Gait Ambulation/Gait Assistance: 3: Mod assist Ambulation Distance (Feet): 5 Feet Assistive device: Rolling walker Ambulation/Gait Assistance Details: Pt limited by fatigue + weakness today.  ambulates with flexed posture & shuffling steps.   Gait Pattern: Trunk flexed;Shuffle Gait velocity: decreased General Gait Details: Pt appeared very weak today & unable to tolerate activity well.   Stairs: No Wheelchair Mobility Wheelchair Mobility: No     PT Goals Acute Rehab PT Goals Time For Goal Achievement: 03/01/13 Potential to Achieve Goals: Good Pt will go Supine/Side to Sit: with modified independence;with HOB 0 degrees PT Goal: Supine/Side to Sit - Progress: Not progressing Pt will go Sit to Stand: with supervision;with upper extremity assist PT Goal: Sit to Stand - Progress: Not progressing Pt will Transfer Bed to Chair/Chair to Bed: with min assist Pt will Ambulate: 51 - 150 feet;with supervision;with least restrictive assistive device PT Goal: Ambulate - Progress: Not progressing Pt will Go Up / Down Stairs: 6-9 stairs;with min assist;with least restrictive assistive device  Visit Information  Last PT Received On: 02/21/13 Assistance Needed: +1    Subjective Data      Cognition  Cognition Arousal/Alertness: Awake/alert Behavior During Therapy: [facility] for tasks assessed/performed    Balance     End of Session PT - End of Session  Equipment Utilized During Treatment: Gait belt Activity Tolerance: Patient limited by fatigue Patient left:  in bed;with call bell/phone within reach;with family/visitor present Nurse Communication: Mobility status    Verdell Face, [name] [phone] 02/21/2013  Agree with above assessment.  Lewis Shock, PT, DPT Pager #: [phone] Office #: [phone]

## 2013-02-28 NOTE — Progress Notes (Signed)
NUTRITION FOLLOW UP  Intervention:   1.  Enteral nutrition; RD remains concerned about pt's ability to consume adequate calorie and protein to support healing, however there is progress seen over the past week.  Family also reports their belief that intake will improve as pt is able to return home with foods she prefers.   PO intake is not sufficient to meet 100% estimated needs at this time, however with progress and pt's willingness to eat, could envision pt meeting needs PO.  Pt will require maximum encouragement to eat as daughter reports that complains of early satiety during the day when TFs are held. Will discuss with team.  2.  General healthful diet; encourage intake of meals and supplements.  Encourage protein. MD please include Ensure (or nutrition supplement of choice) BID on home medication list.   Nutrition Dx:   Inadequate oral intake now r/t to post-op ileus.  Ongoing.   Goal:   Initiate nutrition support if TFs at discharge appropriate Improvement in PO intake.   Monitor:   Nutrition support initiation, respiratory status, weight, labs, I/O's  Assessment:   Pt has been able to advance diet to Regular with tolerance.  Her appetite and intake are improving per daughter.    Pt is tolerating Osmolite 1.2 @ 35 ml/hr overnight which provides 504 kcal, 31g protein, 322 mL free water in addition to PO intake.     Discussed nutrition care plan with PA who is interested in weaning from TFs.  Pt continues to c/o altered taste and poor appetite, however PO intake is improving very slowly.  Observed tray in room with bites taken of oatmeal and eggs, pt ate a strip of bacon.  Ensure at bedside is untouched.   Pt found to meet criteria for severe malnutrition in the context of chronic illness given severe muscle loss (temples, clavicles, shoulders) and severe subcutaneous fat loss (biceps/triceps, below the eyes).  Height: Ht Readings from Last 1 Encounters:  02/13/13 5' (1.524 m)     Weight Status:   Wt Readings from Last 1 Encounters:  02/26/13 128 lb 1.4 oz (58.1 kg)  Wt 91 lbs on admission.   Re-estimated needs:  Kcal: 1400-1600 Protein: 65-75g Fluid: >1.5 L/day  Skin: incisions, pt with peripheral edema  Diet Order: General   Intake/Output Summary (Last 24 hours) at 02/28/13 1044 Last data filed at 02/27/13 1500  Gross per 24 hour  Intake      0 ml  Output   1000 ml  Net  -1000 ml    Last BM: 6/28   Labs:   Recent Labs Lab 02/24/13 0555 02/24/13 1125 02/25/13 1210 02/27/13 0534  NA 143 144 145 144  K 3.8 3.8 3.7 3.7  CL 111 109 108 105  CO2 25 28 29  34*  BUN 9 10 11 14   CREATININE 0.61 0.66 0.67 0.71  CALCIUM 7.8* 7.6* 7.9* 7.8*  MG 1.4*  --   --  1.5  GLUCOSE 119* 97 96 106*    CBG (last 3)   Recent Labs  02/27/13 2322 02/28/13 0410 02/28/13 0809  GLUCAP 105* 107* 116*    Scheduled Meds: . antiseptic oral rinse  15 mL Mouth Rinse BID  . enoxaparin (LOVENOX) injection  20 mg Subcutaneous Q24H  . feeding supplement  237 mL Oral TID WC  . furosemide  40 mg Intravenous Daily  . levothyroxine  50 mcg Oral QAC breakfast  . magnesium oxide  400 mg Oral Daily    Continuous  Infusions: . feeding supplement (OSMOLITE 1.2 CAL) 1,000 mL (02/27/13 1901)    Loyce Dys, MS RD LDN Clinical Inpatient Dietitian Pager: [phone] Weekend/After hours pager: [phone]

## 2013-02-28 NOTE — Progress Notes (Signed)
Awaiting resolution of medical issues prior to discharge. Surgical issues stable.  Wilmon Arms. Corliss Skains, MD, [facility] Surgery  General/ Trauma Surgery  02/28/2013 5:33 PM

## 2013-03-01 ENCOUNTER — Telehealth (INDEPENDENT_AMBULATORY_CARE_PROVIDER_SITE_OTHER): Payer: Self-pay

## 2013-03-01 LAB — GLUCOSE, CAPILLARY: Glucose-Capillary: 133 mg/dL — ABNORMAL HIGH (ref 70–99)

## 2013-03-01 MED ORDER — LEVOTHYROXINE SODIUM 50 MCG PO TABS: 50.0000 ug | ORAL_TABLET | Freq: Every day | ORAL | Status: DC

## 2013-03-01 MED ORDER — ENSURE COMPLETE PO LIQD: 237.0000 mL | Freq: Three times a day (TID) | ORAL | Status: DC

## 2013-03-01 MED ORDER — FUROSEMIDE 40 MG PO TABS: 40.0000 mg | ORAL_TABLET | Freq: Every day | ORAL | Status: DC

## 2013-03-01 NOTE — Telephone Encounter (Signed)
Called patient; no answer or VM available. Post op appointment made for 7/18 @ 10:00am with Dr Luisa Hart.

## 2013-03-01 NOTE — Discharge Summary (Signed)
Physician Discharge Summary  Patient ID: Betty [name] MRN: [id number] DOB/AGE: February 08, 1915 79 y.o.  Admit date: 02/08/2013 Discharge date: 03/01/2013  Admitting Diagnosis: Incarcerated hernia (periumbilical hernia) Incarcerated right obturator hernia Left femoral hernia SBO Sepsis Protein calorie malnutrition   Discharge Diagnosis Patient Active Problem List   Diagnosis Date Noted  . Volume overload 02/23/2013  . Acute diastolic heart failure 02/23/2013  . Hypernatremia 02/15/2013  . Septic shock(785.52) 02/11/2013  . Intestinal ischemia 02/10/2013  . Incarcerated hernia 02/09/2013  . Acute respiratory failure 02/09/2013  . Acute encephalopathy 02/09/2013  . Hyperglycemia 02/09/2013  . Protein-calorie malnutrition, severe 02/09/2013  . Mobitz (type) II atrioventricular block 06/29/2012  . Pacemaker-Medtronic 03/19/2012    Consultants Cardiology (Dr. Vertis Kelch al.)  Imaging: No results found.  Procedures Dr Luisa Hart (02/09/13) Ex lap with hernia repair Dr. Magnus Ivan (02/10/13) Ex lap, small bowel stricturoplasty and abdominal closure  Hospital Course:  79 yo with past medical history of AV block s/p pacemaker placement brought to [facility] via [facility] due to abdominal pain and constipation.  She was admitted with SBO secondary to incarcerated obturator, femoral, and ventral hernias. Underwent exploratory laparotomy with hernia repair on 02/09/13 by Dr. Luisa Hart.  Brought to 2300 unit with open abdomen, intubated. PCCM was consulted.  She was taken back to the OR on 02/10/13 for ex lap, small bowel stricturoplasty and abdominal closure.    She experienced post op acute respiratory and renal failure, acute encephalopathy, sepsis, electrolyte imbalances, complete AV block with PTVP, blood loss anemia on top of chronic disease anemia, and a post operative ileus.  She was successfully weaned from vasopressor support/sedation and extubated on 02/12/13.  PT/OT were consulted to help her  mobilize who ultimately recommended [facility] PT/OT.  SLP consulted and cleared her for PO.  Her post op ileus slowly resolved starting on 6/17-18.  Diet was advanced as tolerated via PO and tube feeds.  However, after advancing her diet she developed N/V so she had to be placed on NPO and held her TF's.  We re-advanced her diet with good result.  She slowly started to take more PO intake.  On 6/23 (POD #14-15) she was noted to have a lot of peripheral edema and SOB.  An echo, Ecg, cardiac enzymes was obtained and a cardiology consult.  She was noted to have acute volume overload and was started on Lasix and had good result over the next week.  On POD #21, the patient was voiding well, tolerating diet, ambulating well, pain well controlled, vital signs stable, incisions c/d/i and felt stable for discharge home.  Patient will follow up in our office in 2 weeks and knows to call with questions or concerns.  Physical Exam: General:  Alert, NAD, pleasant, comfortable Abd:  Soft, ND, mild tenderness, incisions C/D/I, gtube site clean, functioning properly    Medication List         feeding supplement Liqd  Take 237 mLs by mouth 3 (three) times daily with meals.     furosemide 40 MG tablet  Commonly known as:  LASIX  Take 1 tablet (40 mg total) by mouth daily.     levothyroxine 50 MCG tablet  Commonly known as:  SYNTHROID, LEVOTHROID  Take 1 tablet (50 mcg total) by mouth daily before breakfast.     vitamin C 500 MG tablet  Commonly known as:  ASCORBIC ACID  Take 500 mg by mouth daily.             Follow-up Information  Follow up with Dortha Schwalbe., MD.   Contact information:   [address] [place] [address] [phone]       Follow up with Lewayne Bunting, MD On 04/05/2013. (APPT AT 9:15AM, PLEASE ARRIVE 15-20MIN EARLY FOR CHECK IN)    Contact information:   [address] [place] [address] [phone]       Signed: [facility] [phone]  03/01/2013, 11:37 AM

## 2013-03-01 NOTE — Discharge Summary (Signed)
Wilmon Arms. Corliss Skains, MD, [facility] Surgery  General/ Trauma Surgery  03/01/2013 1:00 PM

## 2013-03-02 ENCOUNTER — Telehealth: Payer: Self-pay | Admitting: Internal Medicine

## 2013-03-02 NOTE — Telephone Encounter (Signed)
New Problem  Betty [name] wants to know if this patient can get a home health aide while they are out providing services to her.

## 2013-03-02 NOTE — Telephone Encounter (Signed)
Spoke with [facility] will ask Dr Ladona Ridgel tomorrow and let her know

## 2013-03-03 NOTE — Telephone Encounter (Signed)
Spoke with the [facility] she has put the orders in with the surgeons

## 2013-03-18 ENCOUNTER — Encounter (INDEPENDENT_AMBULATORY_CARE_PROVIDER_SITE_OTHER): Payer: Self-pay | Admitting: Surgery

## 2013-03-18 ENCOUNTER — Telehealth (INDEPENDENT_AMBULATORY_CARE_PROVIDER_SITE_OTHER): Payer: Self-pay

## 2013-03-18 ENCOUNTER — Ambulatory Visit (INDEPENDENT_AMBULATORY_CARE_PROVIDER_SITE_OTHER): Payer: Medicare Other | Admitting: Surgery

## 2013-03-18 VITALS — BP 118/58 | HR 80 | Temp 99.1°F | Resp 15 | Ht 61.0 in | Wt 80.0 lb

## 2013-03-18 DIAGNOSIS — Z9889 Other specified postprocedural states: Secondary | ICD-10-CM

## 2013-03-18 NOTE — Telephone Encounter (Signed)
I called Reginald back. He states independent living will be requesting records to get a wheelchair ramp approved for patient. He says they need office notes and diagnoses code before it will get approved. I told him to send request over by fax and before Dr Luisa Hart signs off on one will make sure correct dx code is on it.

## 2013-03-18 NOTE — Patient Instructions (Signed)
Return to clinic one month. Patient is now wheelchair dependent. She cannot ambulate by herself and requires a wheelchair to move from room to room and to leave for home to get to all of her appointments. She is training on the walker but is not yet stable by herself to transition to a walker alone. Patient will require handicap access to her home at this point in time. Continue to use feeding tube is needed. Encourage oral intake. Hopefully remove feeding tube in one month.

## 2013-03-18 NOTE — Progress Notes (Signed)
Patient returns after exploratory laparotomy for incarcerated obturator hernia on the right, left femoral hernia and umbilical hernia one month ago. She is slowly recovering at home and is improving. Gastrostomy tube was placed for feeding access. Denies any pain. She is wheelchair dependent due to severe deconditioning but is trying to slowly transition to walk or.  Exam: Patient looks well and is in wheelchair. Midline incision is closed. G-tube site clean. No evidence of recurrent left femoral hernia. Abdomen soft nontender.  Impression: Status post exploratory laparotomy for incarcerated right obturator hernia causing bowel obstruction  Severe deconditioning  Protein calorie malnutrition  Plan: Patient will require long-term handicap access to her home. She is wheelchair dependent and will be for the future. She is trying to walk with a walker but is not yet at a point where it is safe on her own. Therefore, she will require a ramp for wheelchair access to her home. Return to clinic one month. Removed G-tube at that point in time.

## 2013-03-18 NOTE — Telephone Encounter (Signed)
Message copied by Brennan Bailey on Fri Mar 18, 2013  2:07 PM ------      Message from: Marin Shutter      Created: Fri Mar 18, 2013 12:37 PM      Regarding: Dr. Tommas Olp Bristoe [phone]. Would like a sheet with diagnosis codes on it.  Pt would need a wheelchair ramp. Thx ------

## 2013-03-23 NOTE — Telephone Encounter (Signed)
Tiburcio Bash calling again to confirm receipt of fax request for wheelchair ramp.  He was told the message would be passed on to [facility], Dr. Rosezena Sensor nurse, but that no action would be taken until Dr. Luisa Hart returns to clinic.  He understood.

## 2013-03-25 NOTE — Telephone Encounter (Signed)
I called Betty [name] back and he states everything from medical records was sent in correctly and has been handled. He thanked Dr Luisa Hart and I for all the help.

## 2013-03-31 ENCOUNTER — Other Ambulatory Visit (HOSPITAL_COMMUNITY): Payer: Self-pay | Admitting: General Surgery

## 2013-03-31 NOTE — Telephone Encounter (Signed)
Refill needs to be approved by PCP

## 2013-03-31 NOTE — Telephone Encounter (Signed)
This is a patient of [facility]...please advise

## 2013-04-04 ENCOUNTER — Encounter: Payer: Self-pay | Admitting: *Deleted

## 2013-04-05 ENCOUNTER — Encounter: Payer: Self-pay | Admitting: Internal Medicine

## 2013-04-05 ENCOUNTER — Ambulatory Visit (INDEPENDENT_AMBULATORY_CARE_PROVIDER_SITE_OTHER): Payer: Medicare Other | Admitting: Internal Medicine

## 2013-04-05 VITALS — BP 104/63 | HR 75 | Ht 60.0 in | Wt 87.4 lb

## 2013-04-05 DIAGNOSIS — Z950 Presence of cardiac pacemaker: Secondary | ICD-10-CM

## 2013-04-05 DIAGNOSIS — I441 Atrioventricular block, second degree: Secondary | ICD-10-CM

## 2013-04-05 DIAGNOSIS — I5031 Acute diastolic (congestive) heart failure: Secondary | ICD-10-CM

## 2013-04-05 LAB — PACEMAKER DEVICE OBSERVATION
AL IMPEDENCE PM: 384 Ohm
AL THRESHOLD: 0.5 V
ATRIAL PACING PM: 12
BATTERY VOLTAGE: 2.81 V
RV LEAD IMPEDENCE PM: 453 Ohm
VENTRICULAR PACING PM: 0.3

## 2013-04-05 NOTE — Assessment & Plan Note (Signed)
Her Medtronic dual-chamber pacemaker is working normally. We'll plan to recheck in several months. 

## 2013-04-05 NOTE — Patient Instructions (Signed)
Your physician wants you to follow-up in: 12 months with Dr. Taylor. You will receive a reminder letter in the mail two months in advance. If you don't receive a letter, please call our office to schedule the follow-up appointment.    

## 2013-04-05 NOTE — Progress Notes (Signed)
HPI Betty [name] returns today for followup. She is a very pleasant 79 year old woman with a history of complete heart block, status post permanent pacemaker insertion. In the interim, she has done well. She denies chest pain, shortness of breath, or syncope. No peripheral edema. No Known Allergies   Current Outpatient Prescriptions  Medication Sig Dispense Refill  . feeding supplement (ENSURE COMPLETE) LIQD Take 237 mLs by mouth 3 (three) times daily with meals.  21 Bottle  10  . furosemide (LASIX) 40 MG tablet Take 1 tablet (40 mg total) by mouth daily.  30 tablet  1  . levothyroxine (SYNTHROID, LEVOTHROID) 50 MCG tablet Take 1 tablet (50 mcg total) by mouth daily before breakfast.  30 tablet  1  . vitamin C (ASCORBIC ACID) 500 MG tablet Take 500 mg by mouth as needed.        No current facility-administered medications for this visit.     Past Medical History  Diagnosis Date  . GERD (gastroesophageal reflux disease)   . Complete AV block 03/18/12  . BBB (bundle branch block) 03/18/12    left  . Pacemaker   . Refusal of blood transfusions as patient is Jehovah's Witness     ROS:   All systems reviewed and negative except as noted in the HPI.   Past Surgical History  Procedure Laterality Date  . No past surgeries    . Pacemaker insertion  03/18/12    initial placement  . Laparotomy N/A 02/09/2013    Procedure: Exploratory Laparotomy; Repair of umbilical hernia; repair of obturator hernia; Placement of wound vac; placement of gastrostomy tube;  Surgeon: [name], MD;  Location: [facility] OR;  Service: General;  Laterality: N/A;  . Colon resection N/A 02/10/2013    Procedure: EXPLORATORY LAPAROTOMY, CLOSURE OF ABDOMEN;  Surgeon: [name], MD;  Location: [facility] OR;  Service: General;  Laterality: N/A;     Family History  Problem Relation Age of Onset  . Cancer Sister     breast     History   Social History  . Marital Status: Widowed    Spouse Name: N/A    Number  of Children: N/A  . Years of Education: N/A   Occupational History  . Not on file.   Social History Main Topics  . Smoking status: Never Smoker   . Smokeless tobacco: Never Used  . Alcohol Use: No  . Drug Use: No  . Sexually Active: No   Other Topics Concern  . Not on file   Social History Narrative  . No narrative on file     BP 104/63  Pulse 75  Ht 5' (1.524 m)  Wt 87 lb 6.4 oz (39.644 kg)  BMI 17.07 kg/m2  Physical Exam:  Well appearing elderly woman, NAD HEENT: Unremarkable Neck:  6 cm JVD, no thyromegally Lungs:  Clear with no wheezes, rales, or rhonchi. Well-healed pacemaker incision. HEART:  Regular rate rhythm, no murmurs, no rubs, no clicks Abd:  soft, positive bowel sounds, no organomegally, no rebound, no guarding Ext:  2 plus pulses, no edema, no cyanosis, no clubbing Skin:  No rashes no nodules Neuro:  CN II through XII intact, motor grossly intact   DEVICE  Normal device function.  See PaceArt for details.   Assess/Plan:

## 2013-04-05 NOTE — Assessment & Plan Note (Signed)
Her chronic diastolic heart failure is currently class II. No change in medical therapy. I've encouraged the patient to avoid salt in her diet.

## 2013-04-11 ENCOUNTER — Ambulatory Visit (INDEPENDENT_AMBULATORY_CARE_PROVIDER_SITE_OTHER): Payer: Medicare Other | Admitting: Surgery

## 2013-04-11 ENCOUNTER — Encounter (INDEPENDENT_AMBULATORY_CARE_PROVIDER_SITE_OTHER): Payer: Self-pay | Admitting: Surgery

## 2013-04-11 VITALS — BP 90/54 | HR 64 | Temp 97.1°F | Resp 14 | Ht 61.0 in | Wt 85.8 lb

## 2013-04-11 DIAGNOSIS — Z9889 Other specified postprocedural states: Secondary | ICD-10-CM

## 2013-04-11 MED ORDER — LACTULOSE 10 G PO PACK: 10.0000 g | PACK | Freq: Every day | ORAL | Status: DC

## 2013-04-11 NOTE — Progress Notes (Signed)
Patient returns after exploratory laparotomy for incarcerated obturator hernia on the right, left femoral hernia and umbilical hernia one month ago. She is slowly recovering at home and is improving. Gastrostomy tube was placed for feeding access. Denies any pain. She is wheelchair dependent due to severe deconditioning but is trying to slowly transition to walk or.  Exam: Patient looks well and is in wheelchair. Midline incision is closed. G-tube site clean. No evidence of recurrent left femoral hernia. Abdomen soft nontender.  Impression: Status post exploratory laparotomy for incarcerated right obturator hernia causing bowel obstruction  Severe deconditioning  Protein calorie malnutrition  Plan: patient wants  to keep her G-tube for now. She is eating better. Recommend MiraLAX or lactulose for constipation. Return one month. Possible discontinuation NG tube at that time.

## 2013-04-11 NOTE — Patient Instructions (Signed)
Return 1 month.  Miralax for constipation.

## 2013-04-28 ENCOUNTER — Encounter (INDEPENDENT_AMBULATORY_CARE_PROVIDER_SITE_OTHER): Payer: Medicare Other | Admitting: Surgery

## 2013-05-09 ENCOUNTER — Ambulatory Visit (INDEPENDENT_AMBULATORY_CARE_PROVIDER_SITE_OTHER): Payer: Medicare Other | Admitting: Surgery

## 2013-05-09 ENCOUNTER — Encounter (INDEPENDENT_AMBULATORY_CARE_PROVIDER_SITE_OTHER): Payer: Self-pay | Admitting: Surgery

## 2013-05-09 VITALS — BP 100/68 | HR 64 | Temp 98.4°F | Resp 14 | Ht 63.0 in | Wt 89.2 lb

## 2013-05-09 DIAGNOSIS — Z9889 Other specified postprocedural states: Secondary | ICD-10-CM

## 2013-05-09 NOTE — Progress Notes (Signed)
Patient returns after exploratory laparotomy for incarcerated obturator hernia on the right, left femoral hernia and umbilical hernia 3  months ago. She is slowly recovering at home and is improving. Gastrostomy tube was placed for feeding access. Denies any pain. She is wheelchair dependent due to severe deconditioning but is trying to slowly transition to walk or.  Exam: Patient looks well and is in wheelchair. Midline incision is closed. G-tube site clean. Removed GT No evidence of recurrent left femoral hernia. Abdomen soft nontender.  Impression: Status post exploratory laparotomy for incarcerated right obturator hernia causing bowel obstruction  Severe deconditioning  Protein calorie malnutrition  Plan:  She is eating better. Recommend MiraLAX or lactulose for constipation. Return AS NEEDED

## 2013-05-09 NOTE — Patient Instructions (Signed)
Apply neosporin to G Tube site. Return as needed.

## 2014-04-17 ENCOUNTER — Ambulatory Visit (INDEPENDENT_AMBULATORY_CARE_PROVIDER_SITE_OTHER): Payer: Medicare Other | Admitting: Internal Medicine

## 2014-04-17 ENCOUNTER — Encounter: Payer: Self-pay | Admitting: Internal Medicine

## 2014-04-17 VITALS — BP 121/65 | HR 78 | Ht 61.0 in | Wt 94.0 lb

## 2014-04-17 DIAGNOSIS — Z950 Presence of cardiac pacemaker: Secondary | ICD-10-CM

## 2014-04-17 DIAGNOSIS — I441 Atrioventricular block, second degree: Secondary | ICD-10-CM

## 2014-04-17 DIAGNOSIS — I5031 Acute diastolic (congestive) heart failure: Secondary | ICD-10-CM

## 2014-04-17 LAB — MDC_IDC_ENUM_SESS_TYPE_INCLINIC
Brady Statistic AP VP Percent: 0 %
Brady Statistic AP VS Percent: 16 %
Brady Statistic AS VP Percent: 0 %
Date Time Interrogation Session: 20150817120026
Lead Channel Impedance Value: 365 Ohm
Lead Channel Pacing Threshold Amplitude: 0.5 V
Lead Channel Pacing Threshold Amplitude: 0.5 V
Lead Channel Pacing Threshold Pulse Width: 0.4 ms
Lead Channel Sensing Intrinsic Amplitude: 2.8 mV
Lead Channel Sensing Intrinsic Amplitude: 22.4 mV
MDC IDC MSMT BATTERY IMPEDANCE: 172 Ohm
MDC IDC MSMT BATTERY REMAINING LONGEVITY: 127 mo
MDC IDC MSMT BATTERY VOLTAGE: 2.79 V
MDC IDC MSMT LEADCHNL RA IMPEDANCE VALUE: 334 Ohm
MDC IDC MSMT LEADCHNL RV PACING THRESHOLD PULSEWIDTH: 0.4 ms
MDC IDC SET LEADCHNL RA PACING AMPLITUDE: 2 V
MDC IDC SET LEADCHNL RV PACING AMPLITUDE: 2.5 V
MDC IDC SET LEADCHNL RV PACING PULSEWIDTH: 0.4 ms
MDC IDC SET LEADCHNL RV SENSING SENSITIVITY: 5.6 mV
MDC IDC STAT BRADY AS VS PERCENT: 83 %

## 2014-04-17 NOTE — Progress Notes (Signed)
HPI Ms. Kleeman returns today for followup. She is a very pleasant 80 year old woman with a history of complete heart block, status post permanent pacemaker insertion. In the interim, she has done well. She denies chest pain, shortness of breath, or syncope. No peripheral edema. Amazingly, she takes no medications.  No Known Allergies   Current Outpatient Prescriptions  Medication Sig Dispense Refill  . feeding supplement (ENSURE COMPLETE) LIQD Take 237 mLs by mouth 3 (three) times daily with meals.  21 Bottle  10   No current facility-administered medications for this visit.     Past Medical History  Diagnosis Date  . GERD (gastroesophageal reflux disease)   . Complete AV block 03/18/12  . BBB (bundle branch block) 03/18/12    left  . Pacemaker   . Refusal of blood transfusions as patient is Jehovah's Witness     ROS:   All systems reviewed and negative except as noted in the HPI.   Past Surgical History  Procedure Laterality Date  . No past surgeries    . Pacemaker insertion  03/18/12    initial placement  . Laparotomy N/A 02/09/2013    Procedure: Exploratory Laparotomy; Repair of umbilical hernia; repair of obturator hernia; Placement of wound vac; placement of gastrostomy tube;  Surgeon: Clovis Puhomas A. Cornett, MD;  Location: [facility] OR;  Service: General;  Laterality: N/A;  . Colon resection N/A 02/10/2013    Procedure: EXPLORATORY LAPAROTOMY, CLOSURE OF ABDOMEN;  Surgeon: Shelly Rubensteinouglas A Blackman, MD;  Location: [facility] OR;  Service: General;  Laterality: N/A;  . Hernia repair       Family History  Problem Relation Age of Onset  . Cancer Sister     breast     History   Social History  . Marital Status: Widowed    Spouse Name: N/A    Number of Children: N/A  . Years of Education: N/A   Occupational History  . Not on file.   Social History Main Topics  . Smoking status: Never Smoker   . Smokeless tobacco: Never Used  . Alcohol Use: No  . Drug Use: No  . Sexual Activity: No    Other Topics Concern  . Not on file   Social History Narrative  . No narrative on file     BP 121/65  Pulse 78  Ht 5\' 1"  (1.549 m)  Wt 94 lb (42.638 kg)  BMI 17.77 kg/m2  Physical Exam:  Frail, but otherwise well appearing elderly woman, NAD HEENT: Unremarkable Neck:  6 cm JVD, no thyromegally Lungs:  Clear with no wheezes, rales, or rhonchi. Well-healed pacemaker incision. HEART:  Regular rate rhythm, no murmurs, no rubs, no clicks Abd:  soft, positive bowel sounds, no organomegally, no rebound, no guarding Ext:  2 plus pulses, no edema, no cyanosis, no clubbing Skin:  No rashes no nodules Neuro:  CN II through XII intact, motor grossly intact   DEVICE  Normal device function.  See PaceArt for details.   Assess/Plan:

## 2014-04-17 NOTE — Assessment & Plan Note (Signed)
Her Medtronic dual-chamber pacemaker is working normally. We'll plan to recheck in several months. 

## 2014-04-17 NOTE — Patient Instructions (Addendum)
Your physician recommends that you schedule a follow-up appointment in: 6 MONTHS for device check  Your physician wants you to follow-up in: 1 YEAR with Dr Ladona Ridgelaylor. You will receive a reminder letter in the mail two months in advance. If you don't receive a letter, please call our office to schedule the follow-up appointment.

## 2014-04-17 NOTE — Assessment & Plan Note (Signed)
Her symptoms are currently class I. I've asked the patient maintain a low-sodium diet.

## 2014-06-29 ENCOUNTER — Telehealth: Payer: Self-pay | Admitting: Cardiology

## 2014-06-29 NOTE — Telephone Encounter (Signed)
Attempted to call pt and confirm physical address so I can order Medtronic home monitor for pt no answer and unable to leave a message.

## 2014-08-10 ENCOUNTER — Encounter (HOSPITAL_COMMUNITY): Payer: Self-pay | Admitting: Internal Medicine

## 2014-09-14 IMAGING — CR DG CHEST 1V PORT
1 series · 1 of 1 positions shown · non-contrast
Comparison: 02/09/2013; 02/08/2013; 03/19/2012

CLINICAL DATA: Post central line placement

PORTABLE CHEST - 1 VIEW

[AP]
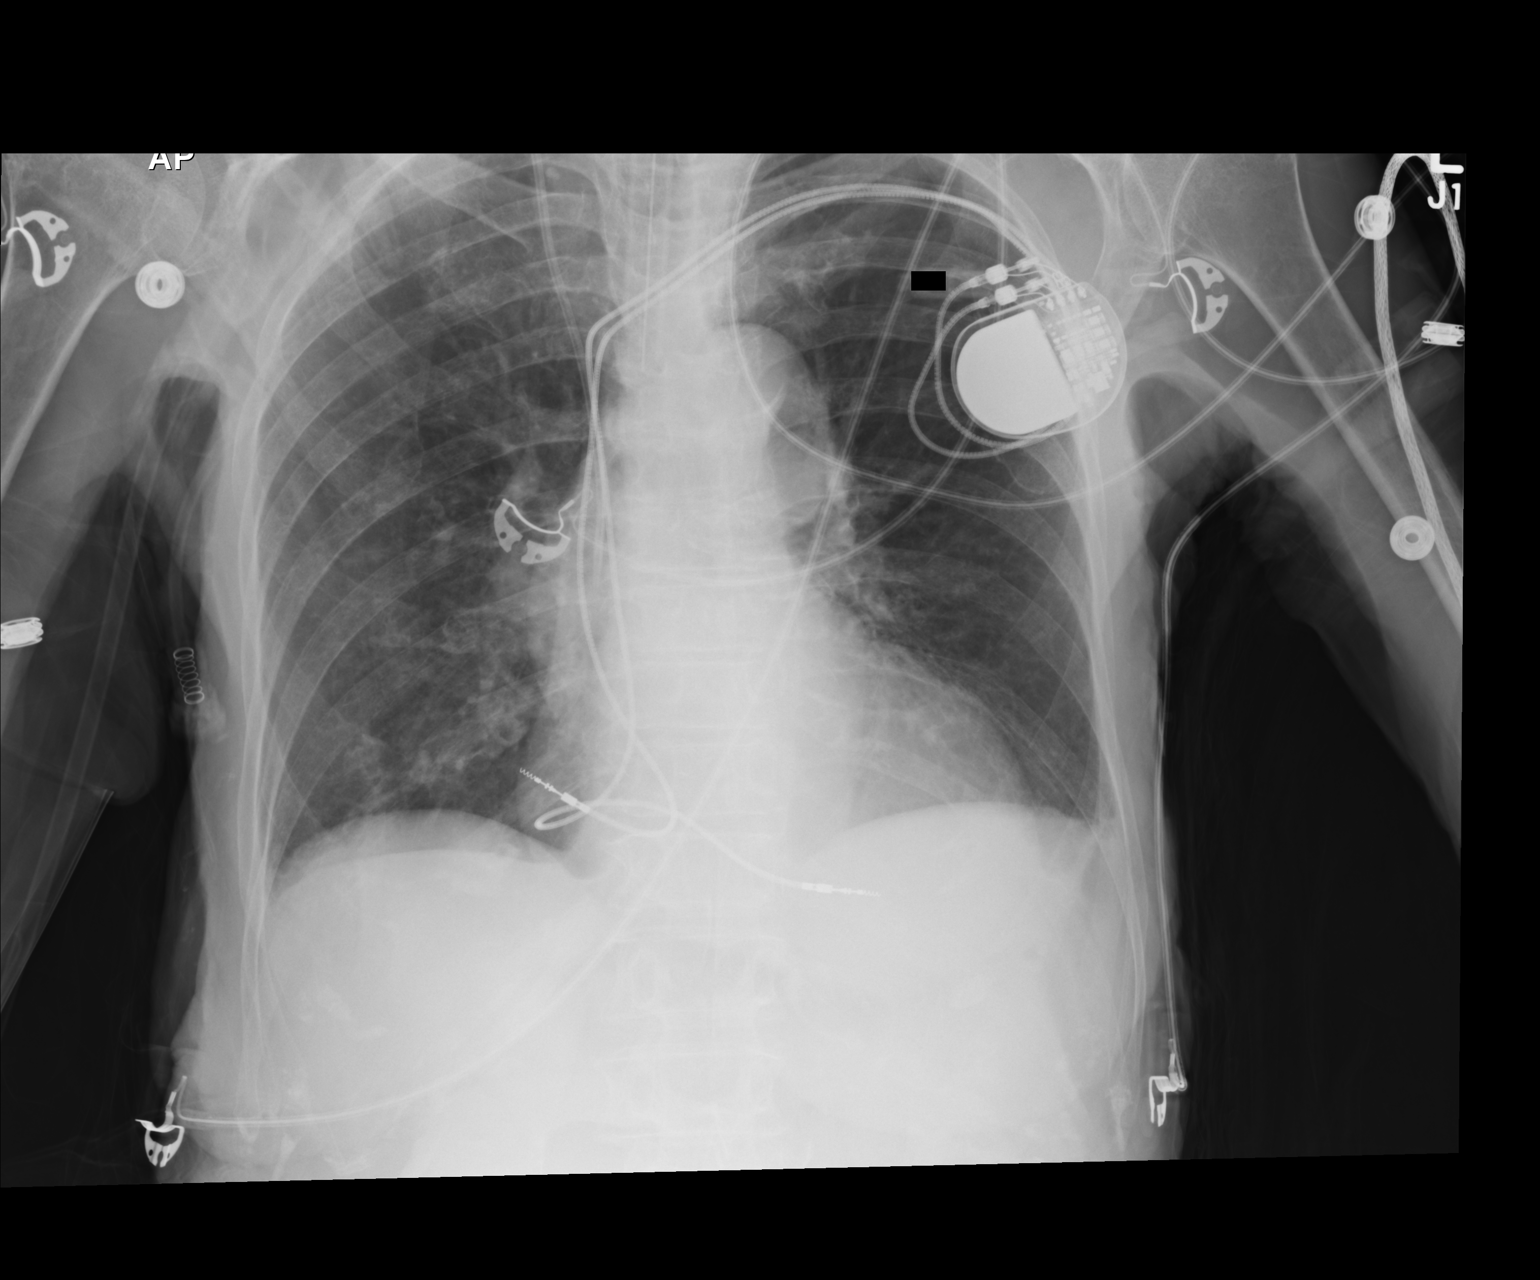

[1 of 1 positions shown; findings below may reference images not displayed]

FINDINGS: Grossly unchanged cardiac silhouette and mediastinal contours given
patient rotation.  Minimal atherosclerotic calcifications within
the aortic arch.  Interval placement of right jugular approach
central venous catheter with tip projecting over the superior
cavoatrial junction.  Stable positioning of remaining support
apparatus.  No pneumothorax.  The lungs remain hyperexpanded with
flattening of bilateral diaphragms.  There is grossly unchanged
trace left-sided pleural effusion with associated minimal left
basilar heterogeneous opacities.  No new focal airspace opacities.
Unchanged bones.
IMPRESSION: 1.  Interval placement of right jugular approach central venous
catheter with tip projecting over the superior cavoatrial junction.
Otherwise, stable positioning of support apparatus.  No
pneumothorax.

2. Unchanged trace left-sided effusion and associated left basilar
opacities, atelectasis versus infiltrate.

## 2014-09-16 IMAGING — CR DG CHEST 1V PORT
1 series · 1 of 1 positions shown · non-contrast
Comparison: 02/10/2013

CLINICAL DATA: Evaluate ETT, lines

PORTABLE CHEST - 1 VIEW

[AP]
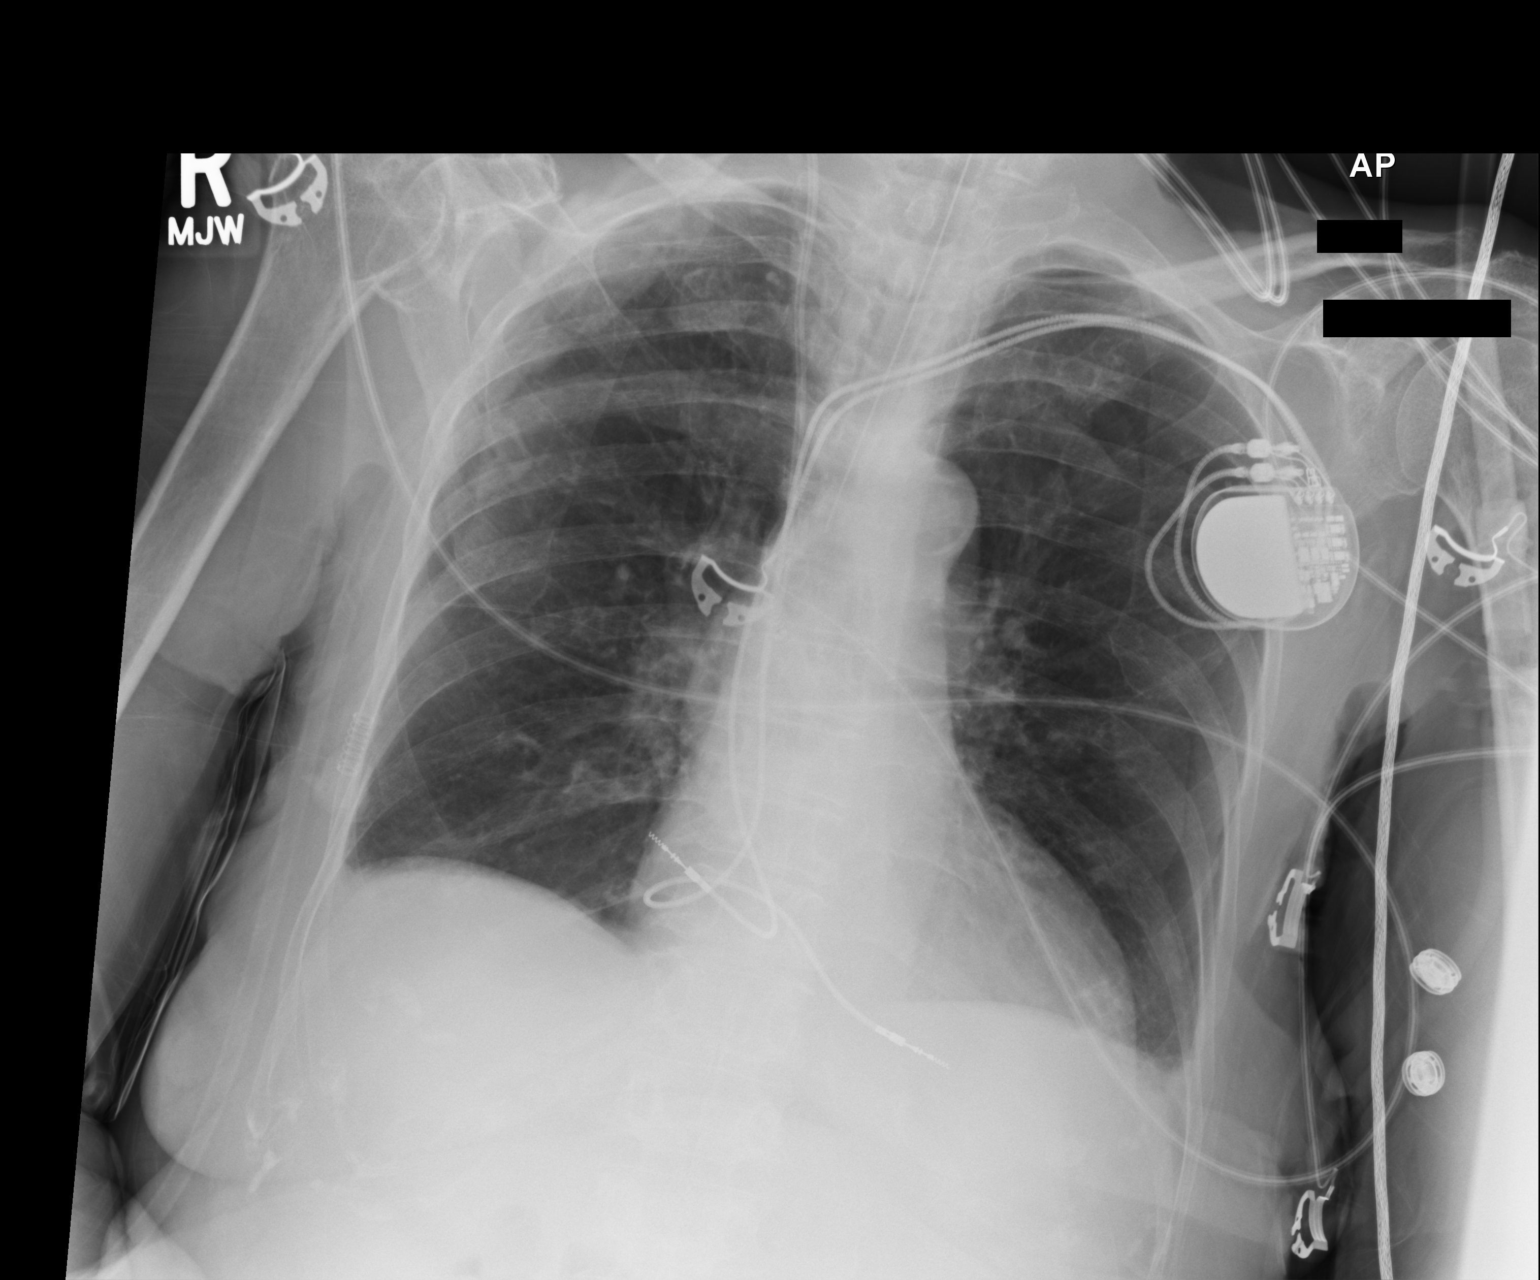

[1 of 1 positions shown; findings below may reference images not displayed]

FINDINGS: Endotracheal tube terminates 1.5 cm above the carina.
Stable right IJ venous catheter.

Chronic interstitial markings.  No focal consolidation. Mild
blunting of the bilateral costophrenic angles.  No pneumothorax.

The heart is normal in size.

Left subclavian pacemaker.
IMPRESSION: Endotracheal tube terminates 1.5 cm above the carina.

No interval change.

## 2014-09-22 IMAGING — CR DG ABD PORTABLE 1V
1 series · 1 of 1 positions shown · non-contrast
Comparison: CT 02/08/2013

CLINICAL DATA: Nausea and vomiting

PORTABLE ABDOMEN - 1 VIEW

[AP]
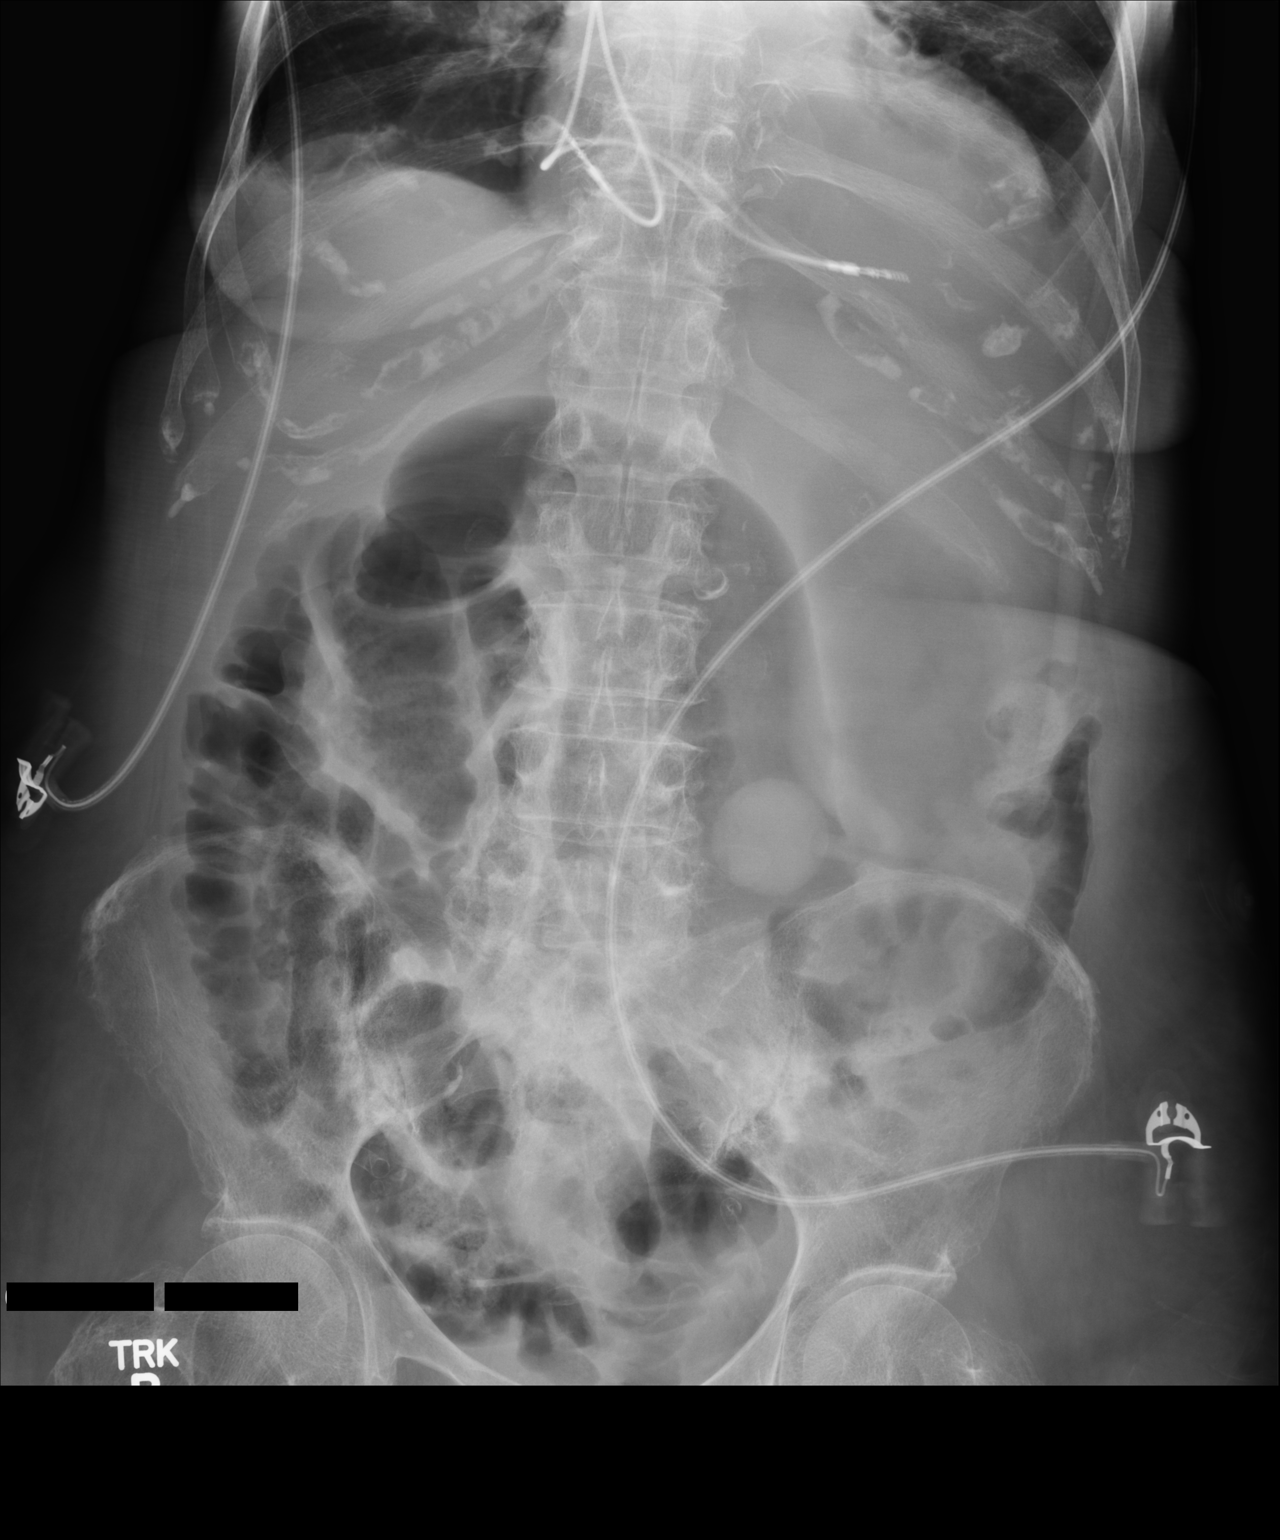

[1 of 1 positions shown; findings below may reference images not displayed]

FINDINGS: Distended large and small bowel loops.  Improvement in
small bowel obstruction compared with the prior study.  Upright
view not obtained.

Gastric feeding tube is present in the left abdomen.
IMPRESSION: Improvement in small bowel obstruction.  Question ileus.

## 2014-09-27 IMAGING — CR DG CHEST 2V
2 series · 2 of 2 positions shown · non-contrast
Comparison: 02/15/2013

CLINICAL DATA: Shortness of breath

CHEST - 2 VIEW

[w chest lat]
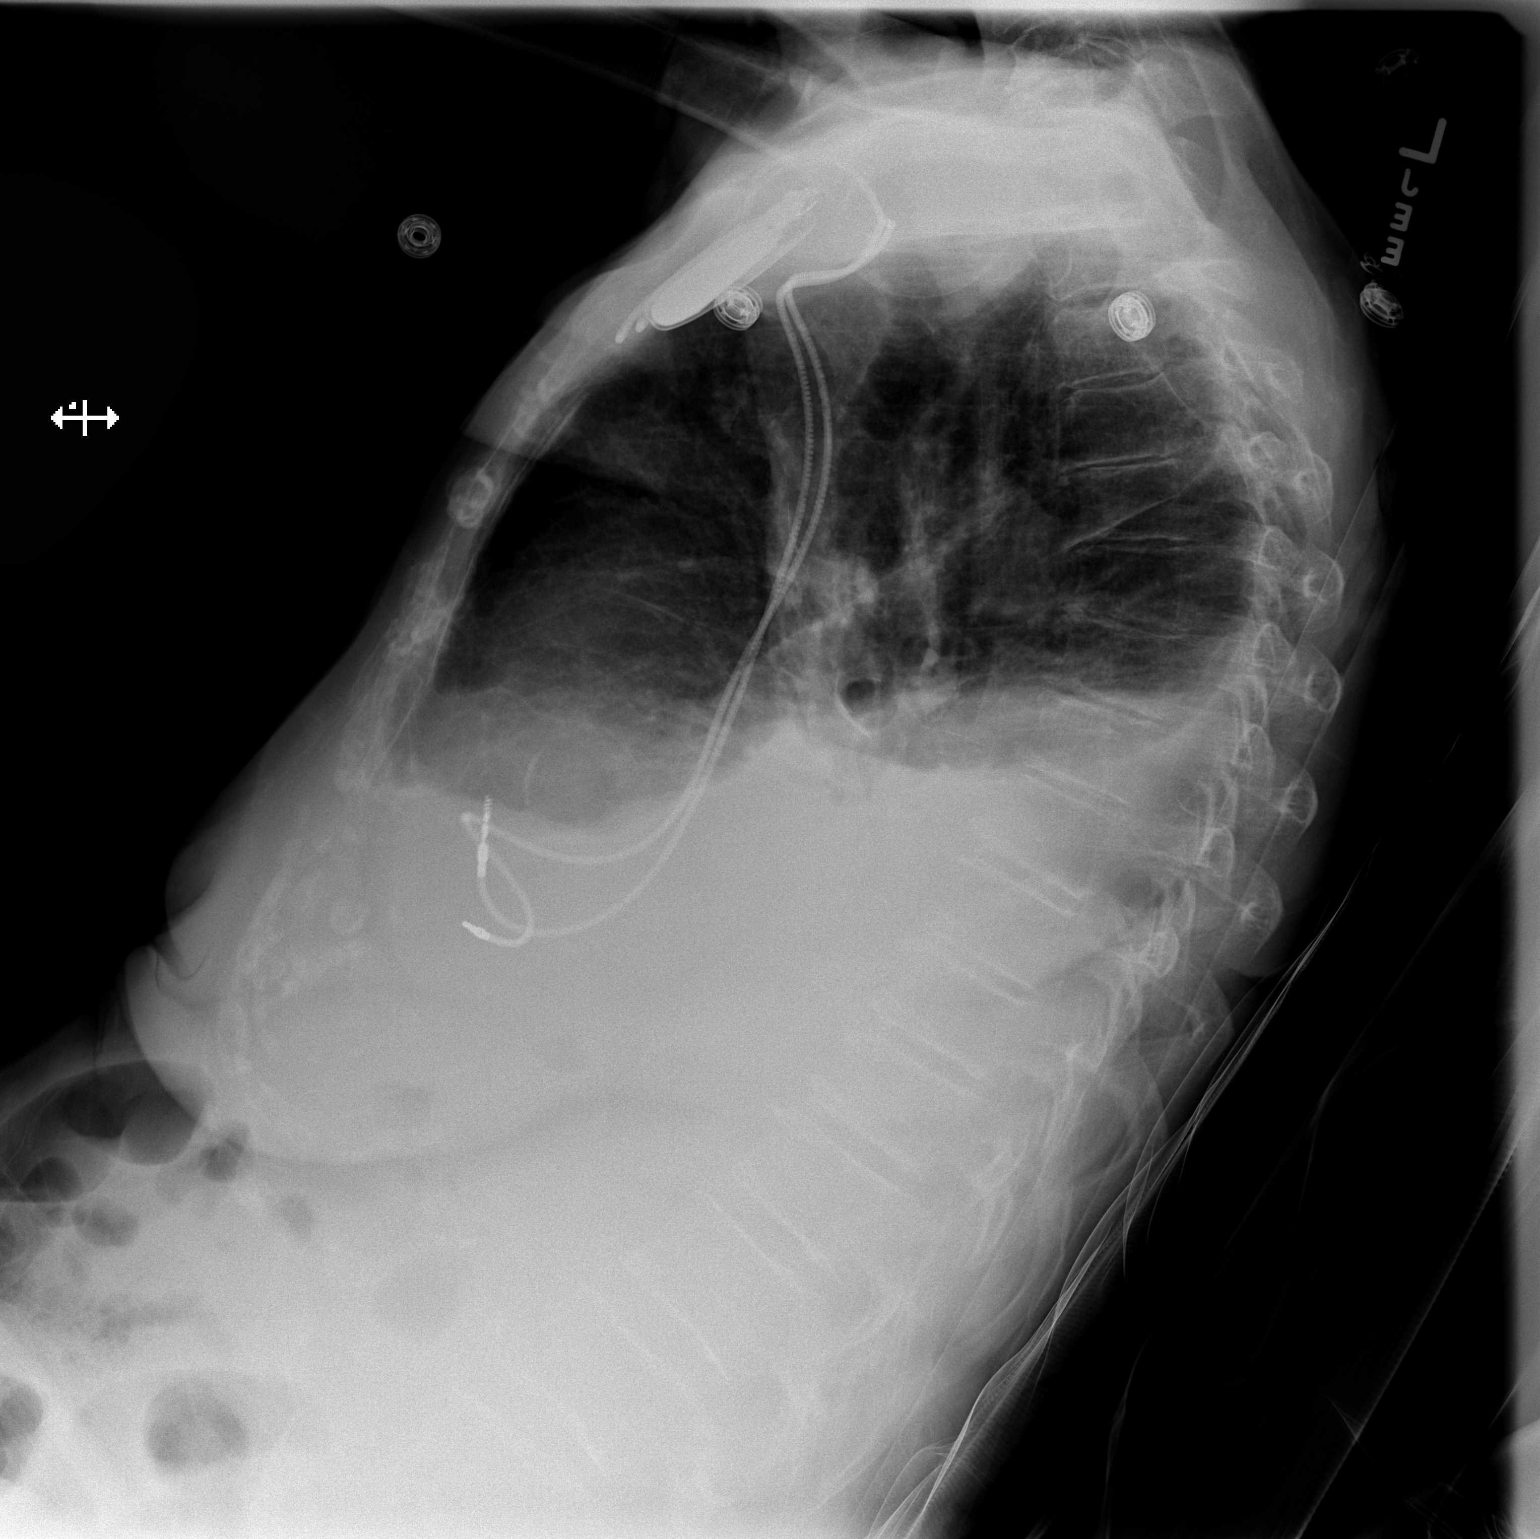

[x chest ap]
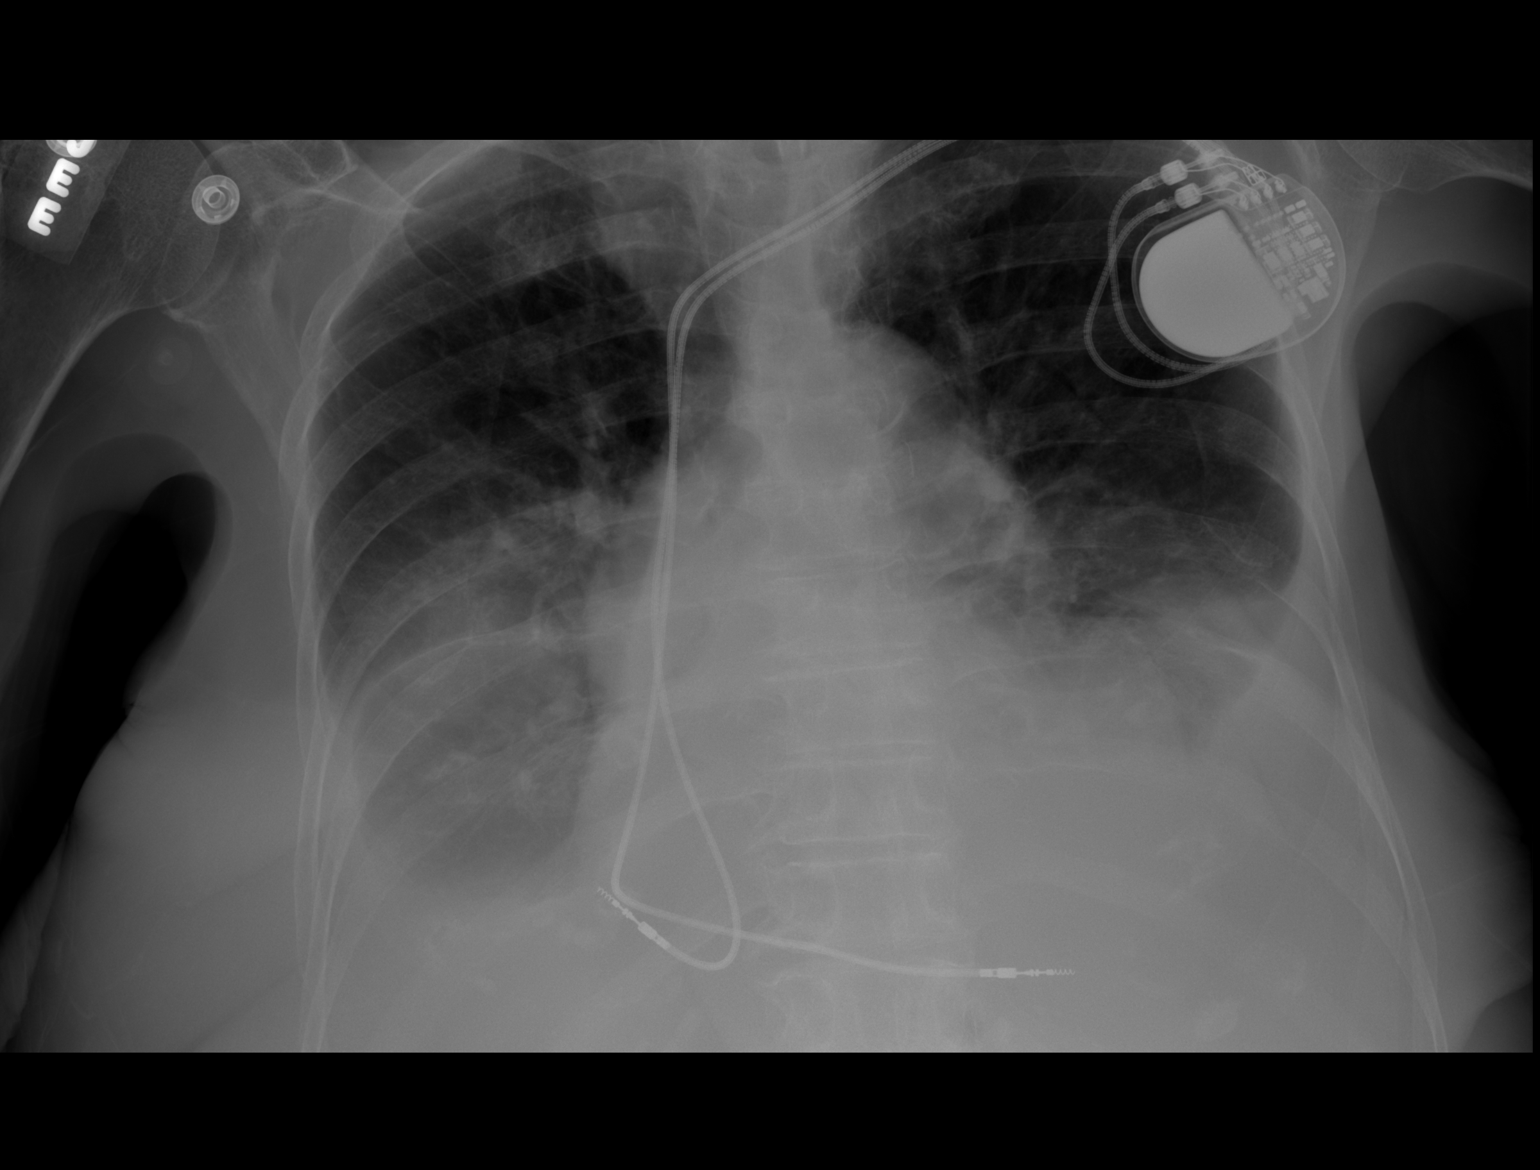

[2 of 2 positions shown; findings below may reference images not displayed]

FINDINGS: Left subclavian sequential pacemaker leads project at right atrium
and right ventricle.
Enlargement of cardiac silhouette.
Calcified tortuous aorta.
Pulmonary vascular congestion.
Bibasilar pleural effusions and atelectasis.
No gross infiltrate or pneumothorax.
Bones diffusely demineralized.
Question underlying emphysematous changes.
IMPRESSION: Enlargement of cardiac silhouette with pulmonary vascular
congestion.
Bibasilar pleural effusions and atelectasis.
Question underlying emphysematous changes.

## 2014-10-18 ENCOUNTER — Ambulatory Visit: Payer: Medicare Other | Admitting: *Deleted

## 2014-10-18 DIAGNOSIS — I441 Atrioventricular block, second degree: Secondary | ICD-10-CM

## 2014-10-18 LAB — MDC_IDC_ENUM_SESS_TYPE_INCLINIC
Brady Statistic AP VS Percent: 20 %
Brady Statistic AS VS Percent: 80 %
Date Time Interrogation Session: 20160217135134
Lead Channel Impedance Value: 308 Ohm
Lead Channel Pacing Threshold Pulse Width: 0.4 ms
Lead Channel Sensing Intrinsic Amplitude: 2 mV
Lead Channel Setting Pacing Amplitude: 2 V
Lead Channel Setting Pacing Amplitude: 2.5 V
Lead Channel Setting Sensing Sensitivity: 5.6 mV
MDC IDC MSMT BATTERY IMPEDANCE: 245 Ohm
MDC IDC MSMT BATTERY REMAINING LONGEVITY: 115 mo
MDC IDC MSMT BATTERY VOLTAGE: 2.79 V
MDC IDC MSMT LEADCHNL RA PACING THRESHOLD AMPLITUDE: 0.5 V
MDC IDC MSMT LEADCHNL RA PACING THRESHOLD PULSEWIDTH: 0.4 ms
MDC IDC MSMT LEADCHNL RV IMPEDANCE VALUE: 370 Ohm
MDC IDC MSMT LEADCHNL RV PACING THRESHOLD AMPLITUDE: 0.5 V
MDC IDC MSMT LEADCHNL RV SENSING INTR AMPL: 22.4 mV
MDC IDC SET LEADCHNL RV PACING PULSEWIDTH: 0.4 ms
MDC IDC STAT BRADY AP VP PERCENT: 0 %
MDC IDC STAT BRADY AS VP PERCENT: 0 %

## 2014-10-18 NOTE — Progress Notes (Signed)
Pacemaker check in clinic. Normal device function. Thresholds, sensing, impedances consistent with previous measurements. Device programmed to maximize longevity. 994 mode switches--longest was 2 minutes 19 seconds--farfield. 2 high ventricular rates noted--longest was 3 seconds. Device programmed at appropriate safety margins. Histogram distribution appropriate for patient activity level. Device programmed to optimize intrinsic conduction. Estimated longevity 9.5 years. Patient enrolled in remote follow-up with [employer]. Pt to bring monitor/[employer] to appointment and to be given instructions on how to send transmissions. ROV in August with GT.

## 2014-11-07 ENCOUNTER — Encounter: Payer: Self-pay | Admitting: Internal Medicine

## 2015-05-18 ENCOUNTER — Encounter: Payer: Self-pay | Admitting: Internal Medicine

## 2015-05-18 ENCOUNTER — Ambulatory Visit (INDEPENDENT_AMBULATORY_CARE_PROVIDER_SITE_OTHER): Payer: Medicare Other | Admitting: Internal Medicine

## 2015-05-18 VITALS — BP 110/70 | HR 70 | Ht 61.0 in | Wt 96.4 lb

## 2015-05-18 DIAGNOSIS — I441 Atrioventricular block, second degree: Secondary | ICD-10-CM | POA: Diagnosis not present

## 2015-05-18 DIAGNOSIS — E43 Unspecified severe protein-calorie malnutrition: Secondary | ICD-10-CM

## 2015-05-18 DIAGNOSIS — Z950 Presence of cardiac pacemaker: Secondary | ICD-10-CM | POA: Diagnosis not present

## 2015-05-18 LAB — CUP PACEART INCLINIC DEVICE CHECK
Battery Impedance: 294 Ohm
Brady Statistic AP VP Percent: 0 %
Brady Statistic AP VS Percent: 18 %
Brady Statistic AS VS Percent: 82 %
Date Time Interrogation Session: 20160916171227
Lead Channel Impedance Value: 368 Ohm
Lead Channel Pacing Threshold Amplitude: 0.5 V
Lead Channel Sensing Intrinsic Amplitude: 2.8 mV
Lead Channel Sensing Intrinsic Amplitude: 22.4 mV
Lead Channel Setting Pacing Amplitude: 2 V
Lead Channel Setting Pacing Pulse Width: 0.4 ms
MDC IDC MSMT BATTERY REMAINING LONGEVITY: 110 mo
MDC IDC MSMT BATTERY VOLTAGE: 2.79 V
MDC IDC MSMT LEADCHNL RA IMPEDANCE VALUE: 318 Ohm
MDC IDC MSMT LEADCHNL RA PACING THRESHOLD AMPLITUDE: 0.5 V
MDC IDC MSMT LEADCHNL RA PACING THRESHOLD PULSEWIDTH: 0.4 ms
MDC IDC MSMT LEADCHNL RV PACING THRESHOLD PULSEWIDTH: 0.4 ms
MDC IDC SET LEADCHNL RV PACING AMPLITUDE: 2.5 V
MDC IDC SET LEADCHNL RV SENSING SENSITIVITY: 5.6 mV
MDC IDC STAT BRADY AS VP PERCENT: 0 %

## 2015-05-18 NOTE — Progress Notes (Signed)
HPI Betty [name] returns today for followup. She is a very pleasant 81 year old woman with a history of complete heart block, status post permanent pacemaker insertion. In the interim, she has done well. She denies chest pain, shortness of breath, or syncope. No peripheral edema. Amazingly, she takes no medications. She notes mild constipation. No Known Allergies   Current Outpatient Prescriptions  Medication Sig Dispense Refill  . feeding supplement (ENSURE COMPLETE) LIQD Take 237 mLs by mouth 3 (three) times daily with meals. 21 Bottle 10   No current facility-administered medications for this visit.     Past Medical History  Diagnosis Date  . GERD (gastroesophageal reflux disease)   . Complete AV block 03/18/12  . BBB (bundle branch block) 03/18/12    left  . Pacemaker   . Refusal of blood transfusions as patient is Jehovah's Witness     ROS:   All systems reviewed and negative except as noted in the HPI.   Past Surgical History  Procedure Laterality Date  . No past surgeries    . Pacemaker insertion  03/18/12    initial placement  . Laparotomy N/A 02/09/2013    Procedure: Exploratory Laparotomy; Repair of umbilical hernia; repair of obturator hernia; Placement of wound vac; placement of gastrostomy tube;  Surgeon: [name], MD;  Location: [facility] OR;  Service: General;  Laterality: N/A;  . Colon resection N/A 02/10/2013    Procedure: EXPLORATORY LAPAROTOMY, CLOSURE OF ABDOMEN;  Surgeon: [name], MD;  Location: [facility] OR;  Service: General;  Laterality: N/A;  . Hernia repair    . Permanent pacemaker insertion N/A 03/18/2012    Procedure: PERMANENT PACEMAKER INSERTION;  Surgeon: Marinus Maw, MD;  Location: [facility] CATH LAB;  Service: Cardiovascular;  Laterality: N/A;     Family History  Problem Relation Age of Onset  . Cancer Sister     breast     Social History   Social History  . Marital Status: Widowed    Spouse Name: N/A  . Number of Children: N/A  .  Years of Education: N/A   Occupational History  . Not on file.   Social History Main Topics  . Smoking status: Never Smoker   . Smokeless tobacco: Never Used  . Alcohol Use: No  . Drug Use: No  . Sexual Activity: No   Other Topics Concern  . Not on file   Social History Narrative     BP 110/70 mmHg  Pulse 70  Ht  (1.549 m)  Wt 96 lb 6.4 oz (43.727 kg)  BMI 18.22 kg/m2  SpO2 99%  Physical Exam:  Frail, but otherwise well appearing elderly woman, NAD HEENT: Unremarkable Neck: no JVD, no thyromegally Lungs:  Clear with no wheezes, rales, or rhonchi. Well-healed pacemaker incision. HEART:  Regular rate rhythm, no murmurs, no rubs, no clicks Abd:  soft, positive bowel sounds, no organomegally, no rebound, no guarding Ext:  2 plus pulses, no edema, no cyanosis, no clubbing Skin:  No rashes no nodules Neuro:  CN II through XII intact, motor grossly intact   DEVICE  Normal device function.  See PaceArt for details.   Assess/Plan:

## 2015-05-18 NOTE — Assessment & Plan Note (Signed)
Her medtronic DDD PM is working normally. Will recheck in several months. 

## 2015-05-18 NOTE — Assessment & Plan Note (Signed)
We discussed the importance of adequate nutrition. She denies losing weight.

## 2015-05-18 NOTE — Patient Instructions (Addendum)
Medication Instructions:  Your physician recommends that you continue on your current medications as directed. Please refer to the Current Medication list given to you today.  Labwork: None ordered  Testing/Procedures: None ordered  Follow-Up: Your physician wants you to follow-up in: 6 months with device clinic.  You will receive a reminder letter in the mail two months in advance. If you don't receive a letter, please call our office to schedule the follow-up appointment.  Your physician wants you to follow-up in: 1 year with Dr. Taylor.  You will receive a reminder letter in the mail two months in advance. If you don't receive a letter, please call our office to schedule the follow-up appointment.  Any Other Special Instructions Will Be Listed Below (If Applicable). Thank you for choosing [facility]!!         

## 2016-04-10 ENCOUNTER — Encounter: Payer: Self-pay | Admitting: Nurse Practitioner

## 2016-04-10 NOTE — Progress Notes (Signed)
Electrophysiology Office Note Date: 04/11/2016  ID:  Betty [name], DOB 1914-11-09, MRN [id number]  PCP: Tommie Raymond, MD Electrophysiologist: Ladona Ridgel  CC: Pacemaker follow-up  Betty [name] is a 82 y.o. female seen today for Dr Ladona Ridgel.  She presents today for routine electrophysiology followup.  Since last being seen in our clinic, the patient reports doing reasonably well.  She lives at home with her daughter and is still able to get around the house without problems. Her appetite is not as good as it used to be and she is drinking Ensure supplements 3 times per day.   She denies chest pain, palpitations, dyspnea, PND, orthopnea, nausea, vomiting, dizziness, syncope, edema, weight gain, or early satiety.  Device History: MDT dual chamber PPM implanted 2013 for symptomatic bradycardia    Past Medical History:  Diagnosis Date  . BBB (bundle branch block) 03/18/12   left  . 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 GERD (gastroesophageal reflux disease)   . Pacemaker   . Refusal of blood transfusions as patient is Jehovah's Witness    Past Surgical History:  Procedure Laterality Date  . COLON RESECTION N/A 02/10/2013   Procedure: EXPLORATORY LAPAROTOMY, CLOSURE OF ABDOMEN;  Surgeon: [name], MD;  Location: [facility] OR;  Service: General;  Laterality: N/A;  . HERNIA REPAIR    . LAPAROTOMY N/A 02/09/2013   Procedure: Exploratory Laparotomy; Repair of umbilical hernia; repair of obturator hernia; Placement of wound vac; placement of gastrostomy tube;  Surgeon: [name], MD;  Location: [facility] OR;  Service: General;  Laterality: N/A;  . NO PAST SURGERIES    . PACEMAKER INSERTION  03/18/12   initial placement  . PERMANENT PACEMAKER INSERTION N/A 03/18/2012   Procedure: PERMANENT PACEMAKER INSERTION;  Surgeon: Marinus Maw, MD;  Location: [facility] CATH LAB;  Service: Cardiovascular;  Laterality: N/A;    Current Outpatient Prescriptions  Medication Sig Dispense Refill  . Ascorbic Acid  (VITAMIN C) 100 MG tablet Take 100 mg by mouth daily.    . feeding supplement (ENSURE COMPLETE) LIQD Take 237 mLs by mouth 3 (three) times daily with meals. 21 Bottle 10   No current facility-administered medications for this visit.     Allergies:   Review of patient's allergies indicates no known allergies.   Social History: Social History   Social History  . Marital status: Widowed    Spouse name: N/A  . Number of children: N/A  . Years of education: N/A   Occupational History  . Not on file.   Social History Main Topics  . Smoking status: Never Smoker  . Smokeless tobacco: Never Used  . Alcohol use No  . Drug use: No  . Sexual activity: No   Other Topics Concern  . Not on file   Social History Narrative  . No narrative on file    Family History: Family History  Problem Relation Age of Onset  . Stroke Father   . Cancer Sister     breast     Review of Systems: All other systems reviewed and are otherwise negative except as noted above.   Physical Exam: VS:  BP 102/60   Pulse 75   Ht  (1.549 m)   Wt 81 lb (36.7 kg)   SpO2 99%   BMI 15.30 kg/m  , BMI Body mass index is 15.3 kg/m.  GEN- The patient is elderly and frail appearing, alert and oriented x 3 today.   HEENT: normocephalic, atraumatic;  sclera clear, conjunctiva pink; hearing intact; oropharynx clear; neck supple  Lungs- Clear to ausculation bilaterally, normal work of breathing.  No wheezes, rales, rhonchi Heart- Regular rate and rhythm  GI- soft, non-tender, non-distended, bowel sounds present Extremities- no clubbing, cyanosis, or edema  MS- no significant deformity or atrophy Skin- warm and dry, no rash or lesion; PPM pocket well healed Psych- euthymic mood, full affect Neuro- strength and sensation are intact  PPM Interrogation- reviewed in detail today,  See PACEART report  EKG:  EKG is not ordered today.  Recent Labs: No results found for requested labs within last 8760 hours.    Wt Readings from Last 3 Encounters:  04/11/16 81 lb (36.7 kg)  05/18/15 96 lb 6.4 oz (43.7 kg)  04/17/14 94 lb (42.6 kg)     Other studies Reviewed: Additional studies/ records that were reviewed today include: Dr Lubertha Basqueaylor's office notes  Assessment and Plan:  1.  Symptomatic bradycardia Normal PPM function See Pace Art report No changes today She has some atrial oversensing resulting in false mode switches. I have not made changes today.    Current medicines are reviewed at length with the patient today.   The patient does not have concerns regarding her medicines.  The following changes were made today:  none  Labs/ tests ordered today include: none No orders of the defined types were placed in this encounter.    Disposition:   Follow up with [facility], 1 year with Dr Ladona Ridgelaylor     Signed, Gypsy BalsamAmber Seiler, NP 04/11/2016 8:44 AM  [facility] [address] [place] [address] [phone] (office) [phone] (fax)

## 2016-04-11 ENCOUNTER — Ambulatory Visit (INDEPENDENT_AMBULATORY_CARE_PROVIDER_SITE_OTHER): Payer: Medicare Other | Admitting: Nurse Practitioner

## 2016-04-11 ENCOUNTER — Encounter: Payer: Self-pay | Admitting: Nurse Practitioner

## 2016-04-11 ENCOUNTER — Encounter: Payer: Self-pay | Admitting: Internal Medicine

## 2016-04-11 VITALS — BP 102/60 | HR 75 | Ht 61.0 in | Wt 81.0 lb

## 2016-04-11 DIAGNOSIS — R001 Bradycardia, unspecified: Secondary | ICD-10-CM | POA: Diagnosis not present

## 2016-04-11 LAB — CUP PACEART INCLINIC DEVICE CHECK
Implantable Lead Implant Date: 20130718
Implantable Lead Location: 753860
Implantable Lead Model: 5076
Implantable Lead Model: 5076
MDC IDC LEAD IMPLANT DT: 20130718
MDC IDC LEAD LOCATION: 753859
MDC IDC SESS DTM: 20170811095033

## 2016-04-11 NOTE — Patient Instructions (Addendum)
Medication Instructions:   Your physician recommends that you continue on your current medications as directed. Please refer to the Current Medication list given to you today.   If you need a refill on your cardiac medications before your next appointment, please call your pharmacy.  Labwork: NONE ORDER TODAY    Testing/Procedures: NONE ORDER TODAY    Follow-Up: Remote monitoring is used to monitor your Pacemaker of ICD from home. This monitoring reduces the number of office visits required to check your device to one time per year. It allows us to keep an eye on the functioning of your device to ensure it is working properly. You are scheduled for a device check from home on 07/11/2016... You may send your transmission at any time that day. If you have a wireless device, the transmission will be sent automatically. After your physician reviews your transmission, you will receive a postcard with your next transmission date.    Your physician wants you to follow-up in: ONE YEAR WITH Ladona RidgelAYLOR  You will receive a reminder letter in the mail two months in advance. If you don't receive a letter, please call our office to schedule the follow-up appointment.     Any Other Special Instructions Will Be Listed Below (If Applicable).

## 2016-07-11 ENCOUNTER — Telehealth: Payer: Self-pay | Admitting: Cardiology

## 2016-07-11 ENCOUNTER — Encounter: Payer: Medicare Other | Admitting: *Deleted

## 2016-07-11 NOTE — Telephone Encounter (Signed)
Confirmed remote transmission w/ pt daughter.   

## 2016-07-18 ENCOUNTER — Encounter: Payer: Self-pay | Admitting: Cardiology

## 2016-07-18 NOTE — Progress Notes (Signed)
Letter  

## 2016-10-27 ENCOUNTER — Ambulatory Visit (INDEPENDENT_AMBULATORY_CARE_PROVIDER_SITE_OTHER): Payer: Medicare Other | Admitting: *Deleted

## 2016-10-27 DIAGNOSIS — I441 Atrioventricular block, second degree: Secondary | ICD-10-CM | POA: Diagnosis not present

## 2016-10-27 NOTE — Progress Notes (Signed)
Pacemaker check in clinic. Normal device function. Thresholds, sensing, impedances consistent with previous measurements. Device programmed to maximize longevity. 1800 AMS, EGMs available indicate FFRW. 3 VHR, EGM (1)1:1, (2) Vs>As peak V 202bpm~6sec. Device programmed at appropriate safety margins. Histogram distribution appropriate for patient activity level. Device programmed to optimize intrinsic conduction. Estimated longevity 7 years. Remote 01/27/2017. ROV w/ GT 05/2017

## 2016-11-18 ENCOUNTER — Other Ambulatory Visit: Payer: Self-pay | Admitting: Internal Medicine

## 2017-01-27 ENCOUNTER — Encounter: Payer: Medicare Other | Admitting: *Deleted

## 2017-01-27 ENCOUNTER — Telehealth: Payer: Self-pay | Admitting: Cardiology

## 2017-01-27 NOTE — Telephone Encounter (Signed)
Attempted to confirm remote transmission with pt. No answer and was unable to leave a message.   

## 2017-01-30 ENCOUNTER — Encounter: Payer: Self-pay | Admitting: Cardiology

## 2017-05-12 ENCOUNTER — Ambulatory Visit (INDEPENDENT_AMBULATORY_CARE_PROVIDER_SITE_OTHER): Payer: Medicare Other | Admitting: Internal Medicine

## 2017-05-12 ENCOUNTER — Encounter: Payer: Self-pay | Admitting: Internal Medicine

## 2017-05-12 VITALS — BP 106/64 | HR 75 | Ht 61.0 in | Wt 73.6 lb

## 2017-05-12 DIAGNOSIS — I442 Atrioventricular block, complete: Secondary | ICD-10-CM

## 2017-05-12 NOTE — Progress Notes (Signed)
HPI Mrs. Heaps returns today for ongoing evaluation and management of sinus node dysfunction, heart block, status post pacemaker insertion. The patient is a 83 year old woman with a long history of the above problems who underwent pacemaker generator change out approximately 5 years ago. She has continued to gradually week and this time as gone on, and continues to have problems with unintentional weight loss. She has not had syncope. She denies peripheral edema. No chest pain or shortness of breath. She does have generalized fatigue and weakness. No Known Allergies   Current Outpatient Prescriptions  Medication Sig Dispense Refill  . ferrous sulfate 300 (60 Fe) MG/5ML syrup Take 5 mLs by mouth daily.    . Ascorbic Acid (VITAMIN C) 100 MG tablet Take 100 mg by mouth daily.    . feeding supplement (ENSURE COMPLETE) LIQD Take 237 mLs by mouth 3 (three) times daily with meals. 21 Bottle 10  . potassium chloride 20 MEQ/15ML (10%) SOLN Take 15 mLs by mouth daily.  11   No current facility-administered medications for this visit.      Past Medical History:  Diagnosis Date  . BBB (bundle branch block) 03/18/12   left  . 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 GERD (gastroesophageal reflux disease)   . Pacemaker   . Refusal of blood transfusions as patient is Jehovah's Witness     ROS:   All systems reviewed and negative except as noted in the HPI.   Past Surgical History:  Procedure Laterality Date  . COLON RESECTION N/A 02/10/2013   Procedure: EXPLORATORY LAPAROTOMY, CLOSURE OF ABDOMEN;  Surgeon: Shelly Rubensteinouglas A Blackman, MD;  Location: [facility] OR;  Service: General;  Laterality: N/A;  . HERNIA REPAIR    . LAPAROTOMY N/A 02/09/2013   Procedure: Exploratory Laparotomy; Repair of umbilical hernia; repair of obturator hernia; Placement of wound vac; placement of gastrostomy tube;  Surgeon: Clovis Puhomas A. Cornett, MD;  Location: [facility] OR;  Service: General;  Laterality: N/A;  . NO PAST SURGERIES    .  PACEMAKER INSERTION  03/18/12   initial placement  . PERMANENT PACEMAKER INSERTION N/A 03/18/2012   Procedure: PERMANENT PACEMAKER INSERTION;  Surgeon: Marinus MawGregg W Mostafa Yuan, MD;  [facility];  Service: Cardiovascular;  Laterality: N/A;     Family History  Problem Relation Age of Onset  . Stroke Father   . Cancer Sister        breast     Social History   Social History  . Marital status: Widowed    Spouse name: N/A  . Number of children: N/A  . Years of education: N/A   Occupational History  . Not on file.   Social History Main Topics  . Smoking status: Never Smoker  . Smokeless tobacco: Never Used  . Alcohol use No  . Drug use: No  . Sexual activity: No   Other Topics Concern  . Not on file   Social History Narrative  . No narrative on file     BP 106/64   Pulse 75   Ht 5\' 1"  (1.549 m)   Wt 73 lb 9.6 oz (33.4 kg)   SpO2 97%   BMI 13.91 kg/m   Physical Exam:   Phan and frailappearing  elderly woman,NAD HEENT: Unremarkable Neck:  6 cm  JVD, no thyromegally Lymphatics:  No adenopathy Back:  No CVA tenderness Lungs:  Clear, except for rales in the bases on the right, no wheezes or rhonchi, well-healed pacemaker incision.  HEART:  Regular rate rhythm, no murmurs, no rubs, no clicks Abd:  soft, positive bowel sounds, no organomegally, no rebound, no guarding Ext:  2 plus pulses, no edema, no cyanosis, no clubbing Skin:  No rashes no nodules Neuro:  CN II through XII intact, motor grossly intact   DEVICE  Normal device function.  See PaceArt for details.   Assess/Plan: 1. Complete heart block - she is actually conducting today. She is asymptomatic 2. Pacemaker - her Medtronic dual-chamber pacemaker has been interrogated and is working normally. We'll plan to recheck in several months. 3. Weight loss - no obvious etiology. I suspect this represents her getting over more than anything else. She will continue her feeding supplements.  Lewayne Bunting,  [name]

## 2017-05-12 NOTE — Patient Instructions (Addendum)
Medication Instructions:  Your physician recommends that you continue on your current medications as directed. Please refer to the Current Medication list given to you today.  Labwork: None ordered.  Testing/Procedures: None ordered.  Follow-Up: Your physician wants you to follow-up in: one year with Dr. Ladona Ridgelaylor.   You will receive a reminder letter in the mail two months in advance. If you don't receive a letter, please call our office to schedule the follow-up appointment.  You will follow up with the device clinic in 6 months for a device check.  Any Other Special Instructions Will Be Listed Below (If Applicable).   If you need a refill on your cardiac medications before your next appointment, please call your pharmacy.

## 2017-05-15 LAB — CUP PACEART INCLINIC DEVICE CHECK
Battery Impedance: 850 Ohm
Battery Remaining Longevity: 70 mo
Battery Voltage: 2.78 V
Brady Statistic AP VS Percent: 16 %
Brady Statistic AS VP Percent: 1 %
Implantable Lead Implant Date: 20130718
Implantable Lead Implant Date: 20130718
Implantable Lead Location: 753860
Implantable Lead Model: 5076
Implantable Pulse Generator Implant Date: 20130718
Lead Channel Impedance Value: 351 Ohm
Lead Channel Impedance Value: 386 Ohm
Lead Channel Pacing Threshold Amplitude: 0.375 V
Lead Channel Pacing Threshold Amplitude: 0.5 V
Lead Channel Pacing Threshold Amplitude: 0.5 V
Lead Channel Pacing Threshold Amplitude: 0.5 V
Lead Channel Pacing Threshold Pulse Width: 0.4 ms
Lead Channel Pacing Threshold Pulse Width: 0.4 ms
Lead Channel Pacing Threshold Pulse Width: 0.4 ms
Lead Channel Sensing Intrinsic Amplitude: 4 mV
MDC IDC LEAD LOCATION: 753859
MDC IDC MSMT LEADCHNL RV PACING THRESHOLD PULSEWIDTH: 0.4 ms
MDC IDC MSMT LEADCHNL RV SENSING INTR AMPL: 22.4 mV
MDC IDC SESS DTM: 20180911152058
MDC IDC SET LEADCHNL RA PACING AMPLITUDE: 2 V
MDC IDC SET LEADCHNL RV PACING AMPLITUDE: 2.5 V
MDC IDC SET LEADCHNL RV PACING PULSEWIDTH: 0.4 ms
MDC IDC SET LEADCHNL RV SENSING SENSITIVITY: 5.6 mV
MDC IDC STAT BRADY AP VP PERCENT: 0 %
MDC IDC STAT BRADY AS VS PERCENT: 84 %

## 2017-08-10 ENCOUNTER — Encounter: Payer: Medicare Other | Admitting: *Deleted

## 2017-08-14 ENCOUNTER — Encounter: Payer: Self-pay | Admitting: Cardiology

## 2017-11-24 NOTE — Progress Notes (Signed)
Electrophysiology Office Note Date: 12/03/2017  ID:  Betty [name], DOB Feb 20, 1915, MRN [id number]  PCP: Georganna Skeans, MD Electrophysiologist: Ladona Ridgel  CC: Pacemaker follow-up  Betty [name] is a 84 y.o. female seen today for Dr Ladona Ridgel.  She presents today for routine electrophysiology followup.  Since last being seen in our clinic, the patient reports doing very well.  She lives with her daughter. Her appetite has been good. She is gaining some weight.   She denies chest pain, palpitations, dyspnea, PND, orthopnea, nausea, vomiting, dizziness, syncope, edema, weight gain, or early satiety.  Device History: MDT dual chamber PPM implanted 2013 for Mobitz II    Past Medical History:  Diagnosis Date  . BBB (bundle branch block) 03/18/12   left  . 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 GERD (gastroesophageal reflux disease)   . Pacemaker   . Refusal of blood transfusions as patient is Jehovah's Witness    Past Surgical History:  Procedure Laterality Date  . COLON RESECTION N/A 02/10/2013   Procedure: EXPLORATORY LAPAROTOMY, CLOSURE OF ABDOMEN;  Surgeon: [name], MD;  Location: [facility] OR;  Service: General;  Laterality: N/A;  . HERNIA REPAIR    . LAPAROTOMY N/A 02/09/2013   Procedure: Exploratory Laparotomy; Repair of umbilical hernia; repair of obturator hernia; Placement of wound vac; placement of gastrostomy tube;  Surgeon: [name], MD;  Location: [facility] OR;  Service: General;  Laterality: N/A;  . NO PAST SURGERIES    . PACEMAKER INSERTION  03/18/12   initial placement  . PERMANENT PACEMAKER INSERTION N/A 03/18/2012   Procedure: PERMANENT PACEMAKER INSERTION;  Surgeon: Marinus Maw, MD;  Location: [facility] CATH LAB;  Service: Cardiovascular;  Laterality: N/A;    Current Outpatient Medications  Medication Sig Dispense Refill  . Ascorbic Acid (VITAMIN C) 100 MG tablet Take 100 mg by mouth daily.    . feeding supplement (ENSURE COMPLETE) LIQD Take 237 mLs by mouth 3 (three)  times daily with meals. 21 Bottle 10  . ferrous sulfate 300 (60 Fe) MG/5ML syrup Take 5 mLs by mouth daily.     No current facility-administered medications for this visit.     Allergies:   Patient has no known allergies.   Social History: Social History   Socioeconomic History  . Marital status: Widowed    Spouse name: Not on file  . Number of children: Not on file  . Years of education: Not on file  . Highest education level: Not on file  Occupational History  . Not on file  Social Needs  . Financial resource strain: Not on file  . Food insecurity:    Worry: Not on file    Inability: Not on file  . Transportation needs:    Medical: Not on file    Non-medical: Not on file  Tobacco Use  . Smoking status: Never Smoker  . Smokeless tobacco: Never Used  Substance and Sexual Activity  . Alcohol use: No  . Drug use: No  . Sexual activity: Never  Lifestyle  . Physical activity:    Days per week: Not on file    Minutes per session: Not on file  . Stress: Not on file  Relationships  . Social connections:    Talks on phone: Not on file    Gets together: Not on file    Attends religious service: Not on file    Active member of club or organization: Not on file    Attends meetings of  clubs or organizations: Not on file    Relationship status: Not on file  . Intimate partner violence:    Fear of current or ex partner: Not on file    Emotionally abused: Not on file    Physically abused: Not on file    Forced sexual activity: Not on file  Other Topics Concern  . Not on file  Social History Narrative  . Not on file    Family History: Family History  Problem Relation Age of Onset  . Stroke Father   . Cancer Sister        breast     Review of Systems: All other systems reviewed and are otherwise negative except as noted above.   Physical Exam: VS:  BP (!) 112/52   Pulse 99   Ht 5\' 1"  (1.549 m)   Wt 80 lb (36.3 kg)   SpO2 99%   BMI 15.12 kg/m  , BMI Body  mass index is 15.12 kg/m.  GEN- The patient is elderly and frail appearing, alert and oriented x 3 today.   HEENT: normocephalic, atraumatic; sclera clear, conjunctiva pink; hearing intact; oropharynx clear; neck supple  Lungs- Clear to ausculation bilaterally, normal work of breathing.  No wheezes, rales, rhonchi Heart- Regular rate and rhythm  GI- soft, non-tender, non-distended, bowel sounds present  Extremities- no clubbing, cyanosis, or edema, partial amputation of 3 fingers  MS- no significant deformity or atrophy Skin- warm and dry, no rash or lesion; PPM pocket well healed Psych- euthymic mood, full affect Neuro- strength and sensation are intact  PPM Interrogation- reviewed in detail today,  See PACEART report  EKG:  EKG is not ordered today.  Recent Labs: No results found for requested labs within last 8760 hours.   Wt Readings from Last 3 Encounters:  12/01/17 80 lb (36.3 kg)  05/12/17 73 lb 9.6 oz (33.4 kg)  04/11/16 81 lb (36.7 kg)     Other studies Reviewed: Additional studies/ records that were reviewed today include: Dr Lubertha Basqueaylor's office notes  Assessment and Plan:  1.  Complete heart block  Normal PPM function See Pace Art report No changes today   Current medicines are reviewed at length with the patient today.   The patient does not have concerns regarding her medicines.  The following changes were made today:  none  Labs/ tests ordered today include: none Orders Placed This Encounter  Procedures  . CUP PACEART INCLINIC DEVICE CHECK     Disposition:   Follow up with [facility], me in 6 months    Signed, Gypsy BalsamAmber Aamari Strawderman, NP 12/03/2017 7:57 AM  [facility] [address] [place] [address] [phone] (office) [phone] (fax)

## 2017-12-01 ENCOUNTER — Ambulatory Visit (INDEPENDENT_AMBULATORY_CARE_PROVIDER_SITE_OTHER): Payer: Medicare Other | Admitting: Nurse Practitioner

## 2017-12-01 ENCOUNTER — Encounter: Payer: Self-pay | Admitting: Nurse Practitioner

## 2017-12-01 VITALS — BP 112/52 | HR 99 | Ht 61.0 in | Wt 80.0 lb

## 2017-12-01 DIAGNOSIS — I442 Atrioventricular block, complete: Secondary | ICD-10-CM

## 2017-12-01 LAB — CUP PACEART INCLINIC DEVICE CHECK
Implantable Lead Implant Date: 20130718
Implantable Lead Location: 753860
MDC IDC LEAD IMPLANT DT: 20130718
MDC IDC LEAD LOCATION: 753859
MDC IDC PG IMPLANT DT: 20130718
MDC IDC SESS DTM: 20190402143232

## 2017-12-01 NOTE — Patient Instructions (Addendum)
Medication Instructions:   Your physician recommends that you continue on your current medications as directed. Please refer to the Current Medication list given to you today.   If you need a refill on your cardiac medications before your next appointment, please call your pharmacy.  Labwork:  NONE ORDERED  TODAY    Testing/Procedures: NONE ORDERED  TODAY    Follow-Up:  Your physician wants you to follow-up in:  IN  6  MONTHS WITH SEILER  You will receive a reminder letter in the mail two months in advance. If you don't receive a letter, please call our office to schedule the follow-up appointment.   SEND IN A TRANSMISSION TODAY  THEN  03-02-18     AND CONTACT  (1 800 [employer] IF IT DOESN'T WORK  )   Any Other Special Instructions Will Be Listed Below (If Applicable).

## 2018-05-27 ENCOUNTER — Ambulatory Visit (INDEPENDENT_AMBULATORY_CARE_PROVIDER_SITE_OTHER): Payer: Medicare Other | Admitting: Internal Medicine

## 2018-05-27 ENCOUNTER — Encounter: Payer: Self-pay | Admitting: Internal Medicine

## 2018-05-27 VITALS — BP 122/58 | HR 80 | Ht 61.0 in | Wt 80.6 lb

## 2018-05-27 DIAGNOSIS — I441 Atrioventricular block, second degree: Secondary | ICD-10-CM | POA: Diagnosis not present

## 2018-05-27 DIAGNOSIS — Z950 Presence of cardiac pacemaker: Secondary | ICD-10-CM

## 2018-05-27 NOTE — Progress Notes (Signed)
HPI Ms. Longino returns today for ongoing PPM evaluation and management. SHe is a pleasant 84 yo woman with sinus node dysfunction, s/p PPM insertion years ago. She notes that her weight continues to go down gradually.  No Known Allergies   Current Outpatient Medications  Medication Sig Dispense Refill  . Ascorbic Acid (VITAMIN C) 100 MG tablet Take 100 mg by mouth daily.    . feeding supplement (ENSURE COMPLETE) LIQD Take 237 mLs by mouth 3 (three) times daily with meals. 21 Bottle 10  . ferrous sulfate 300 (60 Fe) MG/5ML syrup Take 5 mLs by mouth daily.     No current facility-administered medications for this visit.      Past Medical History:  Diagnosis Date  . BBB (bundle branch block) 03/18/12   left  . 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 GERD (gastroesophageal reflux disease)   . Pacemaker   . Refusal of blood transfusions as patient is Jehovah's Witness     ROS:   All systems reviewed and negative except as noted in the HPI.   Past Surgical History:  Procedure Laterality Date  . COLON RESECTION N/A 02/10/2013   Procedure: EXPLORATORY LAPAROTOMY, CLOSURE OF ABDOMEN;  Surgeon: [name], MD;  Location: [facility] OR;  Service: General;  Laterality: N/A;  . HERNIA REPAIR    . LAPAROTOMY N/A 02/09/2013   Procedure: Exploratory Laparotomy; Repair of umbilical hernia; repair of obturator hernia; Placement of wound vac; placement of gastrostomy tube;  Surgeon: [name], MD;  Location: [facility] OR;  Service: General;  Laterality: N/A;  . NO PAST SURGERIES    . PACEMAKER INSERTION  03/18/12   initial placement  . PERMANENT PACEMAKER INSERTION N/A 03/18/2012   Procedure: PERMANENT PACEMAKER INSERTION;  Surgeon: Marinus Maw, MD;  Location: [facility] CATH LAB;  Service: Cardiovascular;  Laterality: N/A;     Family History  Problem Relation Age of Onset  . Stroke Father   . Cancer Sister        breast     Social History   Socioeconomic History  . Marital status:  Widowed    Spouse name: Not on file  . Number of children: Not on file  . Years of education: Not on file  . Highest education level: Not on file  Occupational History  . Not on file  Social Needs  . Financial resource strain: Not on file  . Food insecurity:    Worry: Not on file    Inability: Not on file  . Transportation needs:    Medical: Not on file    Non-medical: Not on file  Tobacco Use  . Smoking status: Never Smoker  . Smokeless tobacco: Never Used  Substance and Sexual Activity  . Alcohol use: No  . Drug use: No  . Sexual activity: Never  Lifestyle  . Physical activity:    Days per week: Not on file    Minutes per session: Not on file  . Stress: Not on file  Relationships  . Social connections:    Talks on phone: Not on file    Gets together: Not on file    Attends religious service: Not on file    Active member of club or organization: Not on file    Attends meetings of clubs or organizations: Not on file    Relationship status: Not on file  . Intimate partner violence:    Fear of current or ex partner: Not on file  Emotionally abused: Not on file    Physically abused: Not on file    Forced sexual activity: Not on file  Other Topics Concern  . Not on file  Social History Narrative  . Not on file     BP (!) 122/58   Pulse 80   Ht 5\' 1"  (1.549 m)   Wt 80 lb 9.6 oz (36.6 kg)   SpO2 99%   BMI 15.23 kg/m   Physical Exam:  Frail appearing elderly woman, NAD HEENT: Unremarkable Neck:  No JVD, no thyromegally Lymphatics:  No adenopathy Back:  No CVA tenderness Lungs:  Clear with no wheezes HEART:  Regular rate rhythm, 2/6 systolic  murmur, no rubs, no clicks Abd:  soft, positive bowel sounds, no organomegally, no rebound, no guarding Ext:  2 plus pulses, no edema, no cyanosis, no clubbing Skin:  No rashes no nodules Neuro:  CN II through XII intact, motor grossly intact  EKG - none  DEVICE  Normal device function.  See PaceArt for details.     Assess/Plan: 1. Sinus node dysfunction/CHB - she is asymptomatic, s/p PPM insertion. 2. PPM - her medtronic ddd PM is working normally. We will recheck in several months. 3. Weight loss - her weight is actually up a few pounds since her last visit. She is encouraged to eat more.  Leonia Reeves.D.

## 2018-05-27 NOTE — Patient Instructions (Signed)
Medication Instructions:  Your physician recommends that you continue on your current medications as directed. Please refer to the Current Medication list given to you today.  Labwork: None ordered.  Testing/Procedures: None ordered.  Follow-Up: Your physician wants you to follow-up in: 6 months with the device clinic for a device check.  Your physician wants you to follow-up in: one year with Dr. Taylor.   You will receive a reminder letter in the mail two months in advance. If you don't receive a letter, please call our office to schedule the follow-up appointment.   Any Other Special Instructions Will Be Listed Below (If Applicable).  If you need a refill on your cardiac medications before your next appointment, please call your pharmacy.   

## 2018-06-25 LAB — CUP PACEART INCLINIC DEVICE CHECK
Battery Impedance: 1304 Ohm
Battery Remaining Longevity: 53 mo
Battery Voltage: 2.77 V
Brady Statistic AP VP Percent: 0 %
Brady Statistic AS VP Percent: 1 %
Implantable Lead Implant Date: 20130718
Implantable Lead Location: 753859
Implantable Lead Model: 5076
Implantable Lead Model: 5076
Lead Channel Impedance Value: 291 Ohm
Lead Channel Impedance Value: 351 Ohm
Lead Channel Pacing Threshold Amplitude: 0.375 V
Lead Channel Pacing Threshold Amplitude: 0.5 V
Lead Channel Pacing Threshold Pulse Width: 0.4 ms
Lead Channel Sensing Intrinsic Amplitude: 1 mV
Lead Channel Sensing Intrinsic Amplitude: 22.4 mV
Lead Channel Setting Pacing Amplitude: 2 V
Lead Channel Setting Pacing Amplitude: 2.5 V
Lead Channel Setting Sensing Sensitivity: 5.6 mV
MDC IDC LEAD IMPLANT DT: 20130718
MDC IDC LEAD LOCATION: 753860
MDC IDC MSMT LEADCHNL RA PACING THRESHOLD AMPLITUDE: 0.5 V
MDC IDC MSMT LEADCHNL RA PACING THRESHOLD PULSEWIDTH: 0.4 ms
MDC IDC MSMT LEADCHNL RA PACING THRESHOLD PULSEWIDTH: 0.4 ms
MDC IDC MSMT LEADCHNL RV PACING THRESHOLD AMPLITUDE: 0.5 V
MDC IDC MSMT LEADCHNL RV PACING THRESHOLD PULSEWIDTH: 0.4 ms
MDC IDC PG IMPLANT DT: 20130718
MDC IDC SESS DTM: 20190926140331
MDC IDC SET LEADCHNL RV PACING PULSEWIDTH: 0.4 ms
MDC IDC STAT BRADY AP VS PERCENT: 14 %
MDC IDC STAT BRADY AS VS PERCENT: 85 %

## 2019-04-04 ENCOUNTER — Telehealth: Payer: Self-pay

## 2019-04-04 NOTE — Telephone Encounter (Signed)
    COVID-19 Pre-Screening Questions:  . In the past 7 to 10 days have you had a cough,  shortness of breath, headache, congestion, fever (100 or greater) body aches, chills, sore throat, or sudden loss of taste or sense of smell? . Have you been around anyone with known Covid 19. . Have you been around anyone who is awaiting Covid 19 test results in the past 7 to 10 days? . Have you been around anyone who has been exposed to Covid 19, or has mentioned symptoms of Covid 19 within the past 7 to 10 days?  If you have any concerns/questions about symptoms patients report during screening (either on the phone or at threshold). Contact the provider seeing the patient or DOD for further guidance.  If neither are available contact a member of the leadership team.   No answer/no voicemail  

## 2019-04-05 ENCOUNTER — Encounter (INDEPENDENT_AMBULATORY_CARE_PROVIDER_SITE_OTHER): Payer: Self-pay

## 2019-04-05 ENCOUNTER — Other Ambulatory Visit: Payer: Self-pay

## 2019-04-05 ENCOUNTER — Ambulatory Visit (INDEPENDENT_AMBULATORY_CARE_PROVIDER_SITE_OTHER): Payer: Medicare Other | Admitting: *Deleted

## 2019-04-05 DIAGNOSIS — I441 Atrioventricular block, second degree: Secondary | ICD-10-CM | POA: Diagnosis not present

## 2019-04-05 DIAGNOSIS — Z950 Presence of cardiac pacemaker: Secondary | ICD-10-CM

## 2019-04-05 LAB — CUP PACEART INCLINIC DEVICE CHECK
Battery Impedance: 1644 Ohm
Battery Remaining Longevity: 43 mo
Battery Voltage: 2.76 V
Brady Statistic AP VP Percent: 0 %
Brady Statistic AP VS Percent: 16 %
Brady Statistic AS VP Percent: 1 %
Brady Statistic AS VS Percent: 83 %
Date Time Interrogation Session: 20200804150653
Implantable Lead Implant Date: 20130718
Implantable Lead Implant Date: 20130718
Implantable Lead Location: 753859
Implantable Lead Location: 753860
Implantable Lead Model: 5076
Implantable Lead Model: 5076
Implantable Pulse Generator Implant Date: 20130718
Lead Channel Impedance Value: 288 Ohm
Lead Channel Impedance Value: 339 Ohm
Lead Channel Pacing Threshold Amplitude: 0.5 V
Lead Channel Pacing Threshold Amplitude: 0.5 V
Lead Channel Pacing Threshold Pulse Width: 0.4 ms
Lead Channel Pacing Threshold Pulse Width: 0.4 ms
Lead Channel Sensing Intrinsic Amplitude: 2.8 mV
Lead Channel Sensing Intrinsic Amplitude: 22.4 mV
Lead Channel Setting Pacing Amplitude: 2 V
Lead Channel Setting Pacing Amplitude: 2.5 V
Lead Channel Setting Pacing Pulse Width: 0.4 ms
Lead Channel Setting Sensing Sensitivity: 5.6 mV

## 2019-04-05 NOTE — Progress Notes (Signed)
Pacemaker check in clinic. Normal device function. Thresholds, sensing, impedances consistent with previous measurements. Device programmed to maximize longevity. 0.2% mode switch burden--longest 35min 20sec, available EGMs/markers suggest FFRWs, consistent with FFRWs noted on presentation, RA sensitivity reduced to 0.82mV today, sensing assurance off to accommodate change. No high ventricular rates noted. Device programmed at appropriate safety margins. Histogram distribution appropriate for patient activity level. Device programmed to optimize intrinsic conduction. Estimated longevity 3.5 years. Patient education completed. ROV with Dr. Lovena Le on 05/30/19.

## 2019-05-30 ENCOUNTER — Other Ambulatory Visit: Payer: Self-pay

## 2019-05-30 ENCOUNTER — Ambulatory Visit (INDEPENDENT_AMBULATORY_CARE_PROVIDER_SITE_OTHER): Payer: Medicare Other | Admitting: Internal Medicine

## 2019-05-30 ENCOUNTER — Encounter: Payer: Self-pay | Admitting: Internal Medicine

## 2019-05-30 VITALS — BP 122/72 | HR 86 | Ht 61.0 in | Wt 81.6 lb

## 2019-05-30 DIAGNOSIS — Z950 Presence of cardiac pacemaker: Secondary | ICD-10-CM

## 2019-05-30 DIAGNOSIS — I441 Atrioventricular block, second degree: Secondary | ICD-10-CM | POA: Diagnosis not present

## 2019-05-30 LAB — CUP PACEART INCLINIC DEVICE CHECK
Battery Impedance: 1763 Ohm
Battery Remaining Longevity: 40 mo
Battery Voltage: 2.76 V
Brady Statistic AP VP Percent: 0 %
Brady Statistic AP VS Percent: 12 %
Brady Statistic AS VP Percent: 0 %
Brady Statistic AS VS Percent: 87 %
Date Time Interrogation Session: 20200928183817
Implantable Lead Implant Date: 20130718
Implantable Lead Implant Date: 20130718
Implantable Lead Location: 753859
Implantable Lead Location: 753860
Implantable Lead Model: 5076
Implantable Lead Model: 5076
Implantable Pulse Generator Implant Date: 20130718
Lead Channel Impedance Value: 282 Ohm
Lead Channel Impedance Value: 341 Ohm
Lead Channel Pacing Threshold Amplitude: 0.5 V
Lead Channel Pacing Threshold Amplitude: 0.5 V
Lead Channel Pacing Threshold Pulse Width: 0.4 ms
Lead Channel Pacing Threshold Pulse Width: 0.4 ms
Lead Channel Sensing Intrinsic Amplitude: 16 mV
Lead Channel Sensing Intrinsic Amplitude: 2.8 mV
Lead Channel Setting Pacing Amplitude: 2 V
Lead Channel Setting Pacing Amplitude: 2.5 V
Lead Channel Setting Pacing Pulse Width: 0.4 ms
Lead Channel Setting Sensing Sensitivity: 5.6 mV

## 2019-05-30 NOTE — Patient Instructions (Signed)
Medication Instructions:  Your physician recommends that you continue on your current medications as directed. Please refer to the Current Medication list given to you today.  Labwork: None ordered.  Testing/Procedures: None ordered.  Follow-Up:  Your physician wants you to follow-up in: 6 months with the device clinic for a pacemaker check.  You will receive a reminder letter in the mail two months in advance. If you don't receive a letter, please call our office to schedule the follow-up appointment.  Your physician wants you to follow-up in: one year with Dr. Taylor.   You will receive a reminder letter in the mail two months in advance. If you don't receive a letter, please call our office to schedule the follow-up appointment.  Any Other Special Instructions Will Be Listed Below (If Applicable).  If you need a refill on your cardiac medications before your next appointment, please call your pharmacy.   

## 2019-05-30 NOTE — Progress Notes (Signed)
HPI Ms. Lancour returns today for ongoing PPM evaluation and management. SHe is a pleasant 85 yo woman with sinus node dysfunction, s/p PPM insertion years ago. She notes that her weight continues to go down gradually although she is actually up a pound from last year.  No Known Allergies   Current Outpatient Medications  Medication Sig Dispense Refill  . Ascorbic Acid (VITAMIN C) 100 MG tablet Take 100 mg by mouth daily.    . feeding supplement (ENSURE COMPLETE) LIQD Take 237 mLs by mouth 3 (three) times daily with meals. 21 Bottle 10  . ferrous sulfate 325 (65 FE) MG tablet Take 1 tablet by mouth daily.     No current facility-administered medications for this visit.      Past Medical History:  Diagnosis Date  . BBB (bundle branch block) 03/18/12   left  . 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 GERD (gastroesophageal reflux disease)   . Pacemaker   . Refusal of blood transfusions as patient is Jehovah's Witness     ROS:   All systems reviewed and negative except as noted in the HPI.   Past Surgical History:  Procedure Laterality Date  . COLON RESECTION N/A 02/10/2013   Procedure: EXPLORATORY LAPAROTOMY, CLOSURE OF ABDOMEN;  Surgeon: [name], MD;  Location: [facility] OR;  Service: General;  Laterality: N/A;  . HERNIA REPAIR    . LAPAROTOMY N/A 02/09/2013   Procedure: Exploratory Laparotomy; Repair of umbilical hernia; repair of obturator hernia; Placement of wound vac; placement of gastrostomy tube;  Surgeon: [name], MD;  Location: [facility] OR;  Service: General;  Laterality: N/A;  . NO PAST SURGERIES    . PACEMAKER INSERTION  03/18/12   initial placement  . PERMANENT PACEMAKER INSERTION N/A 03/18/2012   Procedure: PERMANENT PACEMAKER INSERTION;  Surgeon: Marinus Maw, MD;  Location: [facility] CATH LAB;  Service: Cardiovascular;  Laterality: N/A;     Family History  Problem Relation Age of Onset  . Stroke Father   . Cancer Sister        breast     Social  History   Socioeconomic History  . Marital status: Widowed    Spouse name: Not on file  . Number of children: Not on file  . Years of education: Not on file  . Highest education level: Not on file  Occupational History  . Not on file  Social Needs  . Financial resource strain: Not on file  . Food insecurity    Worry: Not on file    Inability: Not on file  . Transportation needs    Medical: Not on file    Non-medical: Not on file  Tobacco Use  . Smoking status: Never Smoker  . Smokeless tobacco: Never Used  Substance and Sexual Activity  . Alcohol use: No  . Drug use: No  . Sexual activity: Never  Lifestyle  . Physical activity    Days per week: Not on file    Minutes per session: Not on file  . Stress: Not on file  Relationships  . Social Musician on phone: Not on file    Gets together: Not on file    Attends religious service: Not on file    Active member of club or organization: Not on file    Attends meetings of clubs or organizations: Not on file    Relationship status: Not on file  . Intimate partner violence  Fear of current or ex partner: Not on file    Emotionally abused: Not on file    Physically abused: Not on file    Forced sexual activity: Not on file  Other Topics Concern  . Not on file  Social History Narrative  . Not on file     BP 122/72   Pulse 86   Ht 5\' 1"  (1.549 m)   Wt 81 lb 9.6 oz (37 kg)   SpO2 94%   BMI 15.42 kg/m   Physical Exam:  Well appearing NAD HEENT: Unremarkable Neck:  No JVD, no thyromegally Lymphatics:  No adenopathy Back:  No CVA tenderness Lungs:  Clear with no wheezes HEART:  Regular rate rhythm, no murmurs, no rubs, no clicks Abd:  soft, positive bowel sounds, no organomegally, no rebound, no guarding Ext:  2 plus pulses, no edema, no cyanosis, no clubbing Skin:  No rashes no nodules Neuro:  CN II through XII intact, motor grossly intact  EKG - nsr with atrial pacing  DEVICE  Normal device  function.  See PaceArt for details.   Assess/Plan: 1. Sinus node dysfunction - she is asymptomatic, s/p PPM insertion. 2. PPM - her medtronic DDD PM is working normally and has over 3 years on the battery. 3. Weight loss - her weight has stabilized according to our scales. We discussed foods that she likes to eat that my help her gain weight.  Mikle Bosworth.D.

## 2019-06-01 ENCOUNTER — Telehealth: Payer: Self-pay

## 2019-06-01 NOTE — Telephone Encounter (Signed)
-----   Message from Damian Leavell, RN sent at 05/31/2019  8:43 AM EDT ----- Regarding: Pt needs a monitor for remote checks

## 2019-06-01 NOTE — Telephone Encounter (Signed)
I asked the pt daughter do she needs a home monitor for the pt and she states no she already have one. The pt daughter agreed to call me back to get  Assistance to send a manual transmission.

## 2019-11-01 DIAGNOSIS — I495 Sick sinus syndrome: Secondary | ICD-10-CM | POA: Insufficient documentation

## 2019-11-03 ENCOUNTER — Telehealth: Payer: Medicare Other | Admitting: Internal Medicine

## 2019-11-03 ENCOUNTER — Other Ambulatory Visit: Payer: Self-pay

## 2019-11-17 ENCOUNTER — Telehealth (INDEPENDENT_AMBULATORY_CARE_PROVIDER_SITE_OTHER): Payer: Medicare Other | Admitting: Internal Medicine

## 2019-11-17 ENCOUNTER — Other Ambulatory Visit: Payer: Self-pay

## 2019-11-17 VITALS — Ht 61.0 in | Wt 81.0 lb

## 2019-11-17 DIAGNOSIS — Z950 Presence of cardiac pacemaker: Secondary | ICD-10-CM

## 2019-11-17 DIAGNOSIS — I495 Sick sinus syndrome: Secondary | ICD-10-CM | POA: Diagnosis not present

## 2019-11-17 NOTE — Progress Notes (Signed)
Electrophysiology TeleHealth Note   Due to national recommendations of social distancing due to COVID 19, an audio/video telehealth visit is felt to be most appropriate for this patient at this time.  See MyChart message from today for the patient's consent to telehealth for [facility].   Date:  11/17/2019   ID:  Betty [name], DOB 1915/01/22, MRN [id number]  Location: patient's home  Provider location: [address] [place]  Evaluation Performed: Follow-up visit  PCP:  Dorna Mai, MD  Cardiologist:     Electrophysiologist:  GT   Chief Complaint:  Pacemaker for intermittent complete heart block   History of Present Illness:    Betty [name] is a 85 y.o. female who presents via audio/video conferencing for a telehealth visit today.  Since last being seen in our clinic for pacemaker implanted for intermittent Complete AV block  , the patient reports doing quite well.  She is able to transfer from chair to potty, though sometimes associated with tachypalpitations.   Infrequent per her daughter No edema, no chest pain   The patient denies symptoms of fevers, chills, cough, or new SOB worrisome for COVID 19.   Past Medical History:  Diagnosis Date  . BBB (bundle branch block) 03/18/12   left  . Complete AV block ([place]) 03/18/12  . GERD (gastroesophageal reflux disease)   . Pacemaker   . Refusal of blood transfusions as patient is Jehovah's Witness     Past Surgical History:  Procedure Laterality Date  . COLON RESECTION N/A 02/10/2013   Procedure: EXPLORATORY LAPAROTOMY, CLOSURE OF ABDOMEN;  Surgeon: Harl Bowie, MD;  Location: [place];  Service: General;  Laterality: N/A;  . HERNIA REPAIR    . LAPAROTOMY N/A 02/09/2013   Procedure: Exploratory Laparotomy; Repair of umbilical hernia; repair of obturator hernia; Placement of wound vac; placement of gastrostomy tube;  Surgeon: Joyice Faster. Cornett, MD;  Location: [place];  Service: General;  Laterality: N/A;   . NO PAST SURGERIES    . PACEMAKER INSERTION  03/18/12   initial placement  . PERMANENT PACEMAKER INSERTION N/A 03/18/2012   Procedure: PERMANENT PACEMAKER INSERTION;  Surgeon: Evans Lance, MD;  Location: [facility] CATH LAB;  Service: Cardiovascular;  Laterality: N/A;    Current Outpatient Medications  Medication Sig Dispense Refill  . Ascorbic Acid (VITAMIN C) 100 MG tablet Take 100 mg by mouth daily.    . feeding supplement (ENSURE COMPLETE) LIQD Take 237 mLs by mouth 3 (three) times daily with meals. 21 Bottle 10  . ferrous sulfate 325 (65 FE) MG tablet Take 1 tablet by mouth daily.     No current facility-administered medications for this visit.    Allergies:   Patient has no known allergies.   Social History:  The patient  reports that she has never smoked. She has never used smokeless tobacco. She reports that she does not drink alcohol or use drugs.   Family History:  The patient's   family history includes Cancer in her sister; Stroke in her father.   ROS:  Please see the history of present illness.   All other systems are personally reviewed and negative.    Exam:    Vital Signs:  Ht 5\' 1"  (1.549 m)   Wt 81 lb (36.7 kg)   BMI 15.30 kg/m     Well appearing, alert and conversant, regular work of breathing,  good skin color Eyes- anicteric, neuro- grossly intact, skin- no apparent rash  or lesions or cyanosis, mouth- oral mucosa is pink   Labs/Other Tests and Data Reviewed:    Recent Labs: No results found for requested labs within last 8760 hours.   Wt Readings from Last 3 Encounters:  11/17/19 81 lb (36.7 kg)  05/30/19 81 lb 9.6 oz (37 kg)  05/27/18 80 lb 9.6 oz (36.6 kg)     Other studies personally reviewed: Additional studies/ records that were reviewed today include:    Last device remote is reviewed from PaceART PDF dated 9/20  which reveals normal device function,   arrhythmias - none * Battery 3.5 yrs   ASSESSMENT & PLAN:   Complete heart block  intermittent   Pacemaker Medtronic-- Medtronic lost to remote followup  Amazingly functional and cognitively intact "ready to run and play ball" Need to reset her remote monitoring -- maybe we can have office or medtronic call her    COVID 19 screen The patient denies symptoms of COVID 19 at this time.  The importance of social distancing was discussed today.  Follow-up:  57m with GT already scheduled  Next remote:As above   Current medicines are reviewed at length with the patient today.   The patient does not have concerns regarding her medicines.  The following changes were made today:  none  Labs/ tests ordered today include:     No orders of the defined types were placed in this encounter.   Future tests ( post COVID )     Patient Risk:  after full review of this patients clinical status, I feel that they are at moderate risk at this time.  Today, I have spent 10 minutes with the patient with telehealth technology discussing the above.  Signed, Sherryl Manges, MD  11/17/2019 4:23 PM     [facility] [address] [place] [address] [phone] (office) [phone] (fax)

## 2019-11-17 NOTE — Patient Instructions (Signed)
Medication Instructions:  Your physician recommends that you continue on your current medications as directed. Please refer to the Current Medication list given to you today.  Labwork: None ordered.  Testing/Procedures: None ordered.  Follow-Up: Your physician wants you to follow-up [id number] with Dr Lewayne Bunting as scheduled. You will receive a reminder letter in the mail two months in advance. If you don't receive a letter, please call our office to schedule the follow-up appointment.  Remote monitoring is used to monitor your Pacemaker of ICD from home. This monitoring reduces the number of office visits required to check your device to one time per year. It allows [place] to keep an eye on the functioning of your device to ensure it is working properly.  Any Other Special Instructions Will Be Listed Below (If Applicable).  If you need a refill on your cardiac medications before your next appointment, please call your pharmacy.

## 2020-03-14 ENCOUNTER — Encounter (HOSPITAL_COMMUNITY): Payer: Self-pay | Admitting: Emergency Medicine

## 2020-03-14 ENCOUNTER — Inpatient Hospital Stay (HOSPITAL_COMMUNITY)
Admission: EM | Admit: 2020-03-14 | Discharge: 2020-03-20 | DRG: 299 | Disposition: A | Discharge status: Hospice | Payer: Medicare PPO | Attending: Internal Medicine | Admitting: Internal Medicine

## 2020-03-14 ENCOUNTER — Emergency Department (HOSPITAL_COMMUNITY): Payer: Medicare PPO

## 2020-03-14 DIAGNOSIS — E876 Hypokalemia: Secondary | ICD-10-CM | POA: Diagnosis present

## 2020-03-14 DIAGNOSIS — D62 Acute posthemorrhagic anemia: Secondary | ICD-10-CM | POA: Diagnosis present

## 2020-03-14 DIAGNOSIS — E43 Unspecified severe protein-calorie malnutrition: Secondary | ICD-10-CM | POA: Diagnosis present

## 2020-03-14 DIAGNOSIS — I441 Atrioventricular block, second degree: Secondary | ICD-10-CM | POA: Diagnosis present

## 2020-03-14 DIAGNOSIS — L89152 Pressure ulcer of sacral region, stage 2: Secondary | ICD-10-CM | POA: Diagnosis present

## 2020-03-14 DIAGNOSIS — Z888 Allergy status to other drugs, medicaments and biological substances status: Secondary | ICD-10-CM

## 2020-03-14 DIAGNOSIS — Z66 Do not resuscitate: Secondary | ICD-10-CM | POA: Diagnosis not present

## 2020-03-14 DIAGNOSIS — D649 Anemia, unspecified: Secondary | ICD-10-CM | POA: Diagnosis present

## 2020-03-14 DIAGNOSIS — I70261 Atherosclerosis of native arteries of extremities with gangrene, right leg: Principal | ICD-10-CM | POA: Diagnosis present

## 2020-03-14 DIAGNOSIS — M8580 Other specified disorders of bone density and structure, unspecified site: Secondary | ICD-10-CM | POA: Diagnosis present

## 2020-03-14 DIAGNOSIS — N179 Acute kidney failure, unspecified: Secondary | ICD-10-CM | POA: Diagnosis not present

## 2020-03-14 DIAGNOSIS — Z823 Family history of stroke: Secondary | ICD-10-CM | POA: Diagnosis not present

## 2020-03-14 DIAGNOSIS — Z803 Family history of malignant neoplasm of breast: Secondary | ICD-10-CM

## 2020-03-14 DIAGNOSIS — I442 Atrioventricular block, complete: Secondary | ICD-10-CM | POA: Diagnosis present

## 2020-03-14 DIAGNOSIS — Z515 Encounter for palliative care: Secondary | ICD-10-CM

## 2020-03-14 DIAGNOSIS — Z681 Body mass index (BMI) 19 or less, adult: Secondary | ICD-10-CM | POA: Diagnosis not present

## 2020-03-14 DIAGNOSIS — I998 Other disorder of circulatory system: Secondary | ICD-10-CM | POA: Diagnosis present

## 2020-03-14 DIAGNOSIS — I96 Gangrene, not elsewhere classified: Secondary | ICD-10-CM | POA: Diagnosis not present

## 2020-03-14 DIAGNOSIS — Z950 Presence of cardiac pacemaker: Secondary | ICD-10-CM | POA: Diagnosis not present

## 2020-03-14 DIAGNOSIS — I70229 Atherosclerosis of native arteries of extremities with rest pain, unspecified extremity: Secondary | ICD-10-CM | POA: Diagnosis present

## 2020-03-14 DIAGNOSIS — Z20822 Contact with and (suspected) exposure to covid-19: Secondary | ICD-10-CM | POA: Diagnosis present

## 2020-03-14 DIAGNOSIS — I70291 Other atherosclerosis of native arteries of extremities, right leg: Secondary | ICD-10-CM

## 2020-03-14 DIAGNOSIS — K219 Gastro-esophageal reflux disease without esophagitis: Secondary | ICD-10-CM | POA: Diagnosis present

## 2020-03-14 DIAGNOSIS — L8990 Pressure ulcer of unspecified site, unspecified stage: Secondary | ICD-10-CM | POA: Insufficient documentation

## 2020-03-14 DIAGNOSIS — Z531 Procedure and treatment not carried out because of patient's decision for reasons of belief and group pressure: Secondary | ICD-10-CM | POA: Diagnosis present

## 2020-03-14 LAB — SARS CORONAVIRUS 2 BY RT PCR (HOSPITAL ORDER, PERFORMED IN ~~LOC~~ HOSPITAL LAB): SARS Coronavirus 2: NEGATIVE

## 2020-03-14 LAB — COMPREHENSIVE METABOLIC PANEL
ALT: 16 U/L (ref 0–44)
AST: 24 U/L (ref 15–41)
Albumin: 1.9 g/dL — ABNORMAL LOW (ref 3.5–5.0)
Alkaline Phosphatase: 63 U/L (ref 38–126)
Anion gap: 9 (ref 5–15)
BUN: 17 mg/dL (ref 8–23)
CO2: 26 mmol/L (ref 22–32)
Calcium: 8.2 mg/dL — ABNORMAL LOW (ref 8.9–10.3)
Chloride: 111 mmol/L (ref 98–111)
Creatinine, Ser: 1.14 mg/dL — ABNORMAL HIGH (ref 0.44–1.00)
GFR calc Af Amer: 45 mL/min — ABNORMAL LOW (ref >60)
GFR calc non Af Amer: 39 mL/min — ABNORMAL LOW (ref >60)
Glucose, Bld: 94 mg/dL (ref 70–99)
Potassium: 3.5 mmol/L (ref 3.5–5.1)
Sodium: 146 mmol/L — ABNORMAL HIGH (ref 135–145)
Total Bilirubin: 1 mg/dL (ref 0.3–1.2)
Total Protein: 5.9 g/dL — ABNORMAL LOW (ref 6.5–8.1)

## 2020-03-14 LAB — CBC WITH DIFFERENTIAL/PLATELET
Abs Immature Granulocytes: 0.04 10*3/uL (ref 0.00–0.07)
Basophils Absolute: 0 10*3/uL (ref 0.0–0.1)
Basophils Relative: 0 %
Eosinophils Absolute: 0 10*3/uL (ref 0.0–0.5)
Eosinophils Relative: 0 %
HCT: 22.4 % — ABNORMAL LOW (ref 36.0–46.0)
Hemoglobin: 6.8 g/dL — CL (ref 12.0–15.0)
Immature Granulocytes: 0 %
Lymphocytes Relative: 9 %
Lymphs Abs: 0.8 10*3/uL (ref 0.7–4.0)
MCH: 29.4 pg (ref 26.0–34.0)
MCHC: 30.4 g/dL (ref 30.0–36.0)
MCV: 97 fL (ref 80.0–100.0)
Monocytes Absolute: 0.5 10*3/uL (ref 0.1–1.0)
Monocytes Relative: 5 %
Neutro Abs: 7.7 10*3/uL (ref 1.7–7.7)
Neutrophils Relative %: 86 %
Platelets: 285 10*3/uL (ref 150–400)
RBC: 2.31 MIL/uL — ABNORMAL LOW (ref 3.87–5.11)
RDW: 23.1 % — ABNORMAL HIGH (ref 11.5–15.5)
WBC: 9.1 10*3/uL (ref 4.0–10.5)
nRBC: 0 % (ref 0.0–0.2)

## 2020-03-14 LAB — PROTIME-INR
INR: 1.2 (ref 0.8–1.2)
Prothrombin Time: 14.8 seconds (ref 11.4–15.2)

## 2020-03-14 LAB — LACTIC ACID, PLASMA: Lactic Acid, Venous: 1.5 mmol/L (ref 0.5–1.9)

## 2020-03-14 MED ORDER — SODIUM CHLORIDE 0.9 % IV SOLN
510.0000 mg | Freq: Once | INTRAVENOUS | Status: AC
  Administered 2020-03-14: 510 mg via INTRAVENOUS
  Filled 2020-03-14: qty 17

## 2020-03-14 MED ORDER — VANCOMYCIN HCL IN DEXTROSE 1-5 GM/200ML-% IV SOLN
1000.0000 mg | Freq: Once | INTRAVENOUS | Status: AC
  Administered 2020-03-14: 1000 mg via INTRAVENOUS
  Filled 2020-03-14: qty 200

## 2020-03-14 MED ORDER — PIPERACILLIN-TAZOBACTAM 3.375 G IVPB 30 MIN
3.3750 g | Freq: Once | INTRAVENOUS | Status: AC
  Administered 2020-03-14: 3.375 g via INTRAVENOUS
  Filled 2020-03-14: qty 50

## 2020-03-14 MED ORDER — OXYCODONE HCL 5 MG PO TABS
5.0000 mg | ORAL_TABLET | ORAL | Status: DC | PRN
  Administered 2020-03-15 – 2020-03-16 (×2): 5 mg via ORAL
  Filled 2020-03-14 (×2): qty 1

## 2020-03-14 MED ORDER — ACETAMINOPHEN 650 MG RE SUPP: 650.0000 mg | Freq: Four times a day (QID) | RECTAL | Status: DC | PRN

## 2020-03-14 MED ORDER — ACETAMINOPHEN 325 MG PO TABS
650.0000 mg | ORAL_TABLET | Freq: Four times a day (QID) | ORAL | Status: DC | PRN
  Administered 2020-03-14 – 2020-03-20 (×6): 650 mg via ORAL
  Filled 2020-03-14 (×7): qty 2

## 2020-03-14 NOTE — Consult Note (Signed)
[facility] [place]  Patient name: Betty [name] MRN: [id number] DOB: April 08, 1915 Sex: female    HPI: Betty [name] is a 86 y.o. female seen in the emergency department for right foot ischemia.  She is a delightful 86 year old female.  Her daughter is present to help with her history as well.  Patient reports that she has lived with her daughter for the past 14 years.  She has been fortunate in that she has not had any pain.  Over the last several weeks she has had severe pain in her right foot.  She was admitted to an outlying hospital on approximately June 30 and was discharged approximately July 5.  I do not have records of this.  Apparently she was seen in consultation with a vascular specialist at the outlying hospital and underwent right leg arteriography via left femoral approach.  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  The patient was brought to [facility] emergency room tonight for further evaluation.  She does not walk.  She does stand with assistance to go from bed to potty chair at the bedside.  She reports significant rest pain over the past several weeks.  Past Medical History:  Diagnosis Date  . BBB (bundle branch block) 03/18/12   left  . 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 GERD (gastroesophageal reflux disease)   . Pacemaker   . Refusal of blood transfusions as patient is Jehovah's Witness     Family History  Problem Relation Age of Onset  . Stroke Father   . Cancer Sister        breast    SOCIAL HISTORY: Social History   Tobacco Use  . Smoking status: Never Smoker  . Smokeless tobacco: Never Used  Substance Use Topics  . Alcohol use: No    Allergies  Allergen Reactions  . Lipitor [Atorvastatin] Other (See Comments)    Made the patient's throat burn    Current Facility-Administered Medications   Medication Dose Route Frequency Provider Last Rate Last Admin  . acetaminophen (TYLENOL) tablet 650 mg  650 mg Oral Q6H PRN Claudean Severance, MD   650 mg at 03/14/20 2000   Or  . acetaminophen (TYLENOL) suppository 650 mg  650 mg Rectal Q6H PRN Claudean Severance, MD      . ferumoxytol [facility]) 510 mg in sodium chloride 0.9 % 100 mL IVPB  510 mg Intravenous Once Merrilyn Puma, MD      . oxyCODONE (Oxy IR/ROXICODONE) immediate release tablet 5 mg  5 mg Oral Q4H PRN Merrilyn Puma, MD       Current Outpatient Medications  Medication Sig Dispense Refill  . acetaminophen (TYLENOL) 325 MG tablet Take 650 mg by mouth every 6 (six) hours as needed for mild pain.    Marland Kitchen ascorbic acid (VITAMIN C) 500 MG tablet Take 500 mg by mouth daily.    . Ensure Plus (ENSURE PLUS) LIQD Take 237 mLs by mouth 2 (two) times daily between meals.    . ferrous sulfate 325 (65 FE) MG tablet Take 325 mg by mouth daily with breakfast.     . atorvastatin (LIPITOR) 40 MG tablet Take 40 mg by mouth. (Patient not taking: Reported on 03/14/2020)    . feeding supplement (ENSURE COMPLETE) LIQD Take 237 mLs by mouth 3 (three) times daily with meals. (Patient not taking: Reported on 03/14/2020) 21  Bottle 10    REVIEW OF SYSTEMS:  Reviewed in her history and physical with nothing to add.  Pacemaker present.  PHYSICAL EXAM: Vitals:   03/14/20 1524 03/14/20 1915 03/14/20 1930  BP: 115/60    Pulse: 83 65 62  Resp: 16    Temp: 98.7 F (37.1 C)    TempSrc: Oral    SpO2: 100% 100% 99%  Weight: 38.6 kg    Height: 5\' 1"  (1.549 m)      GENERAL: The patient is a well-nourished female, in no acute distress. The vital signs are documented above. CARDIOVASCULAR: Absent right radial pulse.  2+ left radial pulse.  2+ femoral pulses bilaterally.  Absent popliteal and distal pulses bilaterally. PULMONARY: There is good air exchange  MUSCULOSKELETAL: There are no major deformities or cyanosis. NEUROLOGIC: No focal weakness or  paresthesias are detected. 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  Also dry gangrenous changes on her proximal second toe and in the web space extending onto the dorsum of her foot between her first and second toe. PSYCHIATRIC: The patient has a normal affect.  DATA:  Noninvasive vascular lab studies pending  MEDICAL ISSUES: I had a very long discussion with the patient and her daughter present.  She clearly has gangrene of her right great toe.  Does not have adequate arterial flow for healing this.  Explained extremely high risk for higher level of amputation either below-knee or above-knee.  Does not appear to be a candidate for aggressive revascularization.  She does have extensive tissue loss already and her right foot and is extremely frail.  Also is a Witness and refuses transfusion and has a hemoglobin of 6.  Her creatinine is normal.  Will attempt to obtain results from outlying hospital tomorrow to make further recommendation.  Orthopedics has been consulted as well regarding potential amputation    TEFL teacher, MD FACS [facility] Tel [phone] Pager [phone]

## 2020-03-14 NOTE — ED Notes (Signed)
Pt 1st Lactic Acid normal. 2nd to be discontinued. 

## 2020-03-14 NOTE — ED Notes (Addendum)
MD Adela Lank made aware of Hgb 6.8. Provider at bed side.

## 2020-03-14 NOTE — H&P (Addendum)
Date: 03/14/2020               Patient Name:  Betty [name] MRN: [id number]  DOB: 1914/09/14 Age / Sex: 86 y.o., female   PCP: Georganna Skeans, MD         Medical Service: Internal Medicine Teaching Service         Attending Physician: Dr. Gust Rung, DO    First Contact: Dr. Austin Miles Pager: [phone]  Second Contact: Dr. Gwyneth Revels Pager: [phone]       After Hours (After 5p/  First Contact Pager: [phone]  weekends / holidays): Second Contact Pager: [phone]   Chief Complaint: Right toe pain  History of Present Illness: This is a 86 year old female with a history of mobitz type 2 s/p pacemaker presenting with right great toe pain.   Patients daughter at bedside. She assisted with story. 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   Reports pain started about 2.5 weeks ago, then went to a vascular surgeon and had a procedure. She lost a lot of blood during the procedure. Stayed in the hospital for 1 week then came home on the 5th of July. The plan was to return to that hospital to have the vessel opened up however was not able to do it because of the risk associated with her low blood counts and age.  Reports right great toe began to have a red discoloration about 2 weeks ago. Reports sensation at the right great toe began to decrease around a week ago which was around the same time when black discoloration at the tip of the toe was first noticed by daughter. Darkness at the tip then progressed towards the base of the great toe and the 2nd toe.   At baseline she has been unable to walk around for the last year and mostly stays in bed, only getting up to use the toilet which is located by her bed. She gets around in a wheelchair.  Daughter reports a previous hospital admission for anemia with a hemoglobin level of around 5. During that stay, she received iron injections which helped. Daughter states they are okay with iron injections if needed during this hospital  stay.  Her right distal middle finger has been amputated. Daughter reports she is unsure why but states it was done after white discoloration appeared at that location of the finger. Her left distal pinky and ring fingers were amputated in an accidental injury when she was a child.  Currently, patient reports that her foot is not hurting. Was wanting to sit up in bed for comfort.  Code status discussed. Daughter reports that patient would like "everything within reason as long as it is not too much that she would be hurting". She continued "she would want everything within reason, until the physicians would know when to stop." Has a document in the car that states "if there is a chance, she would want something done." Further details about the risks and benefits of resuscitative interventions discussed. Patient and daughter would like to keep code status as Full Code.  In the ED patient was noted to be hemodynamically stable. Labs significant for Hgb 6.8, cr 1.14, no leukocytosis. X-ray showed soft tissue thinning at the distal margin of the first and second digits, compatible with discoloration and tissue necrosis, possible subcutaneous gas at the distal tuft of the first digit, diffuse osteopenia.   Meds:  Current Meds  Medication Sig  acetaminophen (TYLENOL) 325 MG tablet Take 650 mg by mouth every 6 (six) hours as needed for mild pain.   ascorbic acid (VITAMIN C) 500 MG tablet Take 500 mg by mouth daily.   Ensure Plus (ENSURE PLUS) LIQD Take 237 mLs by mouth 2 (two) times daily between meals.   ferrous sulfate 325 (65 FE) MG tablet Take 325 mg by mouth daily with breakfast.      Allergies: Allergies as of 03/14/2020 - Review Complete 03/14/2020  Allergen Reaction Noted   Lipitor [atorvastatin] Other (See Comments) 03/14/2020   Past Medical History:  Diagnosis Date   BBB (bundle branch block) 03/18/12   left   Complete AV block (HCC) 03/18/12   GERD (gastroesophageal reflux  disease)    Pacemaker    Refusal of blood transfusions as patient is Jehovah's Witness     Family History:  Family History  Problem Relation Age of Onset   Stroke Father    Cancer Sister        breast    Social History: Denies any smoking, EtOH, or drug use. Lives with daughter since 2007 in [place]. She is a TEFL teacher witness.   Review of Systems: A complete ROS was negative except as per HPI.   Physical Exam: Blood pressure 115/60, pulse 62, temperature 98.7 F (37.1 C), temperature source Oral, resp. rate 16, height 5\' 1"  (1.549 m), weight 38.6 kg, SpO2 99 %. Physical Exam Constitutional:      Comments: Pleasant elderly female, NAD, frail appearing  HENT:     Head: Normocephalic and atraumatic.     Mouth/Throat:     Mouth: Mucous membranes are dry.  Eyes:     Comments: Pale conjunctiva, otherwise normal.  Cardiovascular:     Rate and Rhythm: Normal rate and regular rhythm.     Comments: Absent dorsalis pedis and posterior tibialis pulses in the RLE. Faint dorsalis pedis and posterior tibialis pulses in the LLE. 2+ radial pulses in bilateral upper extremities. Pulmonary:     Effort: Pulmonary effort is normal. No respiratory distress.     Breath sounds: Normal breath sounds. No stridor. No wheezing.  Abdominal:     General: Bowel sounds are normal. There is no distension.     Palpations: Abdomen is soft.     Tenderness: There is no abdominal tenderness. There is no guarding or rebound.  Musculoskeletal:     Comments: Trace edema in RLE up to ankles. No tenderness to palpation at area of necrosis or in the rest of her RLE. No tenderness to palpation in bilateral calves.  Skin:    Comments: Right great toe has black discoloration extending to the base of the toe on the dorsal aspect and extending midway through plantar aspect of toe. There is redness surrounding the area of necrosis. On the dorsal aspect of the foot (at the base of the great toe and 2nd digit,  there is an area of superficial erosion. Please see the attached images. The area of necrosis is hardened to the touch. Skin on bilateral lower extremities is cool to the touch.  Neurological:     General: No focal deficit present.     Mental Status: Mental status is at baseline.     Comments: Sensation in 1st and 2nd digits of RLE is absent. Sensation is intact in the rest of her right foot. She is able to wiggle her toes bilaterally. Strength is 5/5 in lower extremities. Strength and sensation are intact in LLE and bilateral  UE.  Psychiatric:        Mood and Affect: Mood normal.        Behavior: Behavior normal.      CBC    Component Value Date/Time   WBC 9.1 03/14/2020 1547   RBC 2.31 (L) 03/14/2020 1547   HGB 6.8 (LL) 03/14/2020 1547   HCT 22.4 (L) 03/14/2020 1547   PLT 285 03/14/2020 1547   MCV 97.0 03/14/2020 1547   MCH 29.4 03/14/2020 1547   MCHC 30.4 03/14/2020 1547   RDW 23.1 (H) 03/14/2020 1547   LYMPHSABS 0.8 03/14/2020 1547   MONOABS 0.5 03/14/2020 1547   EOSABS 0.0 03/14/2020 1547   BASOSABS 0.0 03/14/2020 1547   CMP     Component Value Date/Time   NA 146 (H) 03/14/2020 1547   K 3.5 03/14/2020 1547   CL 111 03/14/2020 1547   CO2 26 03/14/2020 1547   GLUCOSE 94 03/14/2020 1547   BUN 17 03/14/2020 1547   CREATININE 1.14 (H) 03/14/2020 1547   CALCIUM 8.2 (L) 03/14/2020 1547   PROT 5.9 (L) 03/14/2020 1547   ALBUMIN 1.9 (L) 03/14/2020 1547   AST 24 03/14/2020 1547   ALT 16 03/14/2020 1547   ALKPHOS 63 03/14/2020 1547   BILITOT 1.0 03/14/2020 1547   GFRNONAA 39 (L) 03/14/2020 1547   GFRAA 45 (L) 03/14/2020 1547   Lactic acid - 1.5  PT - 14.8 INR - 1.2   EKG: None  Right foot x-ray: IMPRESSION: 1. Soft tissue thinning at the distal margin of the first and second digits, compatible with given history of discoloration and soft tissue necrosis. Possible subcutaneous gas at the distal tuft of the first digit. 2. Diffuse osteopenia.  No acute or  destructive bony lesions.  Assessment & Plan by Problem: Active Problems:   Critical lower limb ischemia   This is a 86 year old female with a history of mobitz type 2 s/p pacemaker who presented with right great toe pain noted to have critical limb ischemia.     Critical limb ischemia: Recent balloon angioplasty. Concern at outpatient hospital about placing a stent since patient is a Jehovah's witness with severe anemia. Absent pulses in right foot with necrosis of the 1st and second toe.  -Orthopedics consulted, will see in AM -Will consult vascular -Ordered ABI -Continue vanc and zosyn as empiric coverage -Tylenol PRN, oxycodone 5mg  for severe and breakthrough pain.  Anemia: Hgb down to 6.8 today. VSS today. Patient is a witness and reported that she does not want blood products. As per her daughter, her Hgb tends to stay low. -Continue iron supplementation; ordered one dose of IV feraheme on 03/14/20  Mobitz type 2 s/p pacemaker: HR 83. -f/u EKG   Dispo: Admit patient to Inpatient with expected length of stay greater than 2 midnights.  Signed: 03/16/20, MD 03/14/2020, 7:53 PM  Pager: [phone] After 5pm on weekdays and 1pm on weekends: On Call pager: [phone]

## 2020-03-14 NOTE — ED Provider Notes (Signed)
[facility] EMERGENCY DEPARTMENT Provider Note   CSN: [id number] Arrival date & time: 03/14/20  1450     History Chief Complaint  Patient presents with  . Toe Pain    Betty [name] is a 86 y.o. female.  86 yo F with a chief complaints of right great toe pain.  Going on for at least the past couple weeks.  The patient went to an outside hospital where she had what sounds like an angiogram of the right lower extremity.  They may have performed a balloon angioplasty.  They were concerned about actually putting a stent in the patient is a Jehovah's Witness and will not receive blood if she needed it.  She was then discharged home.  Patient felt like her pain to her toes gotten worse.  She ended up coming here because she had had a pacemaker placed at this facility and also had a prior abdominal surgery done here.  They deny fever.  Denies trauma.  The history is provided by the patient.  Toe Pain This is a new problem. The current episode started more than 1 week ago. The problem occurs constantly. The problem has not changed since onset.Pertinent negatives include no chest pain, no abdominal pain, no headaches and no shortness of breath. Nothing aggravates the symptoms. Nothing relieves the symptoms. She has tried nothing for the symptoms. The treatment provided no relief.       Past Medical History:  Diagnosis Date  . BBB (bundle branch block) 03/18/12   left  . 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 GERD (gastroesophageal reflux disease)   . Pacemaker   . Refusal of blood transfusions as patient is Jehovah's Witness     Patient Active Problem List   Diagnosis Date Noted  . Sinus node dysfunction (HCC) 11/01/2019  . Volume overload 02/23/2013  . Acute diastolic heart failure (HCC) 02/23/2013  . Hypernatremia 02/15/2013  . Septic shock(785.52) 02/11/2013  . Intestinal ischemia (HCC) 02/10/2013  . Unspecified protein-calorie malnutrition (HCC) 02/10/2013  .  Incarcerated hernia 02/09/2013  . Acute respiratory failure (HCC) 02/09/2013  . Acute encephalopathy 02/09/2013  . Hyperglycemia 02/09/2013  . Protein-calorie malnutrition, severe (HCC) 02/09/2013  . Mobitz type II atrioventricular block 06/29/2012  . Pacemaker-[employer] 03/19/2012    Past Surgical History:  Procedure Laterality Date  . COLON RESECTION N/A 02/10/2013   Procedure: EXPLORATORY LAPAROTOMY, CLOSURE OF ABDOMEN;  Surgeon: [name], MD;  Location: [facility] OR;  Service: General;  Laterality: N/A;  . HERNIA REPAIR    . LAPAROTOMY N/A 02/09/2013   Procedure: Exploratory Laparotomy; Repair of umbilical hernia; repair of obturator hernia; Placement of wound vac; placement of gastrostomy tube;  Surgeon: [name], MD;  Location: [facility] OR;  Service: General;  Laterality: N/A;  . NO PAST SURGERIES    . PACEMAKER INSERTION  03/18/12   initial placement  . PERMANENT PACEMAKER INSERTION N/A 03/18/2012   Procedure: PERMANENT PACEMAKER INSERTION;  Surgeon: Marinus Maw, MD;  Location: [facility] CATH LAB;  Service: Cardiovascular;  Laterality: N/A;     OB History   No obstetric history on file.     Family History  Problem Relation Age of Onset  . Stroke Father   . Cancer Sister        breast    Social History   Tobacco Use  . Smoking status: Never Smoker  . Smokeless tobacco: Never Used  Substance Use Topics  . Alcohol use: No  . Drug use:  No    Home Medications Prior to Admission medications   Medication Sig Start Date End Date Taking? Authorizing Provider  Ascorbic Acid (VITAMIN C) 100 MG tablet Take 100 mg by mouth daily.    [provider]  atorvastatin (LIPITOR) 40 MG tablet Take 40 mg by mouth. 03/08/20   [provider]  feeding supplement (ENSURE COMPLETE) LIQD Take 237 mLs by mouth 3 (three) times daily with meals. 03/01/13   Nonie Hoyer, PA-C  ferrous sulfate 325 (65 FE) MG tablet Take 1 tablet by mouth daily. 09/08/18   [provider]    Allergies    Patient has no known allergies.  Review of Systems   Review of Systems  Constitutional: Negative for chills and fever.  HENT: Negative for congestion and rhinorrhea.   Eyes: Negative for redness and visual disturbance.  Respiratory: Negative for shortness of breath and wheezing.   Cardiovascular: Negative for chest pain and palpitations.  Gastrointestinal: Negative for abdominal pain, nausea and vomiting.  Genitourinary: Negative for dysuria and urgency.  Musculoskeletal: Positive for arthralgias. Negative for myalgias.  Skin: Negative for pallor and wound.  Neurological: Negative for dizziness and headaches.    Physical Exam Updated Vital Signs BP 115/60 (BP Location: Right Arm)   Pulse 83   Temp 98.7 F (37.1 C) (Oral)   Resp 16   Ht 5\' 1"  (1.549 m)   Wt 38.6 kg   SpO2 100%   BMI 16.06 kg/m   Physical Exam Vitals and nursing note reviewed.  Constitutional:      General: She is not in acute distress.    Appearance: She is well-developed. She is not diaphoretic.  HENT:     Head: Normocephalic and atraumatic.  Eyes:     Pupils: Pupils are equal, round, and reactive to light.  Cardiovascular:     Rate and Rhythm: Normal rate and regular rhythm.     Heart sounds: No murmur heard.  No friction rub. No gallop.   Pulmonary:     Effort: Pulmonary effort is normal.     Breath sounds: No wheezing or rales.  Abdominal:     General: There is no distension.     Palpations: Abdomen is soft.     Tenderness: There is no abdominal tenderness.  Musculoskeletal:        General: Tenderness present.     Cervical back: Normal range of motion and neck supple.     Comments: Right great toe is necrotic with some extension to the medial aspect of the right second digit.  Stops at the MTP.  No obvious palpable dorsalis pedis or posterior tibialis pulse to that foot.  No palpable pulse to the other foot though the other foot is slightly warmer.  Femoral pulses  bilaterally.  Skin:    General: Skin is warm and dry.  Neurological:     Mental Status: She is alert and oriented to person, place, and time.  Psychiatric:        Behavior: Behavior normal.     ED Results / Procedures / Treatments   Labs (all labs ordered are listed, but only abnormal results are displayed) Labs Reviewed  COMPREHENSIVE METABOLIC PANEL - Abnormal; Notable for the following components:      Result Value   Sodium 146 (*)    Creatinine, Ser 1.14 (*)    Calcium 8.2 (*)    Total Protein 5.9 (*)    Albumin 1.9 (*)    GFR calc non  Af Amer 39 (*)    GFR calc Af Amer 45 (*)    All other components within normal limits  CBC WITH DIFFERENTIAL/PLATELET - Abnormal; Notable for the following components:   RBC 2.31 (*)    Hemoglobin 6.8 (*)    HCT 22.4 (*)    RDW 23.1 (*)    All other components within normal limits  CULTURE, BLOOD (ROUTINE X 2)  CULTURE, BLOOD (ROUTINE X 2)  SARS CORONAVIRUS 2 BY RT PCR (HOSPITAL ORDER, PERFORMED IN  HOSPITAL LAB)  LACTIC ACID, PLASMA  PROTIME-INR  LACTIC ACID, PLASMA    EKG None  Radiology No results found.  Procedures Procedures (including critical care time)  Medications Ordered in ED Medications  piperacillin-tazobactam (ZOSYN) IVPB 3.375 g (has no administration in time range)  vancomycin (VANCOCIN) IVPB 1000 mg/200 mL premix (has no administration in time range)    ED Course  I have reviewed the triage vital signs and the nursing notes.  Pertinent labs & imaging results that were available during my care of the patient were reviewed by me and considered in my medical decision making (see chart for details).    MDM Rules/Calculators/A&P                          86 yo F with a chief complaints of necrotic great toe on the right foot.  This is been an ongoing issue for at least the past couple weeks.  The patient went to [facility] in [facility] and had a vascular procedure  performed.  Sounds like an angiogram with possible balloon angioplasty.  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  She has been chronically anemic per the family.  They told her at discharge that her hemoglobin was stable but it was lower than is typical.  There is talk of doing a stent for the right leg but they decided that the risk would be too great with her anemia and inability to transfuse blood if they needed to.  We will obtain an x-ray of the foot.  We will start on antibiotics.  We will discuss with Ortho.  Discussed with Dr. August Saucer, orthopedics.  He will send the information to Dr. Lajoyce Corners to consult on the patient tomorrow.  Recommended hospitalist admission.  Will start on IV antibiotics.  The patients results and plan were reviewed and discussed.   Any x-rays performed were independently reviewed by myself.   Differential diagnosis were considered with the presenting HPI.  Medications  piperacillin-tazobactam (ZOSYN) IVPB 3.375 g (has no administration in time range)  vancomycin (VANCOCIN) IVPB 1000 mg/200 mL premix (has no administration in time range)    Vitals:   03/14/20 1524  BP: 115/60  Pulse: 83  Resp: 16  Temp: 98.7 F (37.1 C)  TempSrc: Oral  SpO2: 100%  Weight: 38.6 kg  Height: 5\' 1"  (1.549 m)    Final diagnoses:  Toe necrosis [facility])    Admission/ observation were discussed with the admitting physician, patient and/or family and they are comfortable with the plan.   Final Clinical Impression(s) / ED Diagnoses Final diagnoses:  Toe necrosis [facility])    Rx / DC Orders ED Discharge Orders    None       [facility], DO 03/14/20 1656

## 2020-03-14 NOTE — Hospital Course (Addendum)
Reports pain on the top of her first 2 toes Pain medicine requested and on the way Daughter in the room with her said Hospice cannot start seeing her until tomorrow but daughter says her brother and son are nearby to be able to help today. Discussed sending her home with pain meds incase of pain and she said that was fine with her.

## 2020-03-14 NOTE — ED Triage Notes (Signed)
Patient in POV with daughter. Reports R great toe pain X several weeks. Has been seen by podiatry and vascular where multiple tests were performed but daughter is unsure of what results were. Patient presents with redness and swelling to RLE. R great toe and R second toe black.

## 2020-03-15 ENCOUNTER — Inpatient Hospital Stay (HOSPITAL_COMMUNITY): Payer: Medicare PPO

## 2020-03-15 DIAGNOSIS — I96 Gangrene, not elsewhere classified: Secondary | ICD-10-CM

## 2020-03-15 DIAGNOSIS — I998 Other disorder of circulatory system: Secondary | ICD-10-CM

## 2020-03-15 LAB — BASIC METABOLIC PANEL
Anion gap: 11 (ref 5–15)
BUN: 17 mg/dL (ref 8–23)
CO2: 24 mmol/L (ref 22–32)
Calcium: 8 mg/dL — ABNORMAL LOW (ref 8.9–10.3)
Chloride: 111 mmol/L (ref 98–111)
Creatinine, Ser: 1.22 mg/dL — ABNORMAL HIGH (ref 0.44–1.00)
GFR calc Af Amer: 41 mL/min — ABNORMAL LOW (ref >60)
GFR calc non Af Amer: 36 mL/min — ABNORMAL LOW (ref >60)
Glucose, Bld: 80 mg/dL (ref 70–99)
Potassium: 3.2 mmol/L — ABNORMAL LOW (ref 3.5–5.1)
Sodium: 146 mmol/L — ABNORMAL HIGH (ref 135–145)

## 2020-03-15 LAB — CBC
HCT: 21 % — ABNORMAL LOW (ref 36.0–46.0)
Hemoglobin: 6.5 g/dL — CL (ref 12.0–15.0)
MCH: 29.4 pg (ref 26.0–34.0)
MCHC: 31 g/dL (ref 30.0–36.0)
MCV: 95 fL (ref 80.0–100.0)
Platelets: 244 10*3/uL (ref 150–400)
RBC: 2.21 MIL/uL — ABNORMAL LOW (ref 3.87–5.11)
RDW: 23.6 % — ABNORMAL HIGH (ref 11.5–15.5)
WBC: 9.7 10*3/uL (ref 4.0–10.5)
nRBC: 0.2 % (ref 0.0–0.2)

## 2020-03-15 LAB — VITAMIN B12: Vitamin B-12: 1926 pg/mL — ABNORMAL HIGH (ref 180–914)

## 2020-03-15 LAB — CBG MONITORING, ED
Glucose-Capillary: 68 mg/dL — ABNORMAL LOW (ref 70–99)
Glucose-Capillary: 87 mg/dL (ref 70–99)

## 2020-03-15 MED ORDER — DEXTROSE IN LACTATED RINGERS 5 % IV SOLN: INTRAVENOUS | Status: DC

## 2020-03-15 MED ORDER — DEXTROSE 50 % IV SOLN
INTRAVENOUS | Status: AC
  Filled 2020-03-15: qty 50

## 2020-03-15 MED ORDER — METRONIDAZOLE 500 MG PO TABS: 500.0000 mg | ORAL_TABLET | Freq: Three times a day (TID) | ORAL | Status: DC

## 2020-03-15 MED ORDER — POTASSIUM CHLORIDE 20 MEQ PO PACK
40.0000 meq | PACK | ORAL | Status: AC
  Administered 2020-03-15: 40 meq via ORAL
  Filled 2020-03-15: qty 2

## 2020-03-15 MED ORDER — VANCOMYCIN VARIABLE DOSE PER UNSTABLE RENAL FUNCTION (PHARMACIST DOSING): Status: DC

## 2020-03-15 MED ORDER — PIPERACILLIN-TAZOBACTAM 3.375 G IVPB
3.3750 g | Freq: Two times a day (BID) | INTRAVENOUS | Status: DC
  Administered 2020-03-15 – 2020-03-16 (×2): 3.375 g via INTRAVENOUS
  Filled 2020-03-15 (×2): qty 50

## 2020-03-15 NOTE — ED Notes (Signed)
Pt takes her meds crushed in applesauce

## 2020-03-15 NOTE — Progress Notes (Signed)
Subjective: Examined patient at bedside.  Patient states that she feels good. Daughter states that patient received PO pain meds at 0530.  Informed patient and patient's daughter of plan. Daughter stated that orthopedics came to see her. Don't recommend doing surgery now because of low blood counts. Discussed with patient that we gave her some iron yesterday to see if her blood counts will rise.   Patient and patient's daughter understand the plan. No questions at this time.  Objective:  Vital signs in last 24 hours: Vitals:   03/15/20 0430 03/15/20 0445 03/15/20 0815 03/15/20 1425  BP: (!) 121/34 113/66 (!) 116/59   Pulse: 67 76 77 70  Resp: (!) 21 (!) 21 17 20   Temp:   (!) 97.4 F (36.3 C)   TempSrc:   Oral   SpO2: 99% 99% 100% 96%  Weight:      Height:       Physical Exam Constitutional:      Comments: Pleasant elderly female, NAD, frail appearing  HENT:     Mouth: Mucous membranes are dry.  Eyes:     Comments: Pale conjunctiva, otherwise normal.  Cardiovascular:     Rate and Rhythm: Normal rate and regular rhythm.     Comments: Absent dorsalis pedis and posterior tibialis pulses in the RLE. Faint dorsalis pedis and posterior tibialis pulses in the LLE. 2+ radial pulses in bilateral upper extremities. Pulmonary:     Effort: Pulmonary effort is normal. No respiratory distress.     Breath sounds: Normal breath sounds. No stridor. No wheezing.  Abdominal:     General: Bowel sounds are normal. There is no distension.     Palpations: Abdomen is soft.     Tenderness: There is no abdominal tenderness. There is no guarding or rebound.  Musculoskeletal:     Comments: No tenderness to palpation at area of necrosis or in the rest of her RLE. No tenderness to palpation in bilateral calves.  Skin:    Comments: Right great toe has black discoloration extending to the base of the toe on the dorsal aspect and extending midway through plantar aspect of toe. There is redness  surrounding the area of necrosis. On the dorsal aspect of the foot (at the base of the great toe and 2nd digit), there is an area of superficial erosion. The area of necrosis is hardened to the touch. Neurological:     General: No focal deficit present.     Mental Status: Mental status is at baseline.     Comments: Sensation in 1st and 2nd digits of RLE is absent. Sensation is intact in the rest of her right foot.   Assessment/Plan:  Active Problems:   Critical lower limb ischemia  This is a 86 year old female with a history of mobitz type 2 s/p pacemaker who presented with right great toe pain noted to have critical limb ischemia.    Gangrene of right first toe with critical limb ischemia: Recent balloon angioplasty. Concern at outpatient hospital about placing a stent since patient is a Jehovah's witness with severe anemia. Absent pulses in right foot with necrosis of the 1st and second toe.  -As per orthopedics and vascular, patient would be high risk for surgery given her anemia. -ABI pending -Continue vanc and zosyn as empiric coverage -Tylenol PRN, oxycodone 5mg  for severe and breakthrough pain. -Palliative consulted for goals of care and symptomatic management as patient may not be able to undergo amputation if Hgb remains low.  Anemia:  Hgb down to 6.8 today. VSS today. 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 As per her daughter, her Hgb tends to stay low. -given one dose of IV feraheme on 03/14/20 -f/u Vitamin B12 and MMA labs  Malnutrition: -Nutrition consulted  Hypokalemia: -Mild hypokalemia (3.2) --> repleted with potassium chloride packet  Mobitz type 2 s/p pacemaker: HR 83. -f/u EKG   Prior to Admission Living Arrangement: Home Anticipated Discharge Location: TBD Barriers to Discharge: pending medical workup Dispo: pending medical workup   Merrilyn Puma, MD 03/15/2020, 3:18 PM Pager: [phone] After 5pm  on weekdays and 1pm on weekends: On Call pager [phone]

## 2020-03-15 NOTE — Consult Note (Signed)
ORTHOPAEDIC CONSULTATION  REQUESTING PHYSICIAN: Gust Rung, DO  Chief Complaint: Painful dry gangrenous right great toe.  HPI: Betty [name] is a 86 y.o. female who presents with dry gangrene of the right great toe.  By report patient has had a endovascular procedure but there is no documentation on this.  Has had dry gangrenous changes to the right great toe which is now painful not relieved with Tylenol.  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  She is seen and evaluated with her daughter present.  Past Medical History:  Diagnosis Date  . BBB (bundle branch block) 03/18/12   left  . 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 GERD (gastroesophageal reflux disease)   . Pacemaker   . Refusal of blood transfusions as patient is Jehovah's Witness    Past Surgical History:  Procedure Laterality Date  . COLON RESECTION N/A 02/10/2013   Procedure: EXPLORATORY LAPAROTOMY, CLOSURE OF ABDOMEN;  Surgeon: [name], MD;  Location: [facility] OR;  Service: General;  Laterality: N/A;  . HERNIA REPAIR    . LAPAROTOMY N/A 02/09/2013   Procedure: Exploratory Laparotomy; Repair of umbilical hernia; repair of obturator hernia; Placement of wound vac; placement of gastrostomy tube;  Surgeon: [name], MD;  Location: [facility] OR;  Service: General;  Laterality: N/A;  . NO PAST SURGERIES    . PACEMAKER INSERTION  03/18/12   initial placement  . PERMANENT PACEMAKER INSERTION N/A 03/18/2012   Procedure: PERMANENT PACEMAKER INSERTION;  Surgeon: Marinus Maw, MD;  Location: [facility] CATH LAB;  Service: Cardiovascular;  Laterality: N/A;   Social History   Socioeconomic History  . Marital status: Widowed    Spouse name: Not on file  . Number of children: Not on file  . Years of education: Not on file  . Highest education level: Not on file  Occupational History  . Not on file  Tobacco Use  . Smoking status: Never Smoker  . Smokeless tobacco: Never Used  Substance and Sexual  Activity  . Alcohol use: No  . Drug use: No  . Sexual activity: Never  Other Topics Concern  . Not on file  Social History Narrative  . Not on file   Social Determinants of Health   Financial Resource Strain:   . Difficulty of Paying Living Expenses:   Food Insecurity:   . Worried About Programme researcher, broadcasting/film/video in the Last Year:   . Barista in the Last Year:   Transportation Needs:   . Freight forwarder (Medical):   Marland Kitchen Lack of Transportation (Non-Medical):   Physical Activity:   . Days of Exercise per Week:   . Minutes of Exercise per Session:   Stress:   . Feeling of Stress :   Social Connections:   . Frequency of Communication with Friends and Family:   . Frequency of Social Gatherings with Friends and Family:   . Attends Religious Services:   . Active Member of Clubs or Organizations:   . Attends Banker Meetings:   Marland Kitchen Marital Status:    Family History  Problem Relation Age of Onset  . Stroke Father   . Cancer Sister        breast   - negative except otherwise stated in the family history section Allergies  Allergen Reactions  . Lipitor [Atorvastatin] Other (See Comments)    Made the patient's throat burn   Prior to Admission medications   Medication  Sig Start Date End Date Taking? Authorizing Provider  acetaminophen (TYLENOL) 325 MG tablet Take 650 mg by mouth every 6 (six) hours as needed for mild pain.   Yes [provider]  ascorbic acid (VITAMIN C) 500 MG tablet Take 500 mg by mouth daily.   Yes [provider]  Ensure Plus (ENSURE PLUS) LIQD Take 237 mLs by mouth 2 (two) times daily between meals.   Yes [provider]  ferrous sulfate 325 (65 FE) MG tablet Take 325 mg by mouth daily with breakfast.  09/08/18  Yes [provider]  atorvastatin (LIPITOR) 40 MG tablet Take 40 mg by mouth. Patient not taking: Reported on 03/14/2020 03/08/20   [provider]  feeding supplement (ENSURE COMPLETE) LIQD  Take 237 mLs by mouth 3 (three) times daily with meals. Patient not taking: Reported on 03/14/2020 03/01/13   Nonie Hoyer, PA-C   DG Foot Complete Right  Result Date: 03/14/2020 CLINICAL DATA:  Erythema and swelling right lower extremity for several weeks, black right great toe and second toe EXAM: RIGHT FOOT COMPLETE - 3+ VIEW COMPARISON:  None. FINDINGS: Frontal, oblique, lateral views of the right foot are obtained. The bones are severely osteopenic. There are no acute or destructive bony lesions. Extensive vascular calcifications are noted. Thinning of the soft tissues overlying the first and second distal phalanges compatible with given history of soft tissue necrosis. There is lucency at the distal tuft of the first digit which may reflect subcutaneous gas. IMPRESSION: 1. Soft tissue thinning at the distal margin of the first and second digits, compatible with given history of discoloration and soft tissue necrosis. Possible subcutaneous gas at the distal tuft of the first digit. 2. Diffuse osteopenia.  No acute or destructive bony lesions. Electronically Signed   By: Sharlet Salina [name]   On: 03/14/2020 17:03   VAS [place] ABI WITH/WO TBI  Result Date: 03/15/2020 LOWER EXTREMITY DOPPLER STUDY Indications: Gangrene, and Right great toe ischemia.  Comparison Study: no prior Performing Technologist: Blanch Media RVS  Examination Guidelines: A complete evaluation includes at minimum, Doppler waveform signals and systolic blood pressure reading at the level of bilateral brachial, anterior tibial, and posterior tibial arteries, when vessel segments are accessible. Bilateral testing is considered an integral part of a complete examination. Photoelectric Plethysmograph (PPG) waveforms and toe systolic pressure readings are included as required and additional duplex testing as needed. Limited examinations for reoccurring indications may be performed as noted.  ABI Findings:  +--------+------------------+-----+----------+--------+ Right   Rt Pressure (mmHg)IndexWaveform  Comment  +--------+------------------+-----+----------+--------+ [id number]                    triphasic          +--------+------------------+-----+----------+--------+ PTA     255               1.83 monophasic         +--------+------------------+-----+----------+--------+ DP      103               0.74 monophasic         +--------+------------------+-----+----------+--------+ +--------+------------------+-----+----------+-----------+ Left    Lt Pressure (mmHg)IndexWaveform  Comment     +--------+------------------+-----+----------+-----------+ [id number]                    triphasic             +--------+------------------+-----+----------+-----------+ PTA  not audible +--------+------------------+-----+----------+-----------+ DP      253               1.82 monophasic            +--------+------------------+-----+----------+-----------+ +-------+-----------+-----------+------------+------------+ ABI/TBIToday's ABIToday's TBIPrevious ABIPrevious TBI +-------+-----------+-----------+------------+------------+ Right  0.74                                           +-------+-----------+-----------+------------+------------+ Left   1.82                                           +-------+-----------+-----------+------------+------------+  Summary: Right: Resting right ankle-brachial index indicates noncompressible right lower extremity arteries. Left: Resting left ankle-brachial index indicates noncompressible left lower extremity arteries.  *See table(s) above for measurements and observations.    Preliminary    - pertinent xrays, CT, MRI studies were reviewed and independently interpreted  Positive ROS: All other systems have been reviewed and were otherwise negative with the exception of those mentioned in the HPI  and as above.  Physical Exam: General: Alert, no acute distress Psychiatric: Patient is competent for consent with normal mood and affect Lymphatic: No axillary or cervical lymphadenopathy Cardiovascular: No pedal edema Respiratory: No cyanosis, no use of accessory musculature GI: No organomegaly, abdomen is soft and non-tender    Images:  @ENCIMAGES @  Labs:  Lab Results  Component Value Date   REPTSTATUS 02/16/2013 FINAL 02/15/2013   CULT NO GROWTH 02/15/2013    Lab Results  Component Value Date   ALBUMIN 1.9 (L) 03/14/2020   ALBUMIN 1.7 (L) 02/24/2013   ALBUMIN 1.6 (L) 02/22/2013   PREALBUMIN 5.0 (L) 02/15/2013    Neurologic: Patient does not have protective sensation bilateral lower extremities.   MUSCULOSKELETAL:   Skin: Examination patient has dry gangrenous changes of the right great toe with ischemic gangrenous changes to the first webspace and second toe.  She does not have a palpable pulse.  Her leg is thin and atrophic.  Her calf is warm to the touch.  Patient uses her legs for transfers only she does not ambulate.  Assessment: Assessment: Jehovah's Witness with a hemoglobin of 6.8 and albumin of 1.9 with dry gangrenous changes of the right great toe without infection with ischemic pain not relieved with Tylenol.  Plan: Plan: Discussed with the patient and her daughter the least invasive intervention would be to proceed with a below the knee and most likely an above-the-knee amputations.  With patient's hemoglobin of 6.8 and being a Jehovah's Witness  patient is not a good surgical candidate.  Have recommended increasing her nutrition status with protein and iron.  Discussed that if patient is is able to increase her hemoglobin and albumin amputation surgery would be her least invasive option to relieve the pain in the right foot.  I will follow-up as needed.  Thank you for the consult and the opportunity to see Betty [name]  02/17/2013, MD [facility]  Orthopedics [phone] 12:29 PM

## 2020-03-15 NOTE — ED Notes (Signed)
Lunch tray ordered 

## 2020-03-15 NOTE — ED Notes (Signed)
Dois Davenport from dr early office at bedside with patient

## 2020-03-15 NOTE — ED Notes (Signed)
Ordered a hospital bed--Isaura Schiller  

## 2020-03-15 NOTE — Progress Notes (Signed)
Pharmacy Antibiotic Note  Betty [name] is a 86 y.o. female admitted on 03/14/2020 with Nec Fasciitis .  Pharmacy has been consulted for Zosyn and Vancomycin  dosing.   Height: 5\' 1"  (154.9 cm) Weight: 38.6 kg (85 lb) IBW/kg (Calculated) : 47.8  Temp (24hrs), Avg:97.4 F (36.3 C), Min:97.4 F (36.3 C), Max:97.4 F (36.3 C)  Recent Labs  Lab 03/14/20 1547 03/15/20 0455  WBC 9.1 9.7  CREATININE 1.14* 1.22*  LATICACIDVEN 1.5  --     Estimated Creatinine Clearance: 13.8 mL/min (A) (by C-G formula based on SCr of 1.22 mg/dL (H)).    Allergies  Allergen Reactions  . Lipitor [Atorvastatin] Other (See Comments)    Made the patient's throat burn    Antimicrobials this admission: 7/14 Zosyn >>  7/14 Vancomycin >>   Dose adjustments this admission: N/a  Microbiology results: Pending   Plan:  - Zosyn 3.375g IV x 1 dose over 30 min followed by Zosyn 3.375g IV every 12 hours infused over 4 hours  - Vancomycin 1000mg  IV x 1 dose  - Will order Vancomycin trough for ~ 48 hours after last dose  - Likely will require vancomycin 500mg  Q48h - Monitor patients renal function and urine output  - De-escalate ABX when appropriate   Thank you for allowing pharmacy to be a part of this patient's care.  8/14 PharmD. BCPS 03/15/2020 4:24 PM

## 2020-03-15 NOTE — Progress Notes (Signed)
ABI has been completed.   Preliminary results in CV Proc.   Blanch Media 03/15/2020 9:29 AM

## 2020-03-15 NOTE — ED Notes (Signed)
Pt placed on hospital bed

## 2020-03-15 NOTE — ED Notes (Signed)
Attempted report x1. 

## 2020-03-16 ENCOUNTER — Other Ambulatory Visit: Payer: Self-pay

## 2020-03-16 DIAGNOSIS — D649 Anemia, unspecified: Secondary | ICD-10-CM | POA: Diagnosis present

## 2020-03-16 DIAGNOSIS — N179 Acute kidney failure, unspecified: Secondary | ICD-10-CM | POA: Diagnosis present

## 2020-03-16 DIAGNOSIS — Z66 Do not resuscitate: Secondary | ICD-10-CM | POA: Diagnosis not present

## 2020-03-16 DIAGNOSIS — I96 Gangrene, not elsewhere classified: Secondary | ICD-10-CM | POA: Diagnosis not present

## 2020-03-16 DIAGNOSIS — I998 Other disorder of circulatory system: Secondary | ICD-10-CM | POA: Diagnosis not present

## 2020-03-16 DIAGNOSIS — Z515 Encounter for palliative care: Secondary | ICD-10-CM

## 2020-03-16 DIAGNOSIS — L8990 Pressure ulcer of unspecified site, unspecified stage: Secondary | ICD-10-CM | POA: Insufficient documentation

## 2020-03-16 LAB — CBC
HCT: 22.3 % — ABNORMAL LOW (ref 36.0–46.0)
Hemoglobin: 6.6 g/dL — CL (ref 12.0–15.0)
MCH: 28.8 pg (ref 26.0–34.0)
MCHC: 29.6 g/dL — ABNORMAL LOW (ref 30.0–36.0)
MCV: 97.4 fL (ref 80.0–100.0)
Platelets: 280 10*3/uL (ref 150–400)
RBC: 2.29 MIL/uL — ABNORMAL LOW (ref 3.87–5.11)
RDW: 24.1 % — ABNORMAL HIGH (ref 11.5–15.5)
WBC: 10.1 10*3/uL (ref 4.0–10.5)
nRBC: 0.5 % — ABNORMAL HIGH (ref 0.0–0.2)

## 2020-03-16 LAB — BASIC METABOLIC PANEL
Anion gap: 8 (ref 5–15)
BUN: 16 mg/dL (ref 8–23)
CO2: 23 mmol/L (ref 22–32)
Calcium: 7.7 mg/dL — ABNORMAL LOW (ref 8.9–10.3)
Chloride: 114 mmol/L — ABNORMAL HIGH (ref 98–111)
Creatinine, Ser: 1.3 mg/dL — ABNORMAL HIGH (ref 0.44–1.00)
GFR calc Af Amer: 38 mL/min — ABNORMAL LOW (ref >60)
GFR calc non Af Amer: 33 mL/min — ABNORMAL LOW (ref >60)
Glucose, Bld: 117 mg/dL — ABNORMAL HIGH (ref 70–99)
Potassium: 3.2 mmol/L — ABNORMAL LOW (ref 3.5–5.1)
Sodium: 145 mmol/L (ref 135–145)

## 2020-03-16 MED ORDER — OXYCODONE HCL 5 MG PO TABS
2.5000 mg | ORAL_TABLET | ORAL | Status: DC | PRN
  Administered 2020-03-17: 5 mg via ORAL
  Administered 2020-03-17: 2.5 mg via ORAL
  Administered 2020-03-18: 5 mg via ORAL
  Filled 2020-03-16 (×3): qty 1

## 2020-03-16 MED ORDER — ELDERTONIC PO LIQD
15.0000 mL | Freq: Every day | ORAL | Status: DC
  Administered 2020-03-16 – 2020-03-20 (×5): 15 mL via ORAL
  Filled 2020-03-16 (×5): qty 15

## 2020-03-16 MED ORDER — PIPERACILLIN-TAZOBACTAM IN DEX 2-0.25 GM/50ML IV SOLN
2.2500 g | Freq: Three times a day (TID) | INTRAVENOUS | Status: DC
  Administered 2020-03-16 – 2020-03-20 (×12): 2.25 g via INTRAVENOUS
  Filled 2020-03-16 (×13): qty 50

## 2020-03-16 MED ORDER — LIDOCAINE 5 % EX PTCH
1.0000 | MEDICATED_PATCH | CUTANEOUS | Status: DC
  Administered 2020-03-16 – 2020-03-19 (×4): 1 via TRANSDERMAL
  Filled 2020-03-16 (×4): qty 1

## 2020-03-16 MED ORDER — ENSURE ENLIVE PO LIQD
237.0000 mL | Freq: Three times a day (TID) | ORAL | Status: DC
  Administered 2020-03-16 – 2020-03-20 (×10): 237 mL via ORAL

## 2020-03-16 MED ORDER — POTASSIUM CHLORIDE 2 MEQ/ML IV SOLN
INTRAVENOUS | Status: DC
  Filled 2020-03-16 (×5): qty 1000

## 2020-03-16 NOTE — Progress Notes (Signed)
Patient ID: Betty [name], female   DOB: 1915-04-14, 86 y.o.   MRN: [id number] Comfortable in her bed.  Denies any pain.  Physical exam with progressive dry gangrenous changes in the toes of her right foot  We have been unsuccessful in obtaining her arteriogram reports from outlying hospital.  Doubt that this would make any difference in her care.  Again discussed with the patient and her daughter present.  Would not be appropriate for aggressive bypass for attempted partial foot salvage.  She clearly has a gangrenous changes of the foot.  Agree with Dr. Lajoyce Corners that above-knee amputation would be warranted for progressive gangrenous changes or uncontrolled pain.  Will sign off.  Please call if we can assist

## 2020-03-16 NOTE — Progress Notes (Signed)
   Subjective: Patient was sleeping when we entered. She reports her toe was hurting last night worse than ever. Received pain medicine and that she no longer has pain.  Daughter has HCPOA form, which stated that if patient's condition is hopeless that she would not want resuscitation. After extensive discussion with patient and daughter, ultimately decided to change status to DNR/DNI. Notified nurse, Marcelino Duster, to copy and scan form.   Objective:  Vital signs in last 24 hours: Vitals:   03/15/20 2009 03/15/20 2349 03/16/20 0452 03/16/20 0751  BP: (!) 142/55 138/60 (!) 129/58 (!) 117/58  Pulse: 72 76 70 77  Resp: 14 14 14 14   Temp: 98.3 F (36.8 C) 98.1 F (36.7 C) 97.8 F (36.6 C) 98.4 F (36.9 C)  TempSrc: Oral Oral Oral Oral  SpO2: 97% 100% 97% 98%  Weight:      Height:       Physical Exam: Constitutional: Pleasant elderly female, NAD, frail-appearing, lying comfortably in bed. HENT: Mouth is dry. Eyes: Pale conjunctiva. CV: Normal rate and regular rhythm. Absent dorsalis pedis and posterior tibialis pulses in RLE. Faint DP and PT pulses in the LLE. 2+ radial pulses in b/l UE. Pulmonary: Clear to auscultation bilaterally. No adventitious sounds noted. Abdomen: Soft, non-distended, non-tender. Normoactive bowels sounds in all quadrants. No guarding or rebound. MSK: No tenderness to palpation at area of necrosis or in the rest of RLE. Skin: Right great toe is gangrenous, hardened to the touch. Neuro: No focal deficit present. Sensation is absent in first and second digits of RLE. Sensation intact in the rest of RLE.  Assessment/Plan:  Active Problems:   Critical lower limb ischemia  This is a 86 year old female, Jehovah's Witness, with a history of mobitz type 2 s/p pacemaker who presented with right great toe painnoted to have significant anemia and critical limb ischemia.   Gangrene of right first toe with critical limb ischemia: Recent balloon angioplasty. Concern at  outpatient hospital about placing a stent since patient is a Jehovah's witnesswith severe anemia.Absentpulses in right foot with necrosis of the 1st and second toe.  -As per orthopedics and vascular, patient would be high risk for surgery given her anemia. -ABI shows non-compressible vessels in left and right lower extremities. -Continue vanc and zosynas empiric coverage -Tylenol PRN, oxycodone 5mg  for severe and breakthrough pain. -Palliative consulted for goals of care and symptomatic management as patient may not be able to undergo amputation if Hgb remains low. -Patient's status changed to DNR/DNI  Anemia: Hgb 6.8 at admission. VSS today. Patient is a 100 witness and reported that she does not want blood products.As per her daughter, her Hgb tends to stay low. -given one dose of IV feraheme on 03/14/20 -f/u MMA labs -Vitamin B12 - 1.926 -Hgb stable at 6.6  Malnutrition: -Nutrition consulted  Hypokalemia: -Mild hypokalemia (3.2) did not respond to TEFL teacher potassium PO on 07/15 -Will give D5W with KCl today  Mobitz type 2 s/p pacemaker: HR83. -EKG unchanged from priors   Prior to Admission Living Arrangement: Home Anticipated Discharge Location: TBD Barriers to Discharge:  Dispo: pending medical workup   8/15, MD 03/16/2020, 8:41 AM Pager: [phone] After 5pm on weekdays and 1pm on weekends: On Call pager [phone]

## 2020-03-16 NOTE — Progress Notes (Signed)
Initial Nutrition Assessment  DOCUMENTATION CODES:   Underweight, Severe malnutrition in context of chronic illness  INTERVENTION:   - Ensure Enlive po TID, each supplement provides 350 kcal and 20 grams of protein  - Continue geriatric MVI  - Recommend SLP consult  NUTRITION DIAGNOSIS:   Severe Malnutrition related to chronic illness (PVD) as evidenced by severe fat depletion, severe muscle depletion.  GOAL:   Patient will meet greater than or equal to 90% of their needs  MONITOR:   PO intake, Supplement acceptance, Labs, Weight trends, Skin  REASON FOR ASSESSMENT:   Consult Assessment of nutrition requirement/status, Wound healing  ASSESSMENT:   86 year old female who presented with right toe pain. PMH of Mobitz type II heart block s/p pacemaker, anemia, PVD, GERD. Pt admitted with dry gangrene of right great toe with critical limb ischemia.   Palliative has been consulted for GOC and symptom management as pt may be unable to undergo amputation as recommended by Orthopedics if hemoglobin remains low as pt is a Jehovah's Witness.  Spoke with pt's daughter at bedside. Pt awoke briefly but then fell back to sleep.  Pt's daughter reports that pt typically has a good appetite. Noted breakfast meal tray at bedside with a few bites of grits and a few bites of eggs consumed. Pt's daughter reports that this is typical for the patient and states that pt "did well" with her breakfast tray.  Pt's daughter reports that pt typically consumes 2 main meals and then snacks between the meals. Pt's daughter mentions that they receive food from a food pantry.  Breakfast: vanilla Ensure, cream of wheat or grits with sausage or bacon Snacks: Cheetos, jello, pecan sandies, applesauce Dinner: [employer] cheddar broccoli soup or noodles and chicken casserole or salmon patties and rice or miracle whip sandwich with chips Evening snack: vanilla Ensure  Pt's daughter reports that pt briefly had a  G-tube placed in 2014 "after her surgery." Per chart review, pt had an ex-la- with repair of umbilical hernia, repair of obturator hernia, and placement of G-tube in June 2014.  Pt's daughter mentions that pt's weight has been stable recently. She reports that she has been told pt has "the appearance of malnutrition" but that this is caused by her swallowing issues. When asked for clarification, pt's daughter states that pt always gets congested when she eats and has to keep a cup nearby to spit into. Pt's daughter reports pt had a "swallow test" previously but isn't sure what the outcome was. RD communicated with MD and recommended SLP consult.  Reviewed weight history in chart. Weight of 85 lbs recorded on admission appears stated rather than measured. If accurate, pt with some weight gain over the last 4 months.  RD to order Ensure Enlive during admission. Will follow for further discussions regarding GOC.  Medications reviewed and include: geriatric MVI, IV abx IVF: D5 with KCl @ 125 ml/hr  Labs reviewed: potassium 3.2, hemoglobin 6.6  NUTRITION - FOCUSED PHYSICAL EXAM:    Most Recent Value  Orbital Region Severe depletion  Upper Arm Region Mild depletion  Thoracic and Lumbar Region Severe depletion  Buccal Region Severe depletion  Temple Region Severe depletion  Clavicle Bone Region Severe depletion  Clavicle and Acromion Bone Region Severe depletion  Scapular Bone Region Severe depletion  Dorsal Hand Moderate depletion  Patellar Region Severe depletion  Anterior Thigh Region Severe depletion  Posterior Calf Region Severe depletion  Edema (RD Assessment) None  Hair Reviewed  Eyes Reviewed  Mouth  Reviewed  Skin Reviewed  Nails Reviewed       Diet Order:   Diet Order            Diet regular Room service appropriate? Yes with Assist; Fluid consistency: Thin  Diet effective now                 EDUCATION NEEDS:   Education needs have been addressed  Skin:  Skin  Assessment: Skin Integrity Issues: Stage II: coccyx  Last BM:  no documented BM  Height:   Ht Readings from Last 1 Encounters:  03/14/20 5\' 1"  (1.549 m)    Weight:   Wt Readings from Last 1 Encounters:  03/14/20 38.6 kg    Ideal Body Weight:  47.7 kg  BMI:  Body mass index is 16.06 kg/m.  Estimated Nutritional Needs:   Kcal:  1200-1400  Protein:  50-75 grams  Fluid:  1.2-1.4 L    03/16/20, MS, RD, LDN Inpatient Clinical Dietitian Please see [facility] for contact information.

## 2020-03-16 NOTE — Progress Notes (Signed)
Consultation Note Date: 03/16/2020   Patient Name: Betty [name]  DOB: [birth year]  MRN: [id number]  Age / Sex: 86 y.o., female  PCP: Dorna Mai, MD Referring Physician: Lucious Groves, DO  Reason for Consultation: Establishing goals of care  HPI/Patient Profile: 86 y.o. female  with past medical history of complete AV block s/p pace maker placement who is also a Jehovah's Witness and will not accept blood transfusions, was admitted on 03/14/2020 with an ischemic great right toe.  Her albumin is 1.9.  The patient was not a candidate for surgery and her family was not interested in pursuing surgery.  She is currently being treated with IV antibiotics.   Clinical Assessment and Goals of Care:  I have reviewed medical records including EPIC notes, labs and imaging, received report from Dr. Allyson Sabal , examined the patient and met at bedside with her daughter Horris Latino  to discuss diagnosis prognosis, [place], EOL wishes, disposition and options.  I introduced Palliative Medicine as specialized medical care for people living with serious illness. It focuses on providing relief from the symptoms and stress of a serious illness.   We discussed a brief life review of the patient. Horris Latino and her mother has lived together for many years.  She is her mother's primary care taker.  [name]'s son who lives next door assists her.  The patient's other daughter Donnald Garre handles her finances and shopping.    As far as functional and nutritional status prior to admission Ms. Noboa was able to get out of bed and use the pot and then return to bed.  Horris Latino helped with ADLs.  Just a few weeks ago the patient stopped being able to bear weight on her toe and has been bed bound since.  We discussed her current illness and what it means in the larger context of her on-going co-morbidities.  Natural disease trajectory and expectations at EOL were  discussed.  Horris Latino granted me permission to express my concerns so I explained that they antibiotics we are giving will delay the infection but it likely will not cure it.  The infection will continue to return and will eventually likely be a significant factor in her death.  The difference between aggressive medical intervention and comfort care was considered in light of the patient's goals of care.  Family has multiple relatives that have lived to 52, 11, etc...  Family wants to do everything they can to support Ms. Husser with in reason.  They understand that some times doing less is actually better for her.  Horris Latino speaks about the fact that her mother was on no medications and has done very well - some times doing fewer medical interventions is the best way to help a person live their best possible life.  Hospice and Palliative Care services outpatient were explained and offered.  Horris Latino and discussed the pros and cons of both Palliative and Hospice.  She has decided that when her mother first goes home she would like for her to have the normal  home health nursing and aid follow up.  This will include Palliative Outpatient.   Horris Latino states that when those nursing services run out she will request hospice from the Palliative care outpatient NP.  Questions and concerns were addressed.  The family was encouraged to call with questions or concerns.        Primary Decision Maker:  NEXT OF KIN daughter Horris Latino    SUMMARY OF RECOMMENDATIONS    Home with home health and outpatient Palliative to follow.    Horris Latino will want to engage Hospice at some point after the home health RN benefit runs out.  Code Status/Advance Care Planning:  DNR   Symptom Management:   Will add lidocaine patch to right foot to see if that may reduce pain and reduce need for opioid medication  Will put a range on the oxycondone of 2.5 - 5.0 mg PRN to allow the lowest dose possible to be used.  Additional  Recommendations (Limitations, Scope, Preferences):  Avoid Hospitalization  Palliative Prophylaxis:   Frequent Pain Assessment  Psycho-social/Spiritual:   Desire for further Chaplaincy support: Politely declined as they have their own minister   Prognosis: less than 6 months would not be surprising given advanced age and the high likelihood that this infection will recur without amputation.  Discharge Planning: Home with Home Health and Palliative Care outpatient services      Primary Diagnoses: Present on Admission: . Critical lower limb ischemia   I have reviewed the medical record, interviewed the patient and family, and examined the patient. The following aspects are pertinent.  Past Medical History:  Diagnosis Date  . BBB (bundle branch block) 03/18/12   left  . Complete AV block ([place]) 03/18/12  . GERD (gastroesophageal reflux disease)   . Pacemaker   . Refusal of blood transfusions as patient is Jehovah's Witness    Social History   Socioeconomic History  . Marital status: Widowed    Spouse name: Not on file  . Number of children: Not on file  . Years of education: Not on file  . Highest education level: Not on file  Occupational History  . Not on file  Tobacco Use  . Smoking status: Never Smoker  . Smokeless tobacco: Never Used  Substance and Sexual Activity  . Alcohol use: No  . Drug use: No  . Sexual activity: Never  Other Topics Concern  . Not on file  Social History Narrative  . Not on file   Social Determinants of Health   Financial Resource Strain:   . Difficulty of Paying Living Expenses:   Food Insecurity:   . Worried About Charity fundraiser in the Last Year:   . Arboriculturist in the Last Year:   Transportation Needs:   . Film/video editor (Medical):   Marland Kitchen Lack of Transportation (Non-Medical):   Physical Activity:   . Days of Exercise per Week:   . Minutes of Exercise per Session:   Stress:   . Feeling of Stress :   Social  Connections:   . Frequency of Communication with Friends and Family:   . Frequency of Social Gatherings with Friends and Family:   . Attends Religious Services:   . Active Member of Clubs or Organizations:   . Attends Archivist Meetings:   Marland Kitchen Marital Status:    Family History  Problem Relation Age of Onset  . Stroke Father   . Cancer Sister        breast  Allergies  Allergen Reactions  . Lipitor [Atorvastatin] Other (See Comments)    Made the patient's throat burn     Vital Signs: BP (!) 117/58 (BP Location: Left Arm)   Pulse 77   Temp 98.4 F (36.9 C) (Oral)   Resp 14   Ht 5' 1" (1.549 m)   Wt 38.6 kg   SpO2 98%   BMI 16.06 kg/m  Pain Scale: 0-10 POSS *See Group Information*: S-Acceptable,Sleep, easy to arouse Pain Score: Asleep   SpO2: SpO2: 98 % O2 Device:SpO2: 98 % O2 Flow Rate: .     Palliative Assessment/Data:  20%     Time In: 12:00  Time Out: 1:00 Time Total: 60 min. Visit consisted of counseling and education dealing with the complex and emotionally intense issues surrounding the need for palliative care and symptom management in the setting of serious and potentially life-threatening illness. Greater than 50%  of this time was spent counseling and coordinating care related to the above assessment and plan.  Signed by: Florentina Jenny, PA-C Palliative Medicine  Please contact Palliative Medicine Team phone at [phone] for questions and concerns.  For individual provider: See Shea Evans

## 2020-03-16 NOTE — Progress Notes (Addendum)
Pharmacy Antibiotic Note  Betty [name] is a 86 y.o. female admitted on 03/14/2020 with gangrene of R great toe and critical limb ischemia.   Pharmacy has been consulted for Vancomycin and Zosyn dosing.    Day #3 antibiotics.  Vancomycin 1 gm IV x 1 given on 7/14 at 1950.   Currently not on a standing Vancomycin regimen, but expect dosing interval to be at least q48hrs.  Creatinine has trended up some.   Zosyn begun with extended-infusion, but will change to 30 min infusions for crcl < 20 ml/ml.   Plan:  Random Vancomycin level tonight, 48 hrs after initial dose per prior plan.  Re-dose Vanc when level ~15 mcg/ml  Adjust Zosyn to 2.25 gm IV q8h  Follow renal function, culture data, clinical progress and antibiotic plans.  Height: 5\' 1"  (154.9 cm) Weight: 38.6 kg (85 lb) IBW/kg (Calculated) : 47.8  Temp (24hrs), Avg:98.2 F (36.8 C), Min:97.8 F (36.6 C), Max:98.4 F (36.9 C)  Recent Labs  Lab 03/14/20 1547 03/15/20 0455 03/16/20 0730  WBC 9.1 9.7 10.1  CREATININE 1.14* 1.22* 1.30*  LATICACIDVEN 1.5  --   --     Estimated Creatinine Clearance: 13 mL/min (A) (by C-G formula based on SCr of 1.3 mg/dL (H)).    Allergies  Allergen Reactions  . Lipitor [Atorvastatin] Other (See Comments)    Made the patient's throat burn    Antimicrobials this admission:  Vancomycin 7/14 >>  Zosyn 7/14 >>  Dose adjustments this admission:  7/16: random Vanc level tonight (~48 hrs after initial dose)  Microbiology results:  7/14 blood x 2: no growth < 24 hrs to date  7/14 COVID: negative Thank you for allowing pharmacy to be a part of this patient's care.  8/14, Dennie Fetters Phone: [place] 03/16/2020 11:05 AM

## 2020-03-17 LAB — BASIC METABOLIC PANEL
Anion gap: 9 (ref 5–15)
BUN: 13 mg/dL (ref 8–23)
CO2: 21 mmol/L — ABNORMAL LOW (ref 22–32)
Calcium: 7.6 mg/dL — ABNORMAL LOW (ref 8.9–10.3)
Chloride: 108 mmol/L (ref 98–111)
Creatinine, Ser: 1.28 mg/dL — ABNORMAL HIGH (ref 0.44–1.00)
GFR calc Af Amer: 39 mL/min — ABNORMAL LOW (ref >60)
GFR calc non Af Amer: 34 mL/min — ABNORMAL LOW (ref >60)
Glucose, Bld: 113 mg/dL — ABNORMAL HIGH (ref 70–99)
Potassium: 5.1 mmol/L (ref 3.5–5.1)
Sodium: 138 mmol/L (ref 135–145)

## 2020-03-17 LAB — CBC
HCT: 23.7 % — ABNORMAL LOW (ref 36.0–46.0)
Hemoglobin: 7 g/dL — ABNORMAL LOW (ref 12.0–15.0)
MCH: 29 pg (ref 26.0–34.0)
MCHC: 29.5 g/dL — ABNORMAL LOW (ref 30.0–36.0)
MCV: 98.3 fL (ref 80.0–100.0)
Platelets: 248 10*3/uL (ref 150–400)
RBC: 2.41 MIL/uL — ABNORMAL LOW (ref 3.87–5.11)
RDW: 23.6 % — ABNORMAL HIGH (ref 11.5–15.5)
WBC: 12.1 10*3/uL — ABNORMAL HIGH (ref 4.0–10.5)
nRBC: 1.2 % — ABNORMAL HIGH (ref 0.0–0.2)

## 2020-03-17 LAB — VANCOMYCIN, RANDOM: Vancomycin Rm: 6

## 2020-03-17 LAB — GLUCOSE, CAPILLARY: Glucose-Capillary: 122 mg/dL — ABNORMAL HIGH (ref 70–99)

## 2020-03-17 MED ORDER — VANCOMYCIN HCL IN DEXTROSE 1-5 GM/200ML-% IV SOLN
1000.0000 mg | Freq: Once | INTRAVENOUS | Status: AC
  Administered 2020-03-17: 1000 mg via INTRAVENOUS
  Filled 2020-03-17: qty 200

## 2020-03-17 NOTE — Evaluation (Signed)
Clinical/Bedside Swallow Evaluation Patient Details  Name: QUINTESSA SIMMERMAN MRN: [id number] Date of Birth: [birth year]  Today's Date: 03/17/2020 Time: SLP Start Time (ACUTE ONLY): 0737 SLP Stop Time (ACUTE ONLY): 0805 SLP Time Calculation (min) (ACUTE ONLY): 28 min  Past Medical History:  Past Medical History:  Diagnosis Date  . BBB (bundle branch block) 03/18/12   left  . 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 GERD (gastroesophageal reflux disease)   . Pacemaker   . Refusal of blood transfusions as patient is Jehovah's Witness    Past Surgical History:  Past Surgical History:  Procedure Laterality Date  . COLON RESECTION N/A 02/10/2013   Procedure: EXPLORATORY LAPAROTOMY, CLOSURE OF ABDOMEN;  Surgeon: [name], MD;  Location: [facility] OR;  Service: General;  Laterality: N/A;  . HERNIA REPAIR    . LAPAROTOMY N/A 02/09/2013   Procedure: Exploratory Laparotomy; Repair of umbilical hernia; repair of obturator hernia; Placement of wound vac; placement of gastrostomy tube;  Surgeon: [name], MD;  Location: [facility] OR;  Service: General;  Laterality: N/A;  . NO PAST SURGERIES    . PACEMAKER INSERTION  03/18/12   initial placement  . PERMANENT PACEMAKER INSERTION N/A 03/18/2012   Procedure: PERMANENT PACEMAKER INSERTION;  Surgeon: Marinus Maw, MD;  Location: [facility] CATH LAB;  Service: Cardiovascular;  Laterality: N/A;   HPI:  Pt is a 86 yo female admitted with dry gangrene of R great toe wtih critical limb ischemia. Given malnutrition and anemia, she is not a candidate for surgical intervention. Swallow evaluation was recommended given family report of h/o dysphagia. SLP evaluation in [facility], but other notes indicate a significant esophageal stricture, unable to be dilated. PMH also includes: GERD, complete AV block s/p pace maker placement   Assessment / Plan / Recommendation Clinical Impression  Pt exhibits multiple subswallows per bolus - at times taking upwards of 7-8 swallows per  individual sip. She had an episode of coughing x1 when she overfilled her mouth with food and liquid, expectorating what was in her oral cavity and coughing briefly afterwards with transient wet vocal quality. Otherwise, when she did not mix consistencies and when she took smaller boluses, there were no overt s/s of aspiration despite signs of dysphagia. Per chart review, pt had a significant esophageal stricture that was not able to be stretched - unsure from chart if she was ever able to have this done, but pt does not recall ever having this procedure. If so, this could be contributing to her symptoms and is likely more chronic in nature. Pt politely declines any additional work up or follow up from SLP at this time. Given that she also denies a h/o PNA and that she uses precautions like small bites/sips with Mod I, spitting out food at home as well when she feels like she has gotten too much in her mouth, it is also reasonable to continue current diet. SLP to sign off at this time - please reorder if needed.  SLP Visit Diagnosis: Dysphagia, unspecified (R13.10)    Aspiration Risk  Mild aspiration risk;Moderate aspiration risk    Diet Recommendation Regular;Thin liquid   Liquid Administration via: Cup;Straw Medication Administration: Crushed with puree Supervision: Patient able to self feed;Intermittent supervision to cue for compensatory strategies Compensations: Slow rate;Small sips/bites Postural Changes: Seated upright at 90 degrees;Remain upright for at least 30 minutes after po intake    Other  Recommendations Oral Care Recommendations: Oral care QID   Follow up Recommendations None  Frequency and Duration            Prognosis Prognosis for Safe Diet Advancement: Fair Barriers to Reach Goals: Time post onset      Swallow Study   General HPI: Pt is a 86 yo female admitted with dry gangrene of R great toe wtih critical limb ischemia. Given malnutrition and anemia, she is  not a candidate for surgical intervention. Swallow evaluation was recommended given family report of h/o dysphagia. SLP evaluation in 2014 [facility], but other notes indicate a significant esophageal stricture, unable to be dilated. PMH also includes: GERD, complete AV block s/p pace maker placement Type of Study: Bedside Swallow Evaluation Previous Swallow Assessment: see HPI Diet Prior to this Study: Regular;Thin liquids Temperature Spikes Noted: No Respiratory Status: Room air History of Recent Intubation: No Behavior/Cognition: Alert;Cooperative;Pleasant mood Oral Cavity Assessment: Other (comment) (smooth bump on tip of tongue; present for ~2 years per pt) Oral Care Completed by SLP: No Oral Cavity - Dentition: Dentures, top;Dentures, bottom Vision: Functional for self-feeding Self-Feeding Abilities: Able to feed self Patient Positioning: Upright in bed Baseline Vocal Quality: Low vocal intensity Volitional Cough: Strong Volitional Swallow: Able to elicit    Oral/Motor/Sensory Function Overall Oral Motor/Sensory Function: Generalized oral weakness   Ice Chips Ice chips: Not tested   Thin Liquid Thin Liquid: Impaired Presentation: Cup;Self Fed;Straw Pharyngeal  Phase Impairments: Cough - Immediate;Multiple swallows    Nectar Thick Nectar Thick Liquid: Not tested   Honey Thick Honey Thick Liquid: Not tested   Puree Puree: Impaired Presentation: Self Fed;Spoon Pharyngeal Phase Impairments: Multiple swallows   Solid     Solid: Impaired Presentation: Self Fed Oral Phase Functional Implications: Oral residue;Impaired mastication Pharyngeal Phase Impairments: Cough - Immediate;Multiple swallows      Mahala Menghini., M.A. CCC-SLP Acute Rehabilitation Services Pager [phone] Office [phone]  03/17/2020,8:12 AM

## 2020-03-17 NOTE — Progress Notes (Signed)
   Subjective: Reports doing fine, no pain except her big toe tender to touch reports pain controlled by medication. She's eating. Reports not needing anything at the moment. Has no questions. Discussed that we will discuss with her daughter.   Objective:  Vital signs in last 24 hours: Vitals:   03/16/20 0751 03/16/20 1635 03/16/20 2011 03/17/20 0349  BP: (!) 117/58 136/72 (!) 105/45 (!) 111/54  Pulse: 77 77 78 72  Resp: 14 18 18 15   Temp: 98.4 F (36.9 C) 98.3 F (36.8 C) 98.7 F (37.1 C) 98.3 F (36.8 C)  TempSrc: Oral Oral Oral Oral  SpO2: 98% 97% 100% 100%  Weight:      Height:       General: Elderly female, NAD, sitting up in bed Cardiac: RRR, no m/r/g. Absent dorsalis pedis pulses in RLE, decreased in LLE.  Pulmonary: CTABL, no wheezing, rhonchi, rales MSK: Right 1st digit necrotic, gangrenous, right 2nd toe darkened and tender to palpation   Assessment/Plan:  Principal Problem:   Critical lower limb ischemia Active Problems:   Toe necrosis (HCC)   Pressure injury of skin   Palliative care encounter   DNR (do not resuscitate)   AKI (acute kidney injury) (HCC)   Normocytic anemia  This is a 86 year old female, Jehovah's Witness, with a history of mobitz type 2 s/p pacemaker who presented with right great toe painnoted to have significant anemia and critical limb ischemia.   Gangrene of right first toe with critical limb ischemia: Recent balloon angioplasty. Concern at outpatient hospital about placing a stent since patient is a Jehovah's witnesswith severe anemia.Absentpulses in right foot with necrosis of the 1st and second toe. Currently on empiric antibiotics, does not appear to be acutely infected. Will not be able to remove potential source of infection and now the 2nd digit appears to be more involved. WBC increased today at 12, remains afebrile. Will continue on IV antibiotics for now.  -As per orthopedics and vascular, patient would be high risk for  surgery given her anemia. -ABI shows non-compressible vessels in left and right lower extremities. -Continue vanc and zosynas empiric coverage -Tylenol PRN, oxycodone 5mg  for severe and breakthrough pain. -Palliative care following, added lidocaine patch and decreased oxycodone. Recommended home with home health and outpatient palliative care. Daughter, 100, will want to engage hospice at some point in the future. -Patient's status changed to DNR/DNI  Anemia: Hgb 6.8 at admission. VSS today. Patient is a witness and reported that she does not want blood products.As per her daughter, her Hgb tends to stay low. Hgb today is 7.  -givenone dose of IV feraheme on 03/14/20 -MMA pending -Vitamin B12 - 1,926 -Monitor Hgb  Malnutrition: -Nutrition consulted  Hypokalemia: -Mild hypokalemia (3.2) did not respond to TEFL teacher potassium PO on 07/15 -Given D5W with KCl 7/16 -K 5.1 today.   Mobitz type 2 s/p pacemaker: HR83. -EKG unchanged from priors  Prior to Admission Living Arrangement: Home Anticipated Discharge Location: TBD Barriers to Discharge: Continued medical work up  Dispo: Anticipated discharge in approximately 1-2 day(s).   , MD 03/17/2020, 6:25 AM Pager: [phone] After 5pm on weekdays and 1pm on weekends: On Call pager [phone]

## 2020-03-17 NOTE — Progress Notes (Signed)
Pharmacy Antibiotic Note  Betty [name] is a 86 y.o. female admitted on 03/14/2020 with gangrene of R great toe and critical limb ischemia. Pharmacy has been consulted for Vancomycin and Zosyn dosing.  Day #4 of antibiotics. Vancomycin 1 gm IV x 1 given on 7/14 at 1950, no standing order due to elevated creatinine, but expect dosing interval to be at least q48hrs. Patient remain afebrile, VR this AM ~6.    Plan: - Vancomycin 1g IV x1 - Vancomycin random ordered 48 hours after second dose, scheduled for 7/19 1000 (redose if <15 mcg/ml) - Continue Zosyn to 2.25 gm IV q8h - Follow renal function, culture data, clinical progress and antibiotic plans.  Height: 5\' 1"  (154.9 cm) Weight: 38.6 kg (85 lb) IBW/kg (Calculated) : 47.8  Temp (24hrs), Avg:98.2 F (36.8 C), Min:97.4 F (36.3 C), Max:98.7 F (37.1 C)  Recent Labs  Lab 03/14/20 1547 03/15/20 0455 03/16/20 0730 03/17/20 0743  WBC 9.1 9.7 10.1 12.1*  CREATININE 1.14* 1.22* 1.30* 1.28*  LATICACIDVEN 1.5  --   --   --   VANCORANDOM  --   --   --  6    Estimated Creatinine Clearance: 13.2 mL/min (A) (by C-G formula based on SCr of 1.28 mg/dL (H)).    Allergies  Allergen Reactions  . Lipitor [Atorvastatin] Other (See Comments)    Made the patient's throat burn    Antimicrobials this admission: Vancomycin 7/14 >> Zosyn 7/14 >>  Dose adjustments this admission: 7/16: random Vanc level tonight (~48 hrs after initial dose)  Microbiology results: 7/14 BCx x2: NGTD 7/14 COVID: negative     Dior Stepter L. 8/14, PharmD [facility] PGY2 Pharmacy Resident [phone] 03/17/20      9:30 AM  Please check AMION for all [facility] Pharmacy phone numbers After 10:00 PM, call the Main Pharmacy [phone]

## 2020-03-18 DIAGNOSIS — Z66 Do not resuscitate: Secondary | ICD-10-CM | POA: Diagnosis not present

## 2020-03-18 DIAGNOSIS — I998 Other disorder of circulatory system: Secondary | ICD-10-CM | POA: Diagnosis not present

## 2020-03-18 DIAGNOSIS — Z515 Encounter for palliative care: Secondary | ICD-10-CM | POA: Diagnosis not present

## 2020-03-18 LAB — BASIC METABOLIC PANEL
Anion gap: 5 (ref 5–15)
BUN: 12 mg/dL (ref 8–23)
CO2: 24 mmol/L (ref 22–32)
Calcium: 7.6 mg/dL — ABNORMAL LOW (ref 8.9–10.3)
Chloride: 104 mmol/L (ref 98–111)
Creatinine, Ser: 1.19 mg/dL — ABNORMAL HIGH (ref 0.44–1.00)
GFR calc Af Amer: 42 mL/min — ABNORMAL LOW (ref >60)
GFR calc non Af Amer: 37 mL/min — ABNORMAL LOW (ref >60)
Glucose, Bld: 85 mg/dL (ref 70–99)
Potassium: 4.9 mmol/L (ref 3.5–5.1)
Sodium: 133 mmol/L — ABNORMAL LOW (ref 135–145)

## 2020-03-18 LAB — CBC
HCT: 21.7 % — ABNORMAL LOW (ref 36.0–46.0)
Hemoglobin: 6.6 g/dL — CL (ref 12.0–15.0)
MCH: 29.6 pg (ref 26.0–34.0)
MCHC: 30.4 g/dL (ref 30.0–36.0)
MCV: 97.3 fL (ref 80.0–100.0)
Platelets: 264 10*3/uL (ref 150–400)
RBC: 2.23 MIL/uL — ABNORMAL LOW (ref 3.87–5.11)
RDW: 23.5 % — ABNORMAL HIGH (ref 11.5–15.5)
WBC: 10.7 10*3/uL — ABNORMAL HIGH (ref 4.0–10.5)
nRBC: 1.5 % — ABNORMAL HIGH (ref 0.0–0.2)

## 2020-03-18 LAB — GLUCOSE, CAPILLARY: Glucose-Capillary: 74 mg/dL (ref 70–99)

## 2020-03-18 NOTE — Progress Notes (Signed)
Subjective: Examined patient at bedside today. Daughter was also in the room.  States she is feeling good. Reports her big toe was hurting yesterday but it stopped after receiving pain medication. Was able to sleep well. Eating breakfast right now.  Asked why her second toe was turning like her big toe. Informed her that the cause is because there is decreased blood flow to her toes.  Does not have any questions at this time   Objective:  Vital signs in last 24 hours: Vitals:   03/17/20 1352 03/17/20 1937 03/18/20 0336 03/18/20 0743  BP: 133/70 (!) 121/56 (!) 116/56 108/64  Pulse: 77 67 62 71  Resp: 16 18 17 16   Temp: 97.6 F (36.4 C) 98.7 F (37.1 C) 97.9 F (36.6 C) (!) 97.5 F (36.4 C)  TempSrc: Oral Oral Oral Oral  SpO2: 99% 98% 100% 98%  Weight:      Height:       Physical Exam: General: Pleasant elderly female, frail-appearing, NAD, sitting up in bed eating breakfast. CV: Regular rate and normal rhythm. Absent dorsalis pedis and posterior tibialis pulses in RLE, faint in LLE. 2+ radial pulses in bilateral UE. Pulmonary: Clear to auscultation bilaterally. No adventitious sounds noted. MSK: Right lower first digit necrotic/gangrenous up to base of toe. Right lower second digit becoming necrotic/gangrenous at the tip. No tenderness to palpation. Neuro: No sensation in areas of necrosis. Very slight feeling in area around necrosis. Sensation intact in 3rd, 4th, and 5th digits in RLE.  Assessment/Plan:  Principal Problem:   Critical lower limb ischemia Active Problems:   Toe necrosis (HCC)   Pressure injury of skin   Palliative care encounter   DNR (do not resuscitate)   AKI (acute kidney injury) (HCC)   Normocytic anemia  This is a 86 year old female, Jehovah's Witness,with a history of mobitz type 2 s/p pacemaker who presented with right great toe painnoted to havesignificant anemia andcritical limb ischemia.   Gangrene of right first toe with critical  limb ischemia: Recent balloon angioplasty. Concern at outpatient hospital about placing a stent since patient is a Jehovah's witnesswith severe anemia.Absentpulses in right foot with necrosis of the 1st and second toe. Currently on empiric antibiotics, does not appear to be acutely infected. Will not be able to remove potential source of infection and now the 2nd digit appears to be more involved. WBC decreased today to 10.7, remains afebrile. Will continue IV antibiotics for now.  -As per orthopedics and vascular, patient would be high risk for surgery given her anemia. -ABIshows non-compressible vessels in left and right lower extremities. -Continue vanc and zosynas empiric coverage -Tylenol PRN, oxycodone 5mg  for severe and breakthrough pain. -Palliative care following, added lidocaine patch and decreased oxycodone. Recommended home with home health and outpatient palliative care. Daughter, 100, will want to engage hospice at some point in the future. -Patient's status changed to DNR/DNI  Anemia: Hgb 6.8 at admission.VSS today. Patient is a witness and reported that she does not want blood products.As per her daughter, her Hgb tends to stay low. Hgb 6.6 today.  -givenone dose of IV feraheme on 03/14/20 -MMA pending -Vitamin B12 - 1,926 -Monitor Hgb  Malnutrition: -Nutrition consulted  Hypokalemia: -Mild hypokalemia (3.2)did not respond to TEFL teacher potassium PO on 07/15 -Given D5W with KCl 7/16 -K 4.9 today.   Mobitz type 2 s/p pacemaker: HR83. -EKG unchanged from priors  Prior to Admission Living Arrangement: Home Anticipated Discharge Location: TBD Barriers to Discharge: Continued  medical work up [employer]: Anticipated discharge in approximately 1-2 days.   Merrilyn Puma, MD 03/18/2020, 10:16 AM Pager: [phone] After 5pm on weekdays and 1pm on weekends: On Call pager [phone]

## 2020-03-18 NOTE — Progress Notes (Signed)
Daily Progress Note   Patient Name: Betty [name]       Date: 03/18/2020 DOB: 16-Jul-1915  Age: 86 y.o. MRN#: [id number] Attending Physician: Gust Rung, DO Primary Care Physician: Georganna Skeans, MD Admit Date: 03/14/2020  Reason for Consultation/Follow-up:    To discuss complex medical decision making related to patient's goals of care  Subjective: Patient awake and alert today.  Denies pain and is very cordial.  I spoke with [name] about how to be of assistance.  She indicated she wanted to get her mother home as soon as possible with home health nursing (the normal nursing) and palliative care.  [name] states they have used [facility] in the past and she was very satisfied with them.  She would like to engage them once again.  No other complaints.  Patient and daughter looking forward to discharge.  Assessment: Ischemic great right toe with areas of ischemia on second right toe.  Patient appears comfortable, alert and oriented   Patient Profile/HPI:  86 y.o. female  with past medical history of complete AV block s/p pace maker placement who is also a Jehovah's Witness and will not accept blood transfusions, was admitted on 03/14/2020 with an ischemic great right toe.  Her albumin is 1.9.  The patient was not a candidate for surgery and her family was not interested in pursuing surgery.  She is currently being treated with IV antibiotics.     Length of Stay: 4   Vital Signs: BP 108/64 (BP Location: Left Arm)   Pulse 71   Temp (!) 97.5 F (36.4 C) (Oral)   Resp 16   Ht 5\' 1"  (1.549 m)   Wt 38.6 kg   SpO2 98%   BMI 16.06 kg/m  SpO2: SpO2: 98 % O2 Device: O2 Device: Room Air O2 Flow Rate:         Palliative Assessment/Data: 30%     Palliative Care Plan     Recommendations/Plan: DC to home with oral antibiotics when medically stable. Will need palliative care outpatient follow-up in order to convert to hospice services when regular home health nursing is completed or the patient declines. I am concerned that her infection will return quickly as there is no resolution to the situation without surgery  Code Status:  DNR  Prognosis:  <  6 months, possibly much less if her infection recurs quickly  Discharge Planning: Home with Home Health and palliative care  Care plan was discussed with family  Thank you for allowing the Palliative Medicine Team to assist in the care of this patient.  Total time spent: 25 minutes     Greater than 50%  of this time was spent counseling and coordinating care related to the above assessment and plan.  Norvel Richards, PA-C Palliative Medicine  Please contact Palliative MedicineTeam phone at [phone] for questions and concerns between 7 am - 7 pm.   Please see AMION for individual provider pager numbers.

## 2020-03-19 LAB — CULTURE, BLOOD (ROUTINE X 2)
Culture: NO GROWTH
Culture: NO GROWTH
Special Requests: ADEQUATE
Special Requests: ADEQUATE

## 2020-03-19 LAB — GLUCOSE, CAPILLARY: Glucose-Capillary: 78 mg/dL (ref 70–99)

## 2020-03-19 LAB — VANCOMYCIN, RANDOM: Vancomycin Rm: 13

## 2020-03-19 LAB — METHYLMALONIC ACID, SERUM: Methylmalonic Acid, Quantitative: 190 nmol/L (ref 0–378)

## 2020-03-19 MED ORDER — VANCOMYCIN HCL 1250 MG/250ML IV SOLN
1250.0000 mg | Freq: Once | INTRAVENOUS | Status: AC
  Administered 2020-03-19: 1250 mg via INTRAVENOUS
  Filled 2020-03-19: qty 250

## 2020-03-19 NOTE — Progress Notes (Addendum)
Pharmacy Antibiotic Note  Betty [name] is a 86 y.o. female admitted on 03/14/2020 with gangrene of R great toe and critical limb ischemia. Pharmacy has been consulted for Vancomycin and Zosyn dosing.  Day #6 of antibiotics. Vancomycin 1 gm IV x 1 given on 7/14 at 1950 & x 1 given on 7/17 at 1056, no standing order due to elevated creatinine, but expect dosing interval to be at least q48hrs. Patient remain afebrile, VR this AM 13. Will increase vancomycin dose to target goal trough of 15-20.    Plan: - Vancomycin 1250 mg IV x1 - Vancomycin random ordered 48 hours after dose, scheduled for 7/21 1000 (redose if <20 mcg/ml) - Continue Zosyn to 2.25 gm IV q8h - Follow renal function, culture data, clinical progress and antibiotic plans.  Height: 5\' 1"  (154.9 cm) Weight: 38.6 kg (85 lb) IBW/kg (Calculated) : 47.8  Temp (24hrs), Avg:98 F (36.7 C), Min:97.9 F (36.6 C), Max:98.2 F (36.8 C)  Recent Labs  Lab 03/14/20 1547 03/15/20 0455 03/16/20 0730 03/17/20 0743 03/18/20 0235 03/19/20 0955  WBC 9.1 9.7 10.1 12.1* 10.7*  --   CREATININE 1.14* 1.22* 1.30* 1.28* 1.19*  --   LATICACIDVEN 1.5  --   --   --   --   --   VANCORANDOM  --   --   --  6  --  13    Estimated Creatinine Clearance: 14.2 mL/min (A) (by C-G formula based on SCr of 1.19 mg/dL (H)).    Allergies  Allergen Reactions   Lipitor [Atorvastatin] Other (See Comments)    Made the patient's throat burn    Antimicrobials this admission: Vancomycin 7/14 x 1, 7/17 x 1, 7/19 x 1 >> Zosyn 7/14 >>  Dose adjustments this admission: 7/17: Vanc random 6 mcg/mL  7/19: Vanc random 13 mcg/mL   Microbiology results: 7/14 BCx x2: NGTD 7/14 COVID: negative   8/14, PharmD PGY1 Pharmacy Resident 03/19/2020 11:14 AM  Please check [email or web address] for unit-specific pharmacy phone numbers.

## 2020-03-19 NOTE — Plan of Care (Signed)

## 2020-03-19 NOTE — Progress Notes (Signed)
Subjective: Examined patient at bedside.  Reports not doing too good today. She had pain in her foot that kept her up a lot last night. She reports she is alright now and not in any pain.   Reports eating well.  Discussed about discharge today and home hospice, will discuss further once daughter arrives  Discussed second toe looking more like the great toe. She states she doesn't want her toe cut off and stated that due to her low blood count that surgery is not an option currently and we recommend against it.   Spoke with patient and daughter in the afternoon about home hospice in order to allow for the patient to be as comfortable as possible. Daughter reports that is what they want, for her mother to be comfortable. Agreed to going home with home hospice.    Objective:  Vital signs in last 24 hours: Vitals:   03/18/20 0743 03/18/20 1413 03/18/20 1955 03/19/20 0603  BP: 108/64 110/65 (!) 107/53 137/62  Pulse: 71 72 89 71  Resp: 16 16 16 15   Temp: (!) 97.5 F (36.4 C) 97.9 F (36.6 C) 98 F (36.7 C) 98.2 F (36.8 C)  TempSrc: Oral Oral Oral Oral  SpO2: 98% 97% 100% 100%  Weight:      Height:       Physical Exam: General: Pleasant elderly female, laying in bed, resting comfortably CV: Regular rate and normal rhythm. Absent dorsalis pedis and posterior tibialis pulses in RLE, faint in LLE. 2+ radial pulses bilaterally. Pulm: CTABL, no adventitious sounds noted. MSK: Right lower first digit necrotic/gangrenous up to base of toe. Right lower second digit more necrotic/gangrenous than yesterday. No tenderness to palpation. Neuro: No sensation in areas of necrosis. Sensation intact in 3rd, 4th, and 5th digits in RLE and the rest of foot.   Assessment/Plan:  Principal Problem:   Critical lower limb ischemia Active Problems:   Toe necrosis (HCC)   Pressure injury of skin   Palliative care encounter   DNR (do not resuscitate)   AKI (acute kidney injury) (HCC)   Normocytic  anemia  Patient is a 86 year old female, Jehovah's Witness,with a history of mobitz type 2 s/p pacemaker who presented with right great toe painnoted to havesignificant anemia andcritical limb ischemia. Will be discharged tomorrow with home hospice.  Gangrene of right first and second toes with critical limb ischemia: Recent balloon angioplasty. Concern at outpatient hospital about placing a stent since patient is a Jehovah's witnesswith severe anemia.Absentpulses in right foot with necrosis of the 1st and second toe.Currently on empiric antibiotics, does not appear to be acutely infected. Will not be able to remove potential source of infection and now the 2nd digit appears to be more involved. WBC 10.7, remains afebrile. Will continue IV antibiotics for now. -As per orthopedics and vascular, patient would be high risk for surgery given her anemia. -ABIshows non-compressible vessels in left and right lower extremities. -Continue vanc and zosynas empiric coverage, will stop at discharge. -Tylenol PRN, oxycodone 5mg  for severe and breakthrough pain. -Patient's status changed to DNR/DNI -Palliativecare following, added lidocaine patch and decreased oxycodone. Recommended home hospice. Daughter, 89, agreeable to home hospice after discharge. -Earliest possibility for home hospice is tomorrow (03/19/20) so will keep patient overnight  Anemia: Hgb 6.8 at admission.VSS today. Patient is a [name] witness and reported that she does not want blood products.As per her daughter, her Hgb tends to stay low. Most recent Hgb 6.6. -givenone dose of IV feraheme on 03/14/20 -  MMApending -Vitamin B12 - 1,926  Malnutrition: -Nutrition consulted  Hypokalemia: -Mild hypokalemia (3.2)did not respond to potassium PO on 07/15 -GivenD5W with KCl 7/16 -Potassium repleted, most recent K 4.9  Mobitz type 2 s/p pacemaker: HR83. -EKG unchanged from priors   Prior to  Admission Living Arrangement: Home Anticipated Discharge Location: Home with Home Hospice Barriers to Discharge: Earliest possibility for home hospice is tomorrow Dispo: Anticipated discharge in approximately 1 day.   Merrilyn Puma, MD 03/19/2020, 2:51 PM Pager: [phone] After 5pm on weekdays and 1pm on weekends: On Call pager [phone]

## 2020-03-20 LAB — GLUCOSE, CAPILLARY
Glucose-Capillary: <10 mg/dL — CL (ref 70–99)
Glucose-Capillary: <10 mg/dL — CL (ref 70–99)
Glucose-Capillary: 76 mg/dL (ref 70–99)

## 2020-03-20 MED ORDER — OXYCODONE HCL 5 MG PO TABS: 5.0000 mg | ORAL_TABLET | ORAL | 0 refills | Status: AC | PRN

## 2020-03-20 MED ORDER — LIDOCAINE 5 % EX PTCH: 1.0000 | MEDICATED_PATCH | CUTANEOUS | 0 refills | Status: AC

## 2020-03-20 NOTE — Care Management Important Message (Signed)
Important Message  Patient Details  Name: Betty [name] MRN: [id number] Date of Birth: 04-Jun-1915   Medicare Important Message Given:  Yes  Patient left prior to IM delivery.  IM mailed to the patient home address.  [name] 03/20/2020, 3:46 PM

## 2020-03-20 NOTE — Progress Notes (Deleted)
Name: Betty [name] MRN: [id number] DOB: 1915-05-25 86 y.o. PCP: Georganna Skeans, MD  Date of Admission: 03/14/2020  3:12 PM Date of Discharge: 03/20/20 Attending Physician: Reymundo Poll, MD  Discharge Diagnosis: 1. Critical limb ischemia (with resultant gangrene of R first and second toes)  2. Anemia  Discharge Medications: Allergies as of 03/20/2020      Reactions   Lipitor [atorvastatin] Other (See Comments)   Made the patient's throat burn      Medication List    STOP taking these medications   atorvastatin 40 MG tablet Commonly known as: LIPITOR     TAKE these medications   acetaminophen 325 MG tablet Commonly known as: TYLENOL Take 650 mg by mouth every 6 (six) hours as needed for mild pain.   ascorbic acid 500 MG tablet Commonly known as: VITAMIN C Take 500 mg by mouth daily.   Ensure Plus Liqd Take 237 mLs by mouth 2 (two) times daily between meals. What changed: Another medication with the same name was removed. Continue taking this medication, and follow the directions you see here.   ferrous sulfate 325 (65 FE) MG tablet Take 325 mg by mouth daily with breakfast.   lidocaine 5 % Commonly known as: LIDODERM Place 1 patch onto the skin daily. Remove & Discard patch within 12 hours or as directed by MD   oxyCODONE 5 MG immediate release tablet Commonly known as: Oxy IR/ROXICODONE Take 1 tablet (5 mg total) by mouth every 4 (four) hours as needed for up to 5 days for severe pain or breakthrough pain.       Disposition and follow-up:   Ms.Jolicia R Esty was discharged from [facility] in Serious condition.  At the hospital follow up visit please address:  1.  Critical limb ischemia. Anemia. R first and second toes gangrenous/necrotic. Absent pulses in RLE. Hgb 6.6 at discharge. 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 As per orthopedics and vascular, patient high risk for surgery  given her anemia. Discharged with tylenol prn for mild pain, oxycodone 5mg  for severe and breakthrough pain, and lidocaine patch. Discharged home with home hospice. Patient is DNR/DNI.  2.  Labs / imaging needed at time of follow-up: CBC, CMP  3.  Pending labs/ test needing follow-up: None  Follow-up Appointments:  Follow-up Information    , MD Follow up.   Specialty: Orthopedic Surgery Why: Call for follow up appointment Contact information: [address] [place] [phone]        [facility] up.   Contact information:   Address: [address] [place], [place] Phone: [phone]              Hospital Course by problem list: 1. Critical limb ischemia. Anemia. Recent balloon angioplasty at outside hospital. Concern at outpatient hospital about placing stent since patient is a Jehovah's Witness with severe anemia. On admission, absent pulses in right foot with gangrene of right first and second toes. Hgb 6.8 on admission and Hgb 6.6 at discharge. Given empiric antibiotics with vanc/zosyn. Given one dose of IV feraheme. Vitamin B12 at 1,926, MMA 190. Throughout hospital course, patient did not appear to be infected. Remained afebrile. Ortho and vascular consulted, stated patient would be high risk for surgery given her anemia. ABI showed non-compressible vessels in left and right lower extremities. Nutrition consulted. Managed pain with tylenol prn, oxycodone 5mg  for severe  and breakthrough pain, and lidocaine patch. Patients status was switched from full code to DNR/DNI as per family's wishes after extensive code status discussion with patient and daughter. Palliative care consulted, recommended home hospice. Patient discharged with home hospice (starting tomorrow) and tylenol prn, oxycodone 5mg , and lidocaine patch.  Discharge Vitals:   BP 121/70 (BP Location: Right Arm)   Pulse 77   Temp 98.5 F (36.9 C) (Axillary)   Resp 15    Ht 5\' 1"  (1.549 m)   Wt 38.6 kg   SpO2 100%   BMI 16.06 kg/m   Pertinent Labs, Studies, and Procedures:  CBC Latest Ref Rng & Units 03/18/2020 03/17/2020 03/16/2020  WBC 4.0 - 10.5 K/uL 10.7(H) 12.1(H) 10.1  Hemoglobin 12.0 - 15.0 g/dL 6.6(LL) 7.0(L) 6.6(LL)  Hematocrit 36 - 46 % 21.7(L) 23.7(L) 22.3(L)  Platelets 150 - 400 K/uL 264 248 280   CMP Latest Ref Rng & Units 03/18/2020 03/17/2020 03/16/2020  Glucose 70 - 99 mg/dL 85 03/19/2020) 03/18/2020)  BUN 8 - 23 mg/dL 12 13 16   Creatinine 0.44 - 1.00 mg/dL 284(X) 324(M) )  Sodium 135 - 145 mmol/L 133(L) 138 145  Potassium 3.5 - 5.1 mmol/L 4.9 5.1 3.2(L)  Chloride 98 - 111 mmol/L 104 108 114(H)  CO2 22 - 32 mmol/L 24 21(L) 23  Calcium 8.9 - 10.3 mg/dL 7.6(L) 7.6(L) 7.7(L)  Total Protein 6.5 - 8.1 g/dL - - -  Total Bilirubin 0.3 - 1.2 mg/dL - - -  Alkaline Phos 38 - 126 U/L - - -  AST 15 - 41 U/L - - -  ALT 0 - 44 U/L - - -   Blood Culture    Component Value Date/Time   SDES BLOOD SITE NOT SPECIFIED 03/14/2020 1635   SPECREQUEST  03/14/2020 1635    BOTTLES DRAWN AEROBIC AND ANAEROBIC Blood Culture adequate volume   CULT  03/14/2020 1635    NO GROWTH 5 DAYS Performed at [facility], [address] [place], [facility] [address]    REPTSTATUS 03/19/2020 FINAL 03/14/2020 1635    Imaging:   RIGHT FOOT COMPLETE - 3+ VIEW IMPRESSION: 1. Soft tissue thinning at the distal margin of the first and second digits, compatible with given history of discoloration and soft tissue necrosis. Possible subcutaneous gas at the distal tuft of the first digit. 2. Diffuse osteopenia.  No acute or destructive bony lesions.   LOWER EXTREMITY DOPPLER STUDY  Summary:  Right: Resting right ankle-brachial index indicates noncompressible right  lower extremity arteries.   Left: Resting left ankle-brachial index indicates noncompressible left  lower extremity arteries.    Discharge Instructions: Discharge Instructions    Call MD for:   difficulty breathing, headache or visual disturbances   Complete by: As directed    Call MD for:  extreme fatigue   Complete by: As directed    Call MD for:  hives   Complete by: As directed    Call MD for:  persistant dizziness or light-headedness   Complete by: As directed    Call MD for:  persistant nausea and vomiting   Complete by: As directed    Call MD for:  redness, tenderness, or signs of infection (pain, swelling, redness, odor or green/yellow discharge around incision site)   Complete by: As directed    Call MD for:  severe uncontrolled pain   Complete by: As directed    Call MD for:  temperature >100.4   Complete by: As directed    Discharge  instructions   Complete by: As directed    Ms. Mattes, it was my pleasure to take care of you during your time here. You came in with right toe and second toe pain and were found to have an infection of your big toe and second toe. We were unable to perform surgery on your foot because of your low blood counts and because your religious beliefs are against getting someone else's blood.   Therefore, we are sending you home with home hospice, who will be able to come to your home tomorrow. Please take care and, once again, it was my pleasure taking care of you.   Increase activity slowly   Complete by: As directed    No wound care   Complete by: As directed       Signed: Merrilyn Puma, MD 03/20/2020, 5:18 PM   Pager: [phone]

## 2020-03-20 NOTE — Progress Notes (Signed)
A discharge packet printed and provided to the patient's daughter [name])  Discharge instructions were reviewed.  The patient will be discharged to the home with [facility].

## 2020-03-20 NOTE — TOC Transition Note (Signed)
Transition of Care [facility]) - CM/SW Discharge Note   Patient Details  Name: Betty [name] MRN: [id number] Date of Birth: 1915/05/23  Transition of Care [facility] CM/SW Contact:  Betty Lesches, RN Phone Number: 03/20/2020, 10:10 AM   Clinical Narrative:    86 y/o admitted with gangrene of R great toe and critical limb ischemia. From home with family. Per MD pt will transition to home today. NCM received consult for home hospice care. Pt agreeable to home hospice care. [facility] and accepted after choice given to pt/ daughter,Betty [name].  Pt will d/c to home today.  Per admission liaison  Betty [name] @ [facility] & hospice  intake / assessment will be done by nurse, 7/21.  Daughter Betty [name] will provide  transportation to home by car.     Final next level of care: Home w Hospice Care Barriers to Discharge: No Barriers Identified   Patient Goals and CMS Choice     Choice offered to / list presented to : Patient, Adult Children Betty [name])  Discharge Placement                       Discharge Plan and Services                DME Arranged: N/A DME Agency: NA         HH Agency: [facility] Date [facility] Agency Contacted: 03/20/20 Time HH Agency Contacted: 1009 Representative spoke with at [facility] Agency: Betty [name]  Social Determinants of Health (SDOH) Interventions     Readmission Risk Interventions No flowsheet data found.

## 2020-03-20 NOTE — Care Management Important Message (Signed)
Important Message  Patient Details  Name: Betty [name] MRN: [id number] Date of Birth: Aug 06, 1915   Medicare Important Message Given:  Yes     Dorena Bodo 03/20/2020, 3:45 PM

## 2020-03-20 NOTE — Progress Notes (Signed)
Subjective: Examined patient at bedside.  States that she was in a lot of pain earlier from her big toe and second toe hurting. Reports that she feels fine now.  Reports eating well.  Discussed with patient and daughter about discharge today with home hospice. Daughter states that home hospice will not be able to come until tomorrow, but that she would like to take patient home today as her brother and her son will be there to care for her. Daughter asks if pain medications can be sent with them for when patient is in pain. Informed them that we will discharge her with the appropriate pain medications.  All questions and concerns were addressed.  Objective:  Vital signs in last 24 hours: Vitals:   03/19/20 1517 03/19/20 1951 03/20/20 0211 03/20/20 0814  BP: (!) 149/59 136/72 119/66 121/70  Pulse: 74 79 75 77  Resp: 17 17 16 15   Temp: 98.5 F (36.9 C) 97.9 F (36.6 C) 98.3 F (36.8 C) 98.5 F (36.9 C)  TempSrc: Oral Oral Oral Axillary  SpO2: 100% 98% 100% 100%  Weight:      Height:       Physical Exam: General: Pleasant elderly female, laying in bed, resting comfortably CV: Regular rate and normal rhythm. Absent dorsalis pedis and posterior tibialis pulses in RLE, faint in LLE. 2+ radial pulses bilaterally. Pulm: CTABL, no adventitious sounds noted. MSK: Right lower first digit necrotic/gangrenous up to base of toe. Right lower second digit more necrotic/gangrenous than yesterday. No tenderness to palpation. Neuro: No sensation in areas of necrosis. Sensation intact in 3rd, 4th, and 5th digits in RLE and the rest of foot.   Assessment/Plan:  Principal Problem:   Critical lower limb ischemia Active Problems:   Toe necrosis (HCC)   Pressure injury of skin   Palliative care encounter   DNR (do not resuscitate)   AKI (acute kidney injury) (HCC)   Normocytic anemia  Patient is a 86 year old female, Jehovah's Witness,with a history of mobitz type 2 s/p pacemaker who  presented with right great toe painnoted to havesignificant anemia andcritical limb ischemia. Will be discharged today. Home hospice will provide care starting tomorrow.  Gangrene of right first and second toes with critical limb ischemia: Recent balloon angioplasty. Concern at outpatient hospital about placing a stent since patient is a Jehovah's witnesswith severe anemia.Absentpulses in right foot with necrosis of the 1st and second toe.Currently on empiric antibiotics, does not appear to be acutely infected. Will not be able to remove potential source of infection and now the 2nd digit appears to be more involved. WBC 10.7, remains afebrile. Will continue IV antibiotics for now. -As per orthopedics and vascular, patient would be high risk for surgery given her anemia. -ABIshows non-compressible vessels in left and right lower extremities. -Stopped vanc and zosyn -Tylenol PRN, oxycodone 5mg  for severe and breakthrough pain. -Patient's status changed to DNR/DNI -Palliativecare following, added lidocaine patch and decreased oxycodone. Recommended home hospice. Daughter, 89, agreeable to home hospice after discharge. -Will discharge today as per patient and patient's daughter -Home hospice will provide care from tomorrow (03/21/20)  Anemia: Hgb 6.8 at admission.VSS today. Patient is a [name] witness and reported that she does not want blood products.As per her daughter, her Hgb tends to stay low.Most recent Hgb 6.6. -givenone dose of IV feraheme on 03/14/20 -MMA190 -Vitamin B12 - 1,926  Malnutrition: -Nutrition consulted  Hypokalemia: -Mild hypokalemia (3.2)did not respond to 03/16/20 potassium PO on 07/15 -GivenD5W with KCl 7/16 -  Potassium repleted, most recent K 4.9  Mobitz type 2 s/p pacemaker: HR83. -EKG unchanged from priors   Prior to Admission Living Arrangement: Home Anticipated Discharge Location: Home with home hospice Barriers to  Discharge: None Dispo: Anticipated discharge in approximately today.   Merrilyn Puma, MD 03/20/2020, 1:00 PM Pager: [phone] After 5pm on weekdays and 1pm on weekends: On Call pager [phone]

## 2020-03-20 NOTE — Plan of Care (Signed)

## 2020-03-21 NOTE — Discharge Summary (Signed)
Name: Betty [name] MRN: [id number] DOB: Oct 19, 1914 86 y.o. PCP: Georganna Skeans, MD  Date of Admission: 03/14/2020  3:12 PM Date of Discharge: 03/20/20 Attending Physician: Reymundo Poll, MD  Discharge Diagnosis: 1. Critical limb ischemia (with resultant gangrene of R first and second toes)  2. Anemia  Discharge Medications:      Allergies as of 03/20/2020      Reactions   Lipitor [atorvastatin] Other (See Comments)   Made the patient's throat burn         Medication List    STOP taking these medications   atorvastatin 40 MG tablet Commonly known as: LIPITOR     TAKE these medications   acetaminophen 325 MG tablet Commonly known as: TYLENOL Take 650 mg by mouth every 6 (six) hours as needed for mild pain.   ascorbic acid 500 MG tablet Commonly known as: VITAMIN C Take 500 mg by mouth daily.   Ensure Plus Liqd Take 237 mLs by mouth 2 (two) times daily between meals. What changed: Another medication with the same name was removed. Continue taking this medication, and follow the directions you see here.   ferrous sulfate 325 (65 FE) MG tablet Take 325 mg by mouth daily with breakfast.   lidocaine 5 % Commonly known as: LIDODERM Place 1 patch onto the skin daily. Remove & Discard patch within 12 hours or as directed by MD   oxyCODONE 5 MG immediate release tablet Commonly known as: Oxy IR/ROXICODONE Take 1 tablet (5 mg total) by mouth every 4 (four) hours as needed for up to 5 days for severe pain or breakthrough pain.       Disposition and follow-up:   Ms.Betty [name] was discharged from [facility] in Serious condition.  At the hospital follow up visit please address:  1.  Critical limb ischemia. Anemia. R first and second toes gangrenous/necrotic. Absent pulses in RLE. Hgb 6.6 at discharge. 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 As per orthopedics and vascular,  patient high risk for surgery given her anemia. Discharged with tylenol prn for mild pain, oxycodone 5mg  for severe and breakthrough pain, and lidocaine patch. Discharged home with home hospice. Patient is DNR/DNI.  2.  Labs / imaging needed at time of follow-up: CBC, CMP  3.  Pending labs/ test needing follow-up: None  Follow-up Appointments:      Follow-up Information        , MD Follow up.   Specialty: Orthopedic Surgery Why: Call for follow up appointment Contact information: [address] [place] [phone]             [facility].   Contact information:   Address: [address] [place], [place] Phone: [phone]               Hospital Course by problem list: 1. Critical limb ischemia. Anemia. Recent balloon angioplasty at outside hospital. Concern at outpatient hospital about placing stent since patient is a Jehov[employer]'s Witness with severe anemia. On admission, absent pulses in right foot with gangrene of right first and second toes. Hgb 6.8 on admission and Hgb 6.6 at discharge. Given empiric antibiotics with vanc/zosyn. Given one dose of IV feraheme. Vitamin B12 at 1,926, MMA 190. Throughout hospital course, patient did not appear to be infected. Remained afebrile. Ortho and vascular consulted, stated patient would be high risk for surgery given her  anemia. ABI showed non-compressible vessels in left and right lower extremities. Nutrition consulted. Managed pain with tylenol prn, oxycodone 5mg  for severe and breakthrough pain, and lidocaine patch. Patients status was switched from full code to DNR/DNI as per family's wishes after extensive code status discussion with patient and daughter. Palliative care consulted, recommended home hospice. Patient discharged with home hospice (starting tomorrow) and tylenol prn, oxycodone 5mg , and lidocaine patch.  Discharge Vitals:   BP 121/70 (BP Location: Right  Arm)   Pulse 77   Temp 98.5 F (36.9 C) (Axillary)   Resp 15   Ht 5\' 1"  (1.549 m)   Wt 38.6 kg   SpO2 100%   BMI 16.06 kg/m   Pertinent Labs, Studies, and Procedures:  CBC Latest Ref Rng & Units 03/18/2020 03/17/2020 03/16/2020  WBC 4.0 - 10.5 K/uL 10.7(H) 12.1(H) 10.1  Hemoglobin 12.0 - 15.0 g/dL 6.6(LL) 7.0(L) 6.6(LL)  Hematocrit 36 - 46 % 21.7(L) 23.7(L) 22.3(L)  Platelets 150 - 400 K/uL 264 248 280   CMP Latest Ref Rng & Units 03/18/2020 03/17/2020 03/16/2020  Glucose 70 - 99 mg/dL 85 03/20/2020) 03/19/2020)  BUN 8 - 23 mg/dL 12 13 16   Creatinine 0.44 - 1.00 mg/dL 03/18/2020) 945(W) 388(E)  Sodium 135 - 145 mmol/L 133(L) 138 145  Potassium 3.5 - 5.1 mmol/L 4.9 5.1 3.2(L)  Chloride 98 - 111 mmol/L 104 108 114(H)  CO2 22 - 32 mmol/L 24 21(L) 23  Calcium 8.9 - 10.3 mg/dL 7.6(L) 7.6(L) 7.7(L)  Total Protein 6.5 - 8.1 g/dL - - -  Total Bilirubin 0.3 - 1.2 mg/dL - - -  Alkaline Phos 38 - 126 U/L - - -  AST 15 - 41 U/L - - -  ALT 0 - 44 U/L - - -   Blood Culture Labs (Brief)           Component Value Date/Time   SDES BLOOD SITE NOT SPECIFIED 03/14/2020 1635   SPECREQUEST  03/14/2020 1635    BOTTLES DRAWN AEROBIC AND ANAEROBIC Blood Culture adequate volume   CULT  03/14/2020 1635    NO GROWTH 5 DAYS Performed at [facility], [address], [place], 03/16/2020 [facility]    REPTSTATUS 03/19/2020 FINAL 03/14/2020 1635      Imaging:   RIGHT FOOT COMPLETE - 3+ VIEW IMPRESSION: 1. Soft tissue thinning at the distal margin of the first and second digits, compatible with given history of discoloration and soft tissue necrosis. Possible subcutaneous gas at the distal tuft of the first digit. 2. Diffuse osteopenia. No acute or destructive bony lesions.   LOWER EXTREMITY DOPPLER STUDY  Summary:  Right: Resting right ankle-brachial index indicates noncompressible right  lower extremity arteries.   Left: Resting left ankle-brachial index indicates noncompressible  left  lower extremity arteries.    Discharge Instructions:     Discharge Instructions    Call MD for:  difficulty breathing, headache or visual disturbances   Complete by: As directed    Call MD for:  extreme fatigue   Complete by: As directed    Call MD for:  hives   Complete by: As directed    Call MD for:  persistant dizziness or light-headedness   Complete by: As directed    Call MD for:  persistant nausea and vomiting   Complete by: As directed    Call MD for:  redness, tenderness, or signs of infection (pain, swelling, redness, odor or green/yellow discharge around incision site)   Complete by: As  directed    Call MD for:  severe uncontrolled pain   Complete by: As directed    Call MD for:  temperature >100.4   Complete by: As directed    Discharge instructions   Complete by: As directed    Ms. Feasel, it was my pleasure to take care of you during your time here. You came in with right toe and second toe pain and were found to have an infection of your big toe and second toe. We were unable to perform surgery on your foot because of your low blood counts and because your religious beliefs are against getting someone else's blood.   Therefore, we are sending you home with home hospice, who will be able to come to your home tomorrow. Please take care and, once again, it was my pleasure taking care of you.   Increase activity slowly   Complete by: As directed    No wound care   Complete by: As directed       Signed: Merrilyn Puma, MD 03/20/2020, 5:18 PM   Pager: [phone]

## 2020-03-23 ENCOUNTER — Telehealth: Payer: Self-pay

## 2020-03-23 NOTE — Telephone Encounter (Signed)
Tried to call pt's daughter and th VM is full was not able to leave a message will hold and try again later.

## 2020-03-23 NOTE — Telephone Encounter (Signed)
This is a patient that Dr. Lajoyce Corners saw in consult on 03/15/20 for a gangrenous right GT. Pt is not a surgical candidate at the moment and he wanted her to increase her protein intake and her hemoglobin to raise some had advised would need BKA possible AKA but for now daughter is asking for stronger pain medication. Being that she was in seen in consult once I am not sure if this is something for [place] to do. She is also 86 years old.

## 2020-03-23 NOTE — Telephone Encounter (Signed)
Any pain medication would have to be managed through her primary care

## 2020-03-23 NOTE — Telephone Encounter (Signed)
Tried to reach pt's daughter again and the vm is full unable to leave message. Will hold and try again.

## 2020-03-23 NOTE — Telephone Encounter (Signed)
Patient daughter called in wanting to know if she can provide her mother will stronger medication . Says she gave her tylenol at 8am this morning .

## 2020-03-27 ENCOUNTER — Telehealth: Payer: Self-pay | Admitting: Orthopedic Surgery

## 2020-03-27 NOTE — Telephone Encounter (Signed)
This is a duplicate message.

## 2020-03-27 NOTE — Telephone Encounter (Signed)
Received call from [facility] (Daughter) asking about the Oxycodone. [name] said her mother is hallucinating after she takes the medication. Patient was taking 1 every 4 hours for pain. The number to contact [name] is [phone]

## 2020-03-27 NOTE — Telephone Encounter (Signed)
I called and sw pt's daughter advised that I had been trying to reach for for several days but her mail box is full and I was unable to leave a message. Advised that Dr. Lajoyce Corners saw her one time in consult while she was in the hospital and so that we could not give rx for medication but that she should follow  up with her PCP and have them make adjustment for her. We are happy to see her at the office but daughter states they are not ready to do that at this time. To call with any other questions or concerns.

## 2020-04-05 ENCOUNTER — Inpatient Hospital Stay: Payer: Medicare PPO | Admitting: Orthopedic Surgery

## 2020-04-26 ENCOUNTER — Telehealth: Payer: Self-pay | Admitting: Internal Medicine

## 2020-04-26 NOTE — Telephone Encounter (Signed)
Patient's daughter is wanting to know if they can do a home remote pacer check while the patient is under hospice, and if so, can someone direct her on how to set up the machine. Please call.

## 2020-04-30 NOTE — Telephone Encounter (Signed)
Returned phone call. Mailbox is full, unable to leave VM.

## 2020-05-01 ENCOUNTER — Telehealth: Payer: Self-pay | Admitting: Internal Medicine

## 2020-05-01 NOTE — Telephone Encounter (Signed)
Noted  

## 2020-05-01 NOTE — Telephone Encounter (Signed)
New Message:    Daughter called and wanted Dr Ladona Ridgel to know pt passed away this weekend. She also wanted to thank Dr Ladona Ridgel and his staff for all their service and kindness through the years.

## 2020-05-01 NOTE — Telephone Encounter (Signed)
I will forward call to Dr. Ladona Ridgel and his nurse Richarda Osmond RN.

## 2020-05-02 NOTE — Telephone Encounter (Signed)
Returned phone call . Unable to leave message , VM full.

## 2020-05-02 DEATH — deceased

## 2020-05-04 NOTE — Telephone Encounter (Signed)
Called in regards to monitor box. Spoke to [employer], daughter, states Betty [name] passed away last 12/19/22. Offered my condolences and prayers. Advised if we can help them in any way to please call us.   Unable to locate [employer] home remote monitor box.  Removed from [facility] and marked E in [employer].

## 2020-05-10 ENCOUNTER — Telehealth: Payer: Self-pay | Admitting: Orthopedic Surgery
